# Patient Record
Sex: Male | Born: 1980 | Race: White | Marital: Single | Smoking: Never smoker
Health system: Southern US, Community
[De-identification: ages and names within clinical notes are randomized; demographics above are authoritative.]

## PROBLEM LIST (undated history)

## (undated) DIAGNOSIS — I10 Essential (primary) hypertension: Secondary | ICD-10-CM

## (undated) DIAGNOSIS — G834 Cauda equina syndrome: Secondary | ICD-10-CM

---

## 2011-11-20 ENCOUNTER — Other Ambulatory Visit: Payer: Self-pay | Admitting: Family Medicine

## 2011-11-25 ENCOUNTER — Ambulatory Visit
Admission: RE | Admit: 2011-11-25 | Discharge: 2011-11-25 | Disposition: A | Discharge status: Home / Self Care | Payer: 59 | Source: Ambulatory Visit | Attending: Family Medicine | Admitting: Family Medicine

## 2013-07-08 ENCOUNTER — Emergency Department (HOSPITAL_COMMUNITY): Payer: 59

## 2013-07-08 ENCOUNTER — Emergency Department (HOSPITAL_COMMUNITY)
Admission: EM | Admit: 2013-07-08 | Discharge: 2013-07-08 | Disposition: A | Discharge status: Home / Self Care | Payer: 59 | Attending: Emergency Medicine | Admitting: Emergency Medicine

## 2013-07-08 ENCOUNTER — Encounter (HOSPITAL_COMMUNITY): Payer: Self-pay | Admitting: Emergency Medicine

## 2013-07-08 DIAGNOSIS — N50811 Right testicular pain: Secondary | ICD-10-CM

## 2013-07-08 DIAGNOSIS — Z79899 Other long term (current) drug therapy: Secondary | ICD-10-CM | POA: Insufficient documentation

## 2013-07-08 DIAGNOSIS — R319 Hematuria, unspecified: Secondary | ICD-10-CM | POA: Insufficient documentation

## 2013-07-08 DIAGNOSIS — N509 Disorder of male genital organs, unspecified: Secondary | ICD-10-CM | POA: Insufficient documentation

## 2013-07-08 DIAGNOSIS — I10 Essential (primary) hypertension: Secondary | ICD-10-CM | POA: Insufficient documentation

## 2013-07-08 HISTORY — DX: Essential (primary) hypertension: I10

## 2013-07-08 LAB — COMPREHENSIVE METABOLIC PANEL
ALT: 46 U/L (ref 0–53)
AST: 44 U/L — ABNORMAL HIGH (ref 0–37)
Albumin: 4.6 g/dL (ref 3.5–5.2)
Alkaline Phosphatase: 77 U/L (ref 39–117)
BUN: 14 mg/dL (ref 6–23)
CALCIUM: 10.1 mg/dL (ref 8.4–10.5)
CO2: 23 meq/L (ref 19–32)
CREATININE: 1.21 mg/dL (ref 0.50–1.35)
Chloride: 99 mEq/L (ref 96–112)
GFR, EST AFRICAN AMERICAN: 89 mL/min — AB (ref >90)
GFR, EST NON AFRICAN AMERICAN: 77 mL/min — AB (ref >90)
GLUCOSE: 88 mg/dL (ref 70–99)
Potassium: 4.5 mEq/L (ref 3.7–5.3)
Sodium: 137 mEq/L (ref 137–147)
Total Bilirubin: 0.5 mg/dL (ref 0.3–1.2)
Total Protein: 8.3 g/dL (ref 6.0–8.3)

## 2013-07-08 LAB — URINALYSIS, ROUTINE W REFLEX MICROSCOPIC
Bilirubin Urine: NEGATIVE
Glucose, UA: NEGATIVE mg/dL
Ketones, ur: NEGATIVE mg/dL
NITRITE: NEGATIVE
Protein, ur: 30 mg/dL — AB
SPECIFIC GRAVITY, URINE: 1.028 (ref 1.005–1.030)
UROBILINOGEN UA: 0.2 mg/dL (ref 0.0–1.0)
pH: 5.5 (ref 5.0–8.0)

## 2013-07-08 LAB — CBC WITH DIFFERENTIAL/PLATELET
BASOS ABS: 0 10*3/uL (ref 0.0–0.1)
Basophils Relative: 0 % (ref 0–1)
EOS PCT: 1 % (ref 0–5)
Eosinophils Absolute: 0.1 10*3/uL (ref 0.0–0.7)
HEMATOCRIT: 41.8 % (ref 39.0–52.0)
Hemoglobin: 14.1 g/dL (ref 13.0–17.0)
LYMPHS ABS: 3.5 10*3/uL (ref 0.7–4.0)
LYMPHS PCT: 39 % (ref 12–46)
MCH: 28.5 pg (ref 26.0–34.0)
MCHC: 33.7 g/dL (ref 30.0–36.0)
MCV: 84.4 fL (ref 78.0–100.0)
MONO ABS: 0.7 10*3/uL (ref 0.1–1.0)
Monocytes Relative: 8 % (ref 3–12)
Neutro Abs: 4.6 10*3/uL (ref 1.7–7.7)
Neutrophils Relative %: 52 % (ref 43–77)
Platelets: 288 10*3/uL (ref 150–400)
RBC: 4.95 MIL/uL (ref 4.22–5.81)
RDW: 13.3 % (ref 11.5–15.5)
WBC: 8.9 10*3/uL (ref 4.0–10.5)

## 2013-07-08 LAB — URINE MICROSCOPIC-ADD ON

## 2013-07-08 MED ORDER — OXYCODONE-ACETAMINOPHEN 5-325 MG PO TABS: 1.0000 | ORAL_TABLET | Freq: Once | ORAL | Status: DC

## 2013-07-08 MED ORDER — ONDANSETRON 8 MG PO TBDP
8.0000 mg | ORAL_TABLET | Freq: Once | ORAL | Status: AC
  Administered 2013-07-08: 8 mg via ORAL
  Filled 2013-07-08: qty 1

## 2013-07-08 NOTE — ED Provider Notes (Signed)
CSN: [id number]     Arrival date & time 07/08/13  1244 History   First MD Initiated Contact with Patient 07/08/13 1333     Chief Complaint  Patient presents with  . Flank Pain     (Consider location/radiation/quality/duration/timing/severity/associated sxs/prior Treatment) HPI Comments: 33 year old male presents with acute right groin pain. He states started several hours prior to arrival and then acutely got worse. He was in on bearable 10 out of 10 prior to arrival and improved to the point where he only has a 2/10 mild discomfort at this time. He states for the past several months he's been having intermittent right groin/testicular pain. He feels like his testicle might be a little bit swollen. Denies any dysuria or hematuria. He's never had this evaluated by a doctor. He feels like the pain radiated up from his groin to his right back.   Past Medical History  Diagnosis Date  . Hypertension    History reviewed. No pertinent past surgical history. No family history on file. History  Substance Use Topics  . Smoking status: Never Smoker   . Smokeless tobacco: Never Used  . Alcohol Use: Yes     Comment: Socail     Review of Systems  Constitutional: Negative for fever.  Gastrointestinal: Negative for vomiting and abdominal pain.  Genitourinary: Positive for flank pain and testicular pain. Negative for hematuria and discharge.       Had trouble urinating  All other systems reviewed and are negative.      Allergies  Review of patient's allergies indicates no known allergies.  Home Medications   Current Outpatient Rx  Name  Route  Sig  Dispense  Refill  . aspirin 325 MG tablet   Oral   Take 325 mg by mouth every 6 (six) hours as needed (pain).         [name]. buPROPion (WELLBUTRIN) 75 MG tablet   Oral   Take 75 mg by mouth 2 (two) times daily.         . clonazePAM (KLONOPIN) 2 MG tablet   Oral   Take 2 mg by mouth at bedtime.         [name]. escitalopram (LEXAPRO) 20 MG  tablet   Oral   Take 20 mg by mouth at bedtime.         [name]. lisdexamfetamine (VYVANSE) 60 MG capsule   Oral   Take 60 mg by mouth every morning.         . nebivolol (BYSTOLIC) 5 MG tablet   Oral   Take 5 mg by mouth at bedtime.          BP 150/90  Pulse 81  Temp(Src) 97.5 F (36.4 C) (Oral)  Resp 22  Ht 5\' 11"  (1.803 m)  Wt 285 lb (129.275 kg)  BMI 39.77 kg/m2  SpO2 94% Physical Exam  Nursing note and vitals reviewed. Constitutional: He is oriented to person, place, and time. He appears well-developed and well-nourished.  HENT:  Head: Normocephalic and atraumatic.  Right Ear: External ear normal.  Left Ear: External ear normal.  Nose: Nose normal.  Eyes: Right eye exhibits no discharge. Left eye exhibits no discharge.  Neck: Neck supple.  Cardiovascular: Normal rate, regular rhythm, normal heart sounds and intact distal pulses.   Pulmonary/Chest: Effort normal.  Abdominal: Soft. He exhibits no distension. There is no tenderness.  Genitourinary: Penis normal. Right testis shows tenderness (mild). Right testis shows no swelling.  Right testicle mildly elevated compared to left  Musculoskeletal: He exhibits no edema.  Neurological: He is alert and oriented to person, place, and time.  Skin: Skin is warm and dry.    ED Course  Procedures (including critical care time) Labs Review Labs Reviewed  COMPREHENSIVE METABOLIC PANEL - Abnormal; Notable for the following:    AST 44 (*)    GFR calc non Af Amer 77 (*)    GFR calc Af Amer 89 (*)    All other components within normal limits  URINALYSIS, ROUTINE W REFLEX MICROSCOPIC - Abnormal; Notable for the following:    Color, Urine AMBER (*)    APPearance CLOUDY (*)    Hgb urine dipstick LARGE (*)    Protein, ur 30 (*)    Leukocytes, UA TRACE (*)    All other components within normal limits  URINE MICROSCOPIC-ADD ON - Abnormal; Notable for the following:    Bacteria, UA MANY (*)    Casts HYALINE CASTS (*)    All  other components within normal limits  CBC WITH DIFFERENTIAL   Imaging Review US Scrotum  07/08/2013   CLINICAL DATA:  Right testicular pain.  EXAM: SCROTAL ULTRASOUND  DOPPLER ULTRASOUND OF THE TESTICLES  TECHNIQUE: Complete ultrasound examination of the testicles, epididymis, and other scrotal structures was performed. Color and spectral Doppler ultrasound were also utilized to evaluate blood flow to the testicles.  COMPARISON:  None.  FINDINGS: Right testicle  Measurements: 3.9 x 1.9 x 2.7 cm. No mass or microlithiasis visualized.  Left testicle  Measurements: 3.7 x 1.7 x 3.1 cm. There are multiple (at least 10), predominantly peripheral echogenic foci within the parenchyma, some solitary others clustered. There is no shadowing. There is no evidence of solid mass. The largest conglomerate measures 3 mm.  Right epididymis:  Normal in size and appearance.  Left epididymis:  Normal in size and appearance.  Hydrocele:  None visualized.  Varicocele: There are enlarged venous structures in the bilateral scrotum which increase with Valsalva, measuring up to 3.5 cm on the left.  Pulsed Doppler interrogation of both testes demonstrates low resistance arterial and venous waveforms bilaterally.  IMPRESSION: 1. No acute scrotal findings. 2. Left testicular microlithiasis. 3. Bilateral varicocele.   Electronically Signed   By: Tiburcio Pea M.D.   On: 07/08/2013 15:53   US Renal  07/08/2013   CLINICAL DATA:  Right flank pain  EXAM: RENAL/URINARY TRACT ULTRASOUND COMPLETE  COMPARISON:  Abdominal ultrasound dated November 25, 2011.  FINDINGS: Right Kidney:  Length: 12.7 cm. Persistent fetal lobation is present. The echotexture of the right kidney is lower than that of the adjacent liver. There is no hydronephrosis or cystic or solid mass.  Left Kidney:  Length: 12.5 cm. Echogenicity within normal limits. No mass or hydronephrosis visualized.  Bladder:  Appears normal for degree of bladder distention.  IMPRESSION: Normal  renal ultrasound examination.   Electronically Signed   By: David  [place]   On: 07/08/2013 15:47   [place] Art/ven Flow Abd Pelv Doppler  07/08/2013   CLINICAL DATA:  Right testicular pain.  EXAM: SCROTAL ULTRASOUND  DOPPLER ULTRASOUND OF THE TESTICLES  TECHNIQUE: Complete ultrasound examination of the testicles, epididymis, and other scrotal structures was performed. Color and spectral Doppler ultrasound were also utilized to evaluate blood flow to the testicles.  COMPARISON:  None.  FINDINGS: Right testicle  Measurements: 3.9 x 1.9 x 2.7 cm. No mass or microlithiasis visualized.  Left testicle  Measurements: 3.7 x 1.7 x 3.1 cm. There are multiple (at least 10), predominantly  peripheral echogenic foci within the parenchyma, some solitary others clustered. There is no shadowing. There is no evidence of solid mass. The largest conglomerate measures 3 mm.  Right epididymis:  Normal in size and appearance.  Left epididymis:  Normal in size and appearance.  Hydrocele:  None visualized.  Varicocele: There are enlarged venous structures in the bilateral scrotum which increase with Valsalva, measuring up to 3.5 cm on the left.  Pulsed Doppler interrogation of both testes demonstrates low resistance arterial and venous waveforms bilaterally.  IMPRESSION: 1. No acute scrotal findings. 2. Left testicular microlithiasis. 3. Bilateral varicocele.   Electronically Signed   By: Tiburcio Pea M.D.   On: 07/08/2013 15:53     EKG Interpretation None      MDM   Final diagnoses:  Right testicular pain  Hematuria    Currently not feeling pain. He states on exam his right testicle has been elevated above his left testicle for several weeks to months. His ultrasound is negative for torsion and his renal ultrasound is negative for hydronephrosis. No stones seen. Given that his pain was so severe it is testicle and intermittent there is concern for possible intermittent torsion. I discussed the case with urologist on call  Dr. Vernie Ammons, who at this time recommend CT to rule out stone as this can be missed on ultrasound. He states he would not treat his urine even though there is bacteria this as there is no inflammation. We'll culture the urine. Plan per Dr. Vernie Ammons is to get a CT stone study, treat with Flomax if there is a stone and if not cannot chew with Flomax but either way have the patient called the urology office tomorrow first thing in the morning. Care transferred to Dr. Jodi Mourning with stone study pending.    Audree Camel, MD 07/08/13 1640

## 2013-07-08 NOTE — ED Notes (Signed)
Pt reports right flank pain, which radiates to his groin. Pt states the pain began a few weeks ago, which suddenly became worse today at 0830. Pt reports the pain now is worse in the groin. Pt states when he attempted to void, he only had a dribble and it was painful. Pt is A/Ox4 and vitals are WDL.

## 2013-07-08 NOTE — Discharge Instructions (Signed)
Follow up with urology If you were given medicines take as directed.  If you are on coumadin or contraceptives realize their levels and effectiveness is altered by many different medicines.  If you have any reaction (rash, tongues swelling, other) to the medicines stop taking and see a physician.   Please follow up as directed and return to the ER or see a physician for new or worsening symptoms.  Thank you. Filed Vitals:   07/08/13 1307  BP: 150/90  Pulse: 81  Temp: 97.5 F (36.4 C)  TempSrc: Oral  Resp: 22  Height: 5\' 11"  (1.803 m)  Weight: 285 lb (129.275 kg)  SpO2: 94%

## 2013-07-09 LAB — URINE CULTURE
COLONY COUNT: NO GROWTH
Culture: NO GROWTH

## 2013-12-01 ENCOUNTER — Other Ambulatory Visit: Payer: Self-pay | Admitting: Family Medicine

## 2013-12-01 DIAGNOSIS — R7989 Other specified abnormal findings of blood chemistry: Secondary | ICD-10-CM

## 2013-12-03 ENCOUNTER — Ambulatory Visit
Admission: RE | Admit: 2013-12-03 | Discharge: 2013-12-03 | Disposition: A | Discharge status: Home / Self Care | Payer: 59 | Source: Ambulatory Visit | Attending: Family Medicine | Admitting: Family Medicine

## 2013-12-03 DIAGNOSIS — R7989 Other specified abnormal findings of blood chemistry: Secondary | ICD-10-CM

## 2017-02-19 NOTE — Progress Notes (Signed)
Psychiatric Initial Adult Assessment   Patient Identification: Jon Beck MRN:  [id number] Date of Evaluation:  02/20/2017 Referral Source: Self Chief Complaint:   Chief Complaint    Depression; Anxiety; Psychiatric Evaluation     Visit Diagnosis:    ICD-10-CM   1. MDD (major depressive disorder), recurrent episode, moderate (HCC) F33.1   2. Attention deficit hyperactivity disorder (ADHD), unspecified ADHD type F90.9     History of Present Illness:   Jon Beck is a 36 year old male with depression, anxiety, ADD, hypertension, cholesterol, hypothyroidism, obesity, who is referred for depression.   Patient states that he is here for worsening depression and anxiety for the past 6 months.  He feels anxious all the time and has panic attacks.  He talks about an episode when he was driving a car; he heard squeezing sound in the car and he needed to pull over his car because of intense anxiety.  He also notices that he tends to be irritable and snapped at his fianc.  He snapped at her when he had panic attack when he could not return the boat back to its place. He tends to withdraw, although he used to enjoy talking with people at work.  He would like to go back to who he was, and he would like to be off his medication so that he will not be "dependent."  Although he could not think of any triggers,  he states that he is wondering about his career advancement in his job. He feels that he "should not" feel this way as he is having "good life." He reports good relationship with his fiance of ten years, and reports having good job he used to enjoy. He also feels tired of being asked by people that "something is wrong" with him.   He reports insomnia. He has appetite loss. He feels fatigue and depressed.  He has very low energy and has anhedonia.  He has passive SI at times.  He denies HI. He denies decreased need for sleep or euphoria.  He feels anxious and has panic attacks.  He rarely  drinks alcohol, stating that he tends to do binge drinking, although he never had any regular use before. He denies drug use. He had note taken Vyvanse as he did not like the way he felt. He feels Mydayis to be "milder." He reports significant inattention when he was off Vyvanse, although he denies withdrawal symptoms. He takes clonazepam 2 mg at night; he had withdrawal symptoms when he was off for a few days.   Per PMP, (most recent record) Mydayis ER 37.5 mg 30 tabs for 30 days, filled on 10/5 Vyvanse 70 mg daily, 90 tabs for 90 days, filled on 9/24 Clonazepam 2 mg 90 tabs for 90 days, filled on 9/14   Associated Signs/Symptoms: Depression Symptoms:  depressed mood, anhedonia, insomnia, fatigue, hopelessness, anxiety, panic attacks, (Hypo) Manic Symptoms:  denies Anxiety Symptoms:  Excessive Worry, Panic Symptoms, Psychotic Symptoms:  denies PTSD Symptoms: NA  Past Psychiatric History:  Outpatient: depression, anxiety, at age 21 after college, diagnosed with ADHD age 83 (reports he had a test),  Psychiatry admission:  Previous suicide attempt: denies  Past trials of medication: sertraline, Effexor (had "zaps"), Wellbutrin, Adderall, Adderall XR, Vyvanse, Strattera,  History of violence: denies  Previous Psychotropic Medications: Yes   Substance Abuse History in the last 12 months:  No.  Consequences of Substance Abuse: NA  Past Medical History:  Past Medical History:  Diagnosis Date  .  Hypertension    No past surgical history on file.  Family Psychiatric History:  Brother- opioid abuse (in remission)  Family History: No family history on file.  Social History:   Social History   Social History  . Marital status: Single    Spouse name: N/A  . Number of children: N/A  . Years of education: N/A   Social History Main Topics  . Smoking status: Never Smoker  . Smokeless tobacco: Never Used  . Alcohol use Yes     Comment: Socail   . Drug use: No  . Sexual  activity: Not Asked   Other Topics Concern  . None   Social History Narrative  . None    Additional Social History:  Lives with his fiance He was born in [place], [place] and grew up in [place]. He reports good relationship with his parents and his brother. He talks with his mother every day.  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 Amphet-Dextroamphet 3-Bead ER (MYDAYIS) 37.5 MG CP24 Take 37.5 mg by mouth every morning.    [name] buPROPion (WELLBUTRIN SR) 150 MG 12 hr tablet Take 150 mg by mouth 2 (two) times daily.    . clonazePAM (KLONOPIN) 2 MG tablet Take 2 mg by mouth at bedtime.    [name] ibuprofen (ADVIL,MOTRIN) 200 MG tablet Take 200 mg by mouth 3 (three) times daily as needed.    [name] levothyroxine (SYNTHROID, LEVOTHROID) 75 MCG tablet Take 75 mcg by mouth daily before breakfast.    . nebivolol (BYSTOLIC) 10 MG tablet Take 10 mg by mouth daily.    . sertraline (ZOLOFT) 100 MG tablet 50 mg daily for one week, then 100 mg daily 30 tablet 0   No current facility-administered medications for this visit.     Neurologic: Headache: No Seizure: No Paresthesias:No  Musculoskeletal: Strength & Muscle Tone: within normal limits Gait & Station: normal Patient leans: N/A  Psychiatric Specialty Exam: Review of Systems  Psychiatric/Behavioral: Positive for depression and suicidal ideas. Negative for hallucinations and substance abuse. The patient is nervous/anxious and has insomnia.   All other systems reviewed and are negative.   Blood pressure 137/87, pulse 88, height 5\' 11"  (1.803 m), weight 276 lb (125.2 kg).Body mass index is 38.49  kg/m.  General Appearance: Fairly Groomed  Slightly leaning forward, becoming a little tearful when talking about guilt   Eye Contact:  Good  Speech:  Clear and Coherent  Volume:  Normal  Mood:  Anxious and Depressed  Affect:  Appropriate, Congruent, Restricted, Tearful and tense  Thought Process:  Coherent and Goal Directed  Orientation:  Full (Time, Place, and Person)  Thought Content:  Logical Perceptions: denies AH/VH  Suicidal Thoughts:  Yes.  without intent/plan  Homicidal Thoughts:  No  Memory:  Immediate;   Good Recent;   Good Remote;   Good  Judgement:  Good  Insight:  Fair  Psychomotor Activity:  Normal  Concentration:  Concentration: Good and Attention Span: Good  Recall:  Good  Fund of Knowledge:Good  Language: Good  Akathisia:  No  Handed:  Right  AIMS (if indicated):  N/A  Assets:  Communication Skills Desire for Improvement  ADL's:  Intact  Cognition: WNL  Sleep:  poor   Assessment Melinda Crutchshley Solomon is a 36 year old male with depression, anxiety, ADD, hypertension, cholesterol, hypothyroidism, obesity, who is referred for depression.   # MDD, moderate, recurrent with anxious distress # r/o panic disorder Patient endorses neurovegetative symptoms which worsened over the past several months. Although he could not identify any triggers, he reports ambivalence in his career. Will cross taper from lexapro to sertraline. Will try from higher dose given his body habitus. Discussed side effect which includes nausea and vomiting. Also discussed serotonin syndrome. He is advised to contact the clinic if any concern about medication. Will continue Wellbutrin at the current dose as adjunctive treatment for depression. Will consider uptitration in the future to target residual symptoms. He will continue clonazepam, prescribed by his PCP. Discussed self compassion and behavioral activation. He will greatly benefit from CBT; referral is made.  # ADHD Patient reports history of  ADHD since age 36.  He received some psychological test according to his report.  Discussed with patient about potential side effect of worsening anxiety, which she experiences.  His medications will be continued by his PCP.    Plan 1. Continue Wellbutrin 150 mg twice a day 2. Decrease lexapro 10 mg daily for one week, then discontinue  3. Start sertraline 50 mg daily for one week, then 100 mg daily 4. Continue clonazepam 2 mg at night  5. Continue Mydayis ER 37.5 mg 30 tabs for 30 day (he will ask this medication to be prescribed by PCP) 6. Return to clinic in one month for 30 mins 7. Referral to therapy   The patient demonstrates the following risk factors for suicide: Chronic risk factors for suicide include: psychiatric disorder of depression. Acute risk factors for suicide include: N/A. Protective factors for this patient include: positive social support, coping skills and hope for the future. Considering these factors, the overall suicide risk at this point appears to be low. Patient is appropriate for outpatient follow up.   Treatment Plan Summary: Plan as above   Neysa Hottereina Moo Gravley, MD 11/1/20183:51 PM

## 2017-02-20 ENCOUNTER — Encounter (HOSPITAL_COMMUNITY): Payer: Self-pay | Admitting: Psychiatry

## 2017-02-20 ENCOUNTER — Ambulatory Visit (INDEPENDENT_AMBULATORY_CARE_PROVIDER_SITE_OTHER): Payer: 59 | Admitting: Psychiatry

## 2017-02-20 VITALS — BP 137/87 | HR 88 | Ht 71.0 in | Wt 276.0 lb

## 2017-02-20 DIAGNOSIS — F331 Major depressive disorder, recurrent, moderate: Secondary | ICD-10-CM | POA: Diagnosis not present

## 2017-02-20 DIAGNOSIS — F909 Attention-deficit hyperactivity disorder, unspecified type: Secondary | ICD-10-CM | POA: Diagnosis not present

## 2017-02-20 MED ORDER — SERTRALINE HCL 100 MG PO TABS: ORAL_TABLET | ORAL | 0 refills | Status: DC

## 2017-02-20 NOTE — Patient Instructions (Addendum)
1. Continue wellbutrin 150 mg twice a day 2. Decrease 10 mg daily for one week, then discontinue  3. Start sertraline 50 mg daily for one week, then 100 mg daily 4. Continue clonazepam 2 mg at night  5. Continue Mydayis ER 37.5 mg daily 6. Return to clinic in one month for 30 mins 7. Referral to therapy

## 2017-03-18 NOTE — Progress Notes (Deleted)
BH MD/PA/NP OP Progress Note  03/18/2017 2:40 PM Jon Beck  MRN:  [id number]  Chief Complaint:  HPI: *** Visit Diagnosis: No diagnosis found.  Past Psychiatric History:  I have reviewed the patient's psychiatry history in detail and updated the patient record. Outpatient: depression, anxiety, at age 36 after college, diagnosed with ADHD age 36 (reports he had a test),  Psychiatry admission:  Previous suicide attempt: denies  Past trials of medication: sertraline, Effexor (had "zaps"), Wellbutrin, Adderall, Adderall XR, Vyvanse, Strattera,  History of violence: denies    Past Medical History:  Past Medical History:  Diagnosis Date  . Hypertension    No past surgical history on file.  Family Psychiatric History: I have reviewed the patient's family history in detail and updated the patient record. Brother- opioid abuse (in remission)    Family History: No family history on file.  Social History:  Social History   Socioeconomic History  . Marital status: Single    Spouse name: Not on file  . Number of children: Not on file  . Years of education: Not on file  . Highest education level: Not on file  Social Needs  . Financial resource strain: Not on file  . Food insecurity - worry: Not on file  . Food insecurity - inability: Not on file  . Transportation needs - medical: Not on file  . Transportation needs - non-medical: Not on file  Occupational History  . Not on file  Tobacco Use  . Smoking status: Never Smoker  . Smokeless tobacco: Never Used  Substance and Sexual Activity  . Alcohol use: Yes    Comment: Socail   . Drug use: No  . Sexual activity: Not on file  Other Topics Concern  . Not on file  Social History Narrative  . Not on file    Allergies: No Known Allergies  Metabolic Disorder Labs: No results found for: HGBA1C, MPG No results found for: PROLACTIN No results found for: CHOL, TRIG, HDL, CHOLHDL, VLDL, LDLCALC No results found for:  TSH  Therapeutic Level Labs: No results found for: LITHIUM No results found for: VALPROATE No components found for:  CBMZ  Current Medications: Current Outpatient Medications  Medication Sig Dispense Refill  . Amphet-Dextroamphet 3-Bead ER (MYDAYIS) 37.5 MG CP24 Take 37.5 mg by mouth every morning.    [name]. buPROPion (WELLBUTRIN SR) 150 MG 12 hr tablet Take 150 mg by mouth 2 (two) times daily.    . clonazePAM (KLONOPIN) 2 MG tablet Take 2 mg by mouth at bedtime.    [name]. ibuprofen (ADVIL,MOTRIN) 200 MG tablet Take 200 mg by mouth 3 (three) times daily as needed.    [name]. levothyroxine (SYNTHROID, LEVOTHROID) 75 MCG tablet Take 75 mcg by mouth daily before breakfast.    . nebivolol (BYSTOLIC) 10 MG tablet Take 10 mg by mouth daily.    . sertraline (ZOLOFT) 100 MG tablet 50 mg daily for one week, then 100 mg daily 30 tablet 0   No current facility-administered medications for this visit.      Musculoskeletal: Strength & Muscle Tone: within normal limits Gait & Station: normal Patient leans: N/A  Psychiatric Specialty Exam: ROS  There were no vitals taken for this visit.There is no height or weight on file to calculate BMI.  General Appearance: Fairly Groomed  Eye Contact:  Good  Speech:  Clear and Coherent  Volume:  Normal  Mood:  {BHH MOOD:22306}  Affect:  {Affect (PAA):22687}  Thought Process:  Coherent and Goal Directed  Orientation:  Full (Time, Place, and Person)  Thought Content: Logical   Suicidal Thoughts:  {ST/HT (PAA):22692}  Homicidal Thoughts:  {ST/HT (PAA):22692}  Memory:  Immediate;   Good Recent;   Good Remote;   Good  Judgement:  {Judgement (PAA):22694}  Insight:  {Insight (PAA):22695}  Psychomotor Activity:  Normal  Concentration:  Concentration: Good and Attention Span: Good  Recall:  Good  Fund of Knowledge: Good  Language: Good  Akathisia:  No  Handed:  Right  AIMS (if indicated): not done  Assets:  Communication Skills Desire for Improvement  ADL's:   Intact  Cognition: WNL  Sleep:  {BHH GOOD/FAIR/POOR:22877}   Screenings:   Assessment and Plan:  Jon Beck is a 36 y.o. year old male with a history of depression, ADHD, hypertension,  cholesterol, hypothyroidism, obesity, , who presents for follow up appointment for No diagnosis found.  # MDD, moderate, recurrent with anxious distress # r/o panic disorder  Patient endorses neurovegetative symptoms which worsened over the past several months. Although he could not identify any triggers, he reports ambivalence in his career. Will cross taper from lexapro to sertraline. Will try from higher dose given his body habitus. Discussed side effect which includes nausea and vomiting. Also discussed serotonin syndrome. He is advised to contact the clinic if any concern about medication. Will continue Wellbutrin at the current dose as adjunctive treatment for depression. Will consider uptitration in the future to target residual symptoms. He will continue clonazepam, prescribed by his PCP. Discussed self compassion and behavioral activation. He will greatly benefit from CBT; referral is made.  # ADHD Patient reports history of ADHD since age 36.  He received some psychological test according to his report.  Discussed with patient about potential side effect of worsening anxiety, which she experiences.  His medications will be continued by his PCP.    Plan 1. Continue Wellbutrin 150 mg twice a day 2. Decrease lexapro 10 mg daily for one week, then discontinue  3. Start sertraline 50 mg daily for one week, then 100 mg daily 4. Continue clonazepam 2 mg at night  5. Continue Mydayis ER 37.5 mg 30 tabs for 30 day (he will ask this medication to be prescribed by PCP) 6. Return to clinic in one month for 30 mins 7. Referral to therapy   The patient demonstrates the following risk factors for suicide: Chronic risk factors for suicide include: psychiatric disorder of depression. Acute risk factors  for suicide include: N/A. Protective factors for this patient include: positive social support, coping skills and hope for the future. Considering these factors, the overall suicide risk at this point appears to be low. Patient is appropriate for outpatient follow up.     Jon Hottereina Taelyr Jantz, MD 03/18/2017, 2:40 PM

## 2017-03-19 ENCOUNTER — Other Ambulatory Visit (HOSPITAL_COMMUNITY): Payer: Self-pay | Admitting: Psychiatry

## 2017-03-19 MED ORDER — SERTRALINE HCL 100 MG PO TABS: 100.0000 mg | ORAL_TABLET | Freq: Every day | ORAL | 0 refills | Status: DC

## 2017-03-24 ENCOUNTER — Ambulatory Visit (HOSPITAL_COMMUNITY): Payer: 59 | Admitting: Psychiatry

## 2017-04-03 NOTE — Progress Notes (Signed)
BH MD/PA/NP OP Progress Note  04/08/2017 2:44 PM Jon Beck  MRN:  [id number]  Chief Complaint:  Chief Complaint    Depression; Follow-up     HPI:  Patient presents for follow up appointment for depression.  He states that he feels better since his last appointment.  He takes sertraline at night as it makes him drowsy.  He has been off work for holiday; he believes it also helps for his mood. He will return to work in January; he loves work and he hopes that things go well.  He has been enjoying buying things for his niece. He went to a lake and rode on a boat after a long time. He wakes up with good energy. There are few times he feels depressed. He feels anxious all day long, although he believes that he is able to cope with it better. He occasionally snaps at his fiance when he does not feel good, and immediately apologized to her afterwards. He has increased appetite. He has better concentration. He denies SI. He denies panic attacks. He takes clonazepam every night; he is concerned that he might have withdrawal symptoms as he had when he tries to be off venlafaxine. He signed up for Gym with his fiance and agrees to do regular exercise.   Wt Readings from Last 3 Encounters:  04/08/17 279 lb (126.6 kg)  02/20/17 276 lb (125.2 kg)  07/08/13 285 lb (129.3 kg)    Per PMP,  Clonazepam filled on 01/03/2017 for 90 days,  Mydayis filled on 03/05/2017 I have utilized the [place] Controlled Substances Reporting System (PMP AWARxE) to confirm adherence regarding the patient's medication. My review reveals appropriate prescription fills.   Visit Diagnosis:    ICD-10-CM   1. MDD (major depressive disorder), recurrent episode, moderate (HCC) F33.1     Past Psychiatric History:  I have reviewed the patient's psychiatry history in detail and updated the patient record. Outpatient: depression, anxiety, at age 36 after college, diagnosed with ADHD age 36 (reports he had a test),  Psychiatry  admission:  Previous suicide attempt: denies  Past trials of medication: sertraline, Effexor (had "zaps"), Wellbutrin, Adderall, Adderall XR, Vyvanse, Strattera,  History of violence: denies  Past Medical History:  Past Medical History:  Diagnosis Date  . Hypertension    No past surgical history on file.  Family Psychiatric History:  I have reviewed the patient's family history in detail and updated the patient record. Brother- opioid abuse (in remission)    Family History: No family history on file.  Social History:  Social History   Socioeconomic History  . Marital status: Single    Spouse name: None  . Number of children: None  . Years of education: None  . Highest education level: None  Social Needs  . Financial resource strain: None  . Food insecurity - worry: None  . Food insecurity - inability: None  . Transportation needs - medical: None  . Transportation needs - non-medical: None  Occupational History  . None  Tobacco Use  . Smoking status: Never Smoker  . Smokeless tobacco: Never Used  Substance and Sexual Activity  . Alcohol use: Yes    Comment: Socail   . Drug use: No  . Sexual activity: None  Other Topics Concern  . None  Social History Narrative  . None   Lives with his fiance He was born in [place], [place] and grew up in [place]. He reports good relationship with his parents and his brother. He  talks with his mother every day.  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 Amphet-Dextroamphet 3-Bead ER (MYDAYIS) 37.5 MG CP24 Take 37.5 mg by mouth every morning.    [name] buPROPion (WELLBUTRIN SR) 150 MG 12 hr tablet Take 150 mg by mouth 2 (two) times daily.    . clonazePAM (KLONOPIN) 2 MG tablet Take 2 mg by mouth at bedtime.    [name] ibuprofen (ADVIL,MOTRIN) 200 MG tablet Take 200 mg by mouth 3 (three) times daily as needed.    [name] levothyroxine (SYNTHROID, LEVOTHROID) 75 MCG tablet Take 75 mcg by mouth daily before breakfast.    . nebivolol (BYSTOLIC) 10 MG tablet Take 10 mg by mouth daily.    . sertraline (ZOLOFT) 100 MG tablet Take 1.5 tablets (150 mg total) by mouth at bedtime. 135 tablet 0   No current facility-administered medications for this visit.      Musculoskeletal: Strength & Muscle Tone: within normal limits Gait & Station: normal Patient leans: N/A  Psychiatric Specialty Exam: Review of Systems  Psychiatric/Behavioral: Positive for depression. Negative for hallucinations, memory loss, substance abuse and suicidal ideas. The patient is nervous/anxious. The patient does not have insomnia.   All other systems reviewed and are negative.   Blood pressure 124/83, pulse 68, height 5\' 11"  (1.803 m), weight 279 lb (126.6 kg), SpO2 98 %.Body mass index is 38.91 kg/m.  General Appearance: Fairly Groomed  Eye Contact:  Good  Speech:  Clear and Coherent  Volume:  Normal  Mood:  "better"  Affect:  Appropriate, Congruent and Restricted  Thought Process:  Coherent and Goal Directed  Orientation:  Full (Time, Place, and Person)  Thought Content: Logical Perceptions: denies AH/VH  Suicidal Thoughts:  No  Homicidal Thoughts:  No  Memory:  Immediate;   Good Recent;   Good Remote;   Good  Judgement:  Good  Insight:  Fair  Psychomotor Activity:  Normal  Concentration:  Concentration: Good and Attention Span: Good  Recall:  Good  Fund of Knowledge: Good  Language: Good  Akathisia:  No  Handed:  Right  AIMS (if indicated): not done   Assets:  Communication Skills Desire for Improvement  ADL's:  Intact  Cognition: WNL  Sleep:  Fair   Screenings:   Assessment and Plan:  Keaghan Staton is a 36 y.o. year old male with a history of depression, anxiety, ADD, hypertension,  cholesterol, hypothyroidism, obesity, who presents for follow up appointment for MDD (major depressive disorder), recurrent episode, moderate (HCC)  # MDD, moderate, recurrent with anxious distress Although patient continues to demonstrate restricted affect, he reports significant improvement in his neurovegetative symptoms.  Psychosocial stressors including work, although he has not been able to identify any triggers.  Will up titrate sertraline to target residual symptoms of depression while monitoring weight gain.  Will continue Wellbutrin as adjunctive treatment for depression.  Will continue clonazepam, prescribed by his PCP.  Discussed risk of dependence and oversedation.  He agrees to taper off in the future.  Discussed behavioral activation.  Discussed  self compassion.  He would like to hold the option of seeing the therapist at this time.   # ADHD Per patient Patient reports history of ADHD since age 36.  He had psychological test per his report.  He will continue to take Mydayis, prescribed by his PCP.    Plan I have reviewed and updated plans as below 1. Continue Wellbutrin 150 mg twice a day (prescribed by PCP) 2. Increase sertraline 150 mg daily  3. Return to clinic in one month for 30 mins  4. Continue clonazepam 2 mg at night (prescribed by PCP) 5. Continue Mydayis ER 37.5 mg daily (prescribed by PCP)  The patient demonstrates the following risk factors for suicide: Chronic risk factors for suicide include: psychiatric disorder of depression. Acute risk factors for suicide include: N/A. Protective factors for this patient include: positive social support, coping skills and hope for the future. Considering these factors, the overall  suicide risk at this point appears to be low. Patient is appropriate for outpatient follow up.  The duration of this appointment visit was 30 minutes of face-to-face time with the patient.  Greater than 50% of this time was spent in counseling, explanation of  diagnosis, planning of further management, and coordination of care.  Neysa Hottereina Cadon Raczka, MD 04/08/2017, 2:44 PM

## 2017-04-08 ENCOUNTER — Ambulatory Visit (HOSPITAL_COMMUNITY): Payer: 59 | Admitting: Psychiatry

## 2017-04-08 ENCOUNTER — Encounter (HOSPITAL_COMMUNITY): Payer: Self-pay | Admitting: Psychiatry

## 2017-04-08 VITALS — BP 124/83 | HR 68 | Ht 71.0 in | Wt 279.0 lb

## 2017-04-08 DIAGNOSIS — Z6838 Body mass index (BMI) 38.0-38.9, adult: Secondary | ICD-10-CM

## 2017-04-08 DIAGNOSIS — R450 Nervousness: Secondary | ICD-10-CM | POA: Diagnosis not present

## 2017-04-08 DIAGNOSIS — F331 Major depressive disorder, recurrent, moderate: Secondary | ICD-10-CM

## 2017-04-08 DIAGNOSIS — Z813 Family history of other psychoactive substance abuse and dependence: Secondary | ICD-10-CM

## 2017-04-08 DIAGNOSIS — E669 Obesity, unspecified: Secondary | ICD-10-CM | POA: Diagnosis not present

## 2017-04-08 DIAGNOSIS — F419 Anxiety disorder, unspecified: Secondary | ICD-10-CM

## 2017-04-08 DIAGNOSIS — I10 Essential (primary) hypertension: Secondary | ICD-10-CM | POA: Diagnosis not present

## 2017-04-08 MED ORDER — SERTRALINE HCL 100 MG PO TABS: 150.0000 mg | ORAL_TABLET | Freq: Every day | ORAL | 0 refills | Status: DC

## 2017-04-08 NOTE — Patient Instructions (Signed)
1. Continue Wellbutrin 150 mg twice a day 2. Increase sertraline 150 mg daily  3. Return to clinic in one month for 30 mins

## 2017-05-05 NOTE — Progress Notes (Deleted)
BH MD/PA/NP OP Progress Note  05/05/2017 3:04 PM Jon Beck  MRN:  [id number]  Chief Complaint:  HPI: *** Visit Diagnosis: No diagnosis found.  Past Psychiatric History:  I have reviewed the patient's psychiatry history in detail and updated the patient record. Outpatient:depression, anxiety, at age 37 after college, diagnosed with ADHD age 37 (reports he had a test), Psychiatry admission:  Previous suicide attempt:denies Past trials of medication:sertraline, Effexor (had "zaps"), Wellbutrin, Adderall, Adderall XR, Vyvanse, Strattera, History of violence:denies   Past Medical History:  Past Medical History:  Diagnosis Date  . Hypertension    No past surgical history on file.  Family Psychiatric History: I have reviewed the patient's family history in detail and updated the patient record. Brother- opioid abuse (in remission)   Family History: No family history on file.  Social History:  Social History   Socioeconomic History  . Marital status: Single    Spouse name: Not on file  . Number of children: Not on file  . Years of education: Not on file  . Highest education level: Not on file  Social Needs  . Financial resource strain: Not on file  . Food insecurity - worry: Not on file  . Food insecurity - inability: Not on file  . Transportation needs - medical: Not on file  . Transportation needs - non-medical: Not on file  Occupational History  . Not on file  Tobacco Use  . Smoking status: Never Smoker  . Smokeless tobacco: Never Used  Substance and Sexual Activity  . Alcohol use: Yes    Comment: Socail   . Drug use: No  . Sexual activity: Not on file  Other Topics Concern  . Not on file  Social History Narrative  . Not on file    Allergies: No Known Allergies  Metabolic Disorder Labs: No results found for: HGBA1C, MPG No results found for: PROLACTIN No results found for: CHOL, TRIG, HDL, CHOLHDL, VLDL, LDLCALC No results found for:  TSH  Therapeutic Level Labs: No results found for: LITHIUM No results found for: VALPROATE No components found for:  CBMZ  Current Medications: Current Outpatient Medications  Medication Sig Dispense Refill  . Amphet-Dextroamphet 3-Bead ER (MYDAYIS) 37.5 MG CP24 Take 37.5 mg by mouth every morning.    [name]. buPROPion (WELLBUTRIN SR) 150 MG 12 hr tablet Take 150 mg by mouth 2 (two) times daily.    . clonazePAM (KLONOPIN) 2 MG tablet Take 2 mg by mouth at bedtime.    [name]. ibuprofen (ADVIL,MOTRIN) 200 MG tablet Take 200 mg by mouth 3 (three) times daily as needed.    [name]. levothyroxine (SYNTHROID, LEVOTHROID) 75 MCG tablet Take 75 mcg by mouth daily before breakfast.    . nebivolol (BYSTOLIC) 10 MG tablet Take 10 mg by mouth daily.    . sertraline (ZOLOFT) 100 MG tablet Take 1.5 tablets (150 mg total) by mouth at bedtime. 135 tablet 0   No current facility-administered medications for this visit.      Musculoskeletal: Strength & Muscle Tone: within normal limits Gait & Station: normal Patient leans: N/A  Psychiatric Specialty Exam: ROS  There were no vitals taken for this visit.There is no height or weight on file to calculate BMI.  General Appearance: Fairly Groomed  Eye Contact:  Good  Speech:  Clear and Coherent  Volume:  Normal  Mood:  {BHH MOOD:22306}  Affect:  {Affect (PAA):22687}  Thought Process:  Coherent and Goal Directed  Orientation:  Full (Time, Place, and Person)  Thought Content: Logical   Suicidal Thoughts:  {ST/HT (PAA):22692}  Homicidal Thoughts:  {ST/HT (PAA):22692}  Memory:  Immediate;   Good Recent;   Good Remote;   Good  Judgement:  {Judgement (PAA):22694}  Insight:  {Insight (PAA):22695}  Psychomotor Activity:  Normal  Concentration:  Concentration: Good and Attention Span: Good  Recall:  Good  Fund of Knowledge: Good  Language: Good  Akathisia:  No  Handed:  Right  AIMS (if indicated): not done  Assets:  Communication Skills Desire for Improvement   ADL's:  Intact  Cognition: WNL  Sleep:  {BHH GOOD/FAIR/POOR:22877}   Screenings:   Assessment and Plan:  Jon Beck is a 37 y.o. year old male with a history of depression, ADHD, hypertension, cholesterol, hypothyroidism, obesity , who presents for follow up appointment for No diagnosis found.  # MDD, moderate, recurrent with anxious distress  Although patient continues to demonstrate restricted affect, he reports significant improvement in his neurovegetative symptoms.  Psychosocial stressors including work, although he has not been able to identify any triggers.  Will up titrate sertraline to target residual symptoms of depression while monitoring weight gain.  Will continue Wellbutrin as adjunctive treatment for depression.  Will continue clonazepam, prescribed by his PCP.  Discussed risk of dependence and oversedation.  He agrees to taper off in the future.  Discussed behavioral activation.  Discussed self compassion.  He would like to hold the option of seeing the therapist at this time.   # ADHD Per patient Patient reports history of ADHD since age 37.  He had psychological test per his report.  He will continue to take Mydayis, prescribed by his PCP.    Plan  1. Continue Wellbutrin 150 mg twice a day (prescribed by PCP) 2. Increase sertraline 150 mg daily  3. Return to clinic in one month for 30 mins 4. Continue clonazepam 2 mg at night (prescribed by PCP) 5. Continue Mydayis ER 37.5 mg daily (prescribed by PCP)  The patient demonstrates the following risk factors for suicide: Chronic risk factors for suicide include:psychiatric disorder ofdepression. Acute risk factorsfor suicide include: N/A. Protective factorsfor this patient include: positive social support, coping skills and hope for the future. Considering these factors, the overall suicide risk at this point appears to below. Patientisappropriate for outpatient follow up.    [name], MD 05/05/2017,  3:04 PM

## 2017-05-08 ENCOUNTER — Ambulatory Visit (HOSPITAL_COMMUNITY): Payer: Self-pay | Admitting: Psychiatry

## 2017-05-12 NOTE — Progress Notes (Signed)
BH MD/PA/NP OP Progress Note  05/15/2017 1:21 PM Jon Beck  MRN:  [id number]  Chief Complaint:  Chief Complaint    Depression; Anxiety; Follow-up     HPI:  Patient presents for follow-up appointment for depression.  He states that he has been more depressed over a couple of weeks, although he used to be doing better for the past 3 months.  Although he used to go to gym regularly, he now tends to stay in the bed. He would bring his computer to do work rather than going to home office. He states that he is behind at work as he has not been doing things as he is supposed to. He has no idea why he feels this way. His girlfriend also notices that he tends to be isolated and more irritable.he reports hypersomnia. He feels fatigue and has anhedonia. He started to eat unhealthy food. Although he lost his weight, his ideal weigh it around 220 lbs (used to be 206 lbs in 2010). He has fair concentration.  He denies SI.  He denies feeling tense or anxious.  He denies panic attacks.  He takes clonazepam every night.   Wt Readings from Last 3 Encounters:  05/15/17 265 lb (120.2 kg)  04/08/17 279 lb (126.6 kg)  02/20/17 276 lb (125.2 kg)    Visit Diagnosis:    ICD-10-CM   1. MDD (major depressive disorder), recurrent episode, moderate (HCC) F33.1     Past Psychiatric History:  I have reviewed the patient's psychiatry history in detail and updated the patient record. Outpatient:depression, anxiety, at age 37 after college, diagnosed with ADHD age 37 (reports he had a test), Psychiatry admission:  Previous suicide attempt:denies Past trials of medication:sertraline, Effexor (had "zaps"), Wellbutrin, Adderall, Adderall XR, Vyvanse, Strattera, History of violence:denies  Past Medical History:  Past Medical History:  Diagnosis Date  . Hypertension    No past surgical history on file.  Family Psychiatric History: I have reviewed the patient's family history in detail and updated the  patient record. Brother- opioid abuse (in remission)   Family History: No family history on file.  Social History:  Social History   Socioeconomic History  . Marital status: Single    Spouse name: None  . Number of children: None  . Years of education: None  . Highest education level: None  Social Needs  . Financial resource strain: None  . Food insecurity - worry: None  . Food insecurity - inability: None  . Transportation needs - medical: None  . Transportation needs - non-medical: None  Occupational History  . None  Tobacco Use  . Smoking status: Never Smoker  . Smokeless tobacco: Never Used  Substance and Sexual Activity  . Alcohol use: Yes    Comment: Socail   . Drug use: No  . Sexual activity: None  Other Topics Concern  . None  Social History Narrative  . None    Allergies: No Known Allergies  Metabolic Disorder Labs: No results found for: HGBA1C, MPG No results found for: PROLACTIN No results found for: CHOL, TRIG, HDL, CHOLHDL, VLDL, LDLCALC No results found for: TSH  Therapeutic Level Labs: No results found for: LITHIUM No results found for: VALPROATE No components found for:  CBMZ  Current Medications: Current Outpatient Medications  Medication Sig Dispense Refill  . Amphet-Dextroamphet 3-Bead ER (MYDAYIS) 37.5 MG CP24 Take 37.5 mg by mouth every morning.    . clonazePAM (KLONOPIN) 2 MG tablet Take 2 mg by mouth at bedtime.    .[name]  ibuprofen (ADVIL,MOTRIN) 200 MG tablet Take 200 mg by mouth 3 (three) times daily as needed.    [name] levothyroxine (SYNTHROID, LEVOTHROID) 75 MCG tablet Take 75 mcg by mouth daily before breakfast.    . nebivolol (BYSTOLIC) 10 MG tablet Take 10 mg by mouth daily.    . sertraline (ZOLOFT) 100 MG tablet Take 1.5 tablets (150 mg total) by mouth at bedtime. 135 tablet 0  . buPROPion (WELLBUTRIN XL) 150 MG 24 hr tablet Take total of 450 mg daily (300 mg + 150 mg ) 90 tablet 0  . buPROPion (WELLBUTRIN XL) 300 MG 24 hr tablet Take  total of 450 mg daily (300 mg + 150 mg ) 90 tablet 0   No current facility-administered medications for this visit.      Musculoskeletal: Strength & Muscle Tone: within normal limits Gait & Station: normal Patient leans: N/A  Psychiatric Specialty Exam: Review of Systems  Psychiatric/Behavioral: Positive for depression. Negative for hallucinations, memory loss, substance abuse and suicidal ideas. The patient is nervous/anxious and has insomnia.   All other systems reviewed and are negative.   Blood pressure 105/71, pulse 77, height 5\' 11"  (1.803 m), weight 265 lb (120.2 kg), SpO2 97 %.Body mass index is 36.96 kg/m.  General Appearance: Fairly Groomed  Eye Contact:  Good  Speech:  Clear and Coherent  Volume:  Normal  Mood:  Depressed  Affect:  Appropriate, Congruent, Restricted and down  Thought Process:  Coherent and Goal Directed  Orientation:  Full (Time, Place, and Person)  Thought Content: Logical   Suicidal Thoughts:  No  Homicidal Thoughts:  No  Memory:  Immediate;   Good Recent;   Good Remote;   Good  Judgement:  Good  Insight:  Fair  Psychomotor Activity:  Normal  Concentration:  Concentration: Good and Attention Span: Good  Recall:  Good  Fund of Knowledge: Good  Language: Good  Akathisia:  No  Handed:  Right  AIMS (if indicated): not done  Assets:  Communication Skills Desire for Improvement  ADL's:  Intact  Cognition: WNL  Sleep:  Poor   Screenings:   Assessment and Plan:  Jon Beck is a 37 y.o. year old male with a history of depression, anxiety, ADD, hypertension, cholesterol, hypothyroidism, obesity , who presents for follow up appointment for MDD (major depressive disorder), recurrent episode, moderate (HCC)  # MDD, moderate, recurrent with anxious distress Patient reports worsening neurovegetative symptoms without significant triggers.  Will continue sertraline to target depression.  Will uptitrate Wellbutrin as adjunctive treatment for  depression. Will switch to extended formula. Patient has no known history of seizure or headache. Provided psycho education; he agrees to stay on medication at least for several months to avoid any relapse.  He is on clonazepam, prescribed by PCP.  Discussed risk of dependence and oversedation.  He hopes to taper it off in the future.  Discussed behavioral activation.   # ADHD Patient reports history of ADHD since age 27. He had psychological test, although it is not available. He will continue Mydayis, prescribed by PCP.  Plan I have reviewed and updated plans as below 1. Continue sertraline 150 mg daily  2. Increase Wellbutrin 450 mg daily (150 mg + 300 mg) 3. Return to clinic in one month for 15 mins 4. Continue clonazepam 2 mg at night (prescribed by PCP) 5. Continue Mydayis ER 37.5 mg daily (prescribed by PCP)  The patient demonstrates the following risk factors for suicide: Chronic risk factors for suicide include:psychiatric  disorder ofdepression. Acute risk factorsfor suicide include: N/A. Protective factorsfor this patient include: positive social support, coping skills and hope for the future. Considering these factors, the overall suicide risk at this point appears to below. Patientisappropriate for outpatient follow up.  The duration of this appointment visit was 30 minutes of face-to-face time with the patient.  Greater than 50% of this time was spent in counseling, explanation of  diagnosis, planning of further management, and coordination of care.  [name], MD 05/15/2017, 1:21 PM

## 2017-05-15 ENCOUNTER — Ambulatory Visit (HOSPITAL_COMMUNITY): Payer: 59 | Admitting: Psychiatry

## 2017-05-15 ENCOUNTER — Encounter (HOSPITAL_COMMUNITY): Payer: Self-pay | Admitting: Psychiatry

## 2017-05-15 VITALS — BP 105/71 | HR 77 | Ht 71.0 in | Wt 265.0 lb

## 2017-05-15 DIAGNOSIS — F909 Attention-deficit hyperactivity disorder, unspecified type: Secondary | ICD-10-CM

## 2017-05-15 DIAGNOSIS — F331 Major depressive disorder, recurrent, moderate: Secondary | ICD-10-CM | POA: Diagnosis not present

## 2017-05-15 DIAGNOSIS — G4700 Insomnia, unspecified: Secondary | ICD-10-CM | POA: Diagnosis not present

## 2017-05-15 DIAGNOSIS — Z813 Family history of other psychoactive substance abuse and dependence: Secondary | ICD-10-CM

## 2017-05-15 DIAGNOSIS — R450 Nervousness: Secondary | ICD-10-CM

## 2017-05-15 DIAGNOSIS — F419 Anxiety disorder, unspecified: Secondary | ICD-10-CM

## 2017-05-15 MED ORDER — BUPROPION HCL ER (XL) 300 MG PO TB24: ORAL_TABLET | ORAL | 0 refills | Status: DC

## 2017-05-15 MED ORDER — BUPROPION HCL ER (XL) 150 MG PO TB24: ORAL_TABLET | ORAL | 0 refills | Status: DC

## 2017-05-15 NOTE — Patient Instructions (Signed)
1. Continue sertraline 150 mg daily  2. Increase Wellbutrin 450 mg daily (150 mg + 300 mg) 3. Return to clinic in one month for 15 mins

## 2017-06-15 NOTE — Progress Notes (Deleted)
BH MD/PA/NP OP Progress Note  06/15/2017 10:10 AM Jon Beck  MRN:  [id number]  Chief Complaint:  HPI: *** Visit Diagnosis: No diagnosis found.  Past Psychiatric History:  I have reviewed the patient's psychiatry history in detail and updated the patient record. Outpatient:depression, anxiety, at age 37 after college, diagnosed with ADHD age 37 (reports he had a test), Psychiatry admission:  Previous suicide attempt:denies Past trials of medication:sertraline, Effexor (had "zaps"), Wellbutrin, Adderall, Adderall XR, Vyvanse, Strattera, History of violence:denies    Past Medical History:  Past Medical History:  Diagnosis Date  . Hypertension    No past surgical history on file.  Family Psychiatric History: I have reviewed the patient's family history in detail and updated the patient record. Brother- opioid abuse (in remission)   Family History: No family history on file.  Social History:  Social History   Socioeconomic History  . Marital status: Single    Spouse name: Not on file  . Number of children: Not on file  . Years of education: Not on file  . Highest education level: Not on file  Social Needs  . Financial resource strain: Not on file  . Food insecurity - worry: Not on file  . Food insecurity - inability: Not on file  . Transportation needs - medical: Not on file  . Transportation needs - non-medical: Not on file  Occupational History  . Not on file  Tobacco Use  . Smoking status: Never Smoker  . Smokeless tobacco: Never Used  Substance and Sexual Activity  . Alcohol use: Yes    Comment: Socail   . Drug use: No  . Sexual activity: Not on file  Other Topics Concern  . Not on file  Social History Narrative  . Not on file    Allergies: No Known Allergies  Metabolic Disorder Labs: No results found for: HGBA1C, MPG No results found for: PROLACTIN No results found for: CHOL, TRIG, HDL, CHOLHDL, VLDL, LDLCALC No results found for:  TSH  Therapeutic Level Labs: No results found for: LITHIUM No results found for: VALPROATE No components found for:  CBMZ  Current Medications: Current Outpatient Medications  Medication Sig Dispense Refill  . Amphet-Dextroamphet 3-Bead ER (MYDAYIS) 37.5 MG CP24 Take 37.5 mg by mouth every morning.    [name] buPROPion (WELLBUTRIN XL) 150 MG 24 hr tablet Take total of 450 mg daily (300 mg + 150 mg ) 90 tablet 0  . buPROPion (WELLBUTRIN XL) 300 MG 24 hr tablet Take total of 450 mg daily (300 mg + 150 mg ) 90 tablet 0  . clonazePAM (KLONOPIN) 2 MG tablet Take 2 mg by mouth at bedtime.    [name] ibuprofen (ADVIL,MOTRIN) 200 MG tablet Take 200 mg by mouth 3 (three) times daily as needed.    [name] levothyroxine (SYNTHROID, LEVOTHROID) 75 MCG tablet Take 75 mcg by mouth daily before breakfast.    . nebivolol (BYSTOLIC) 10 MG tablet Take 10 mg by mouth daily.    . sertraline (ZOLOFT) 100 MG tablet Take 1.5 tablets (150 mg total) by mouth at bedtime. 135 tablet 0   No current facility-administered medications for this visit.      Musculoskeletal: Strength & Muscle Tone: within normal limits Gait & Station: normal Patient leans: N/A  Psychiatric Specialty Exam: ROS  There were no vitals taken for this visit.There is no height or weight on file to calculate BMI.  General Appearance: Fairly Groomed  Eye Contact:  Good  Speech:  Clear and Coherent  Volume:  Normal  Mood:  {BHH MOOD:22306}  Affect:  {Affect (PAA):22687}  Thought Process:  Coherent and Goal Directed  Orientation:  Full (Time, Place, and Person)  Thought Content: Logical   Suicidal Thoughts:  {ST/HT (PAA):22692}  Homicidal Thoughts:  {ST/HT (PAA):22692}  Memory:  Immediate;   Good Recent;   Good Remote;   Good  Judgement:  {Judgement (PAA):22694}  Insight:  {Insight (PAA):22695}  Psychomotor Activity:  Normal  Concentration:  Concentration: Good and Attention Span: Good  Recall:  Good  Fund of Knowledge: Good  Language: Fair   Akathisia:  No  Handed:  Right  AIMS (if indicated): not done  Assets:  Communication Skills Desire for Improvement  ADL's:  Intact  Cognition: WNL  Sleep:  {BHH GOOD/FAIR/POOR:22877}   Screenings:   Assessment and Plan:  Jon Beck is a 37 y.o. year old male with a history of depression, ADHD,   hypertension,cholesterol, hypothyroidism, obesity , who presents for follow up appointment for No diagnosis found.  # MDD, moderate, recurrent with anxious distress  Patient reports worsening neurovegetative symptoms without significant triggers.  Will continue sertraline to target depression.  Will uptitrate Wellbutrin as adjunctive treatment for depression. Will switch to extended formula. Patient has no known history of seizure or headache. Provided psycho education; he agrees to stay on medication at least for several months to avoid any relapse.  He is on clonazepam, prescribed by PCP.  Discussed risk of dependence and oversedation.  He hopes to taper it off in the future.  Discussed behavioral activation.   # ADHD Patient reports history of ADHD since age 37. He had psychological test, although it is not available. He will continue Mydayis, prescribed by PCP.  Plan I have reviewed and updated plans as below 1. Continue sertraline 150 mg daily  2. Increase Wellbutrin 450 mg daily (150 mg + 300 mg) 3.Return to clinic in one month for 15 mins 4. Continue clonazepam 2 mg at night(prescribed by PCP) 5. Continue Mydayis ER 37.5 mgdaily (prescribed by PCP)  The patient demonstrates the following risk factors for suicide: Chronic risk factors for suicide include:psychiatric disorder ofdepression. Acute risk factorsfor suicide include: N/A. Protective factorsfor this patient include: positive social support, coping skills and hope for the future. Considering these factors, the overall suicide risk at this point appears to below. Patientisappropriate for outpatient follow  up.     Jon Hottereina Caleyah Jr, MD 06/15/2017, 10:10 AM

## 2017-06-17 ENCOUNTER — Ambulatory Visit (HOSPITAL_COMMUNITY): Payer: 59 | Admitting: Psychiatry

## 2017-07-14 ENCOUNTER — Other Ambulatory Visit (HOSPITAL_COMMUNITY): Payer: Self-pay | Admitting: Psychiatry

## 2017-07-14 MED ORDER — SERTRALINE HCL 100 MG PO TABS: 150.0000 mg | ORAL_TABLET | Freq: Every day | ORAL | 0 refills | Status: DC

## 2017-08-12 NOTE — Progress Notes (Signed)
BH MD/PA/NP OP Progress Note  08/14/2017 10:05 AM Jon Beck  MRN:  [id number]  Chief Complaint:  Chief Complaint    Depression; Follow-up     HPI:  Patient presents for follow-up appointment for depression.  He states that he has been feeling great since up titration of Wellbutrin.  He has more energy and has been doing exercise and eats healthier diet.  He reports good relationship with his fiance and works on exercise and diet together.  He visited his family in [place].  He bought a truck for his father.  He also talks about his brother, who raised 3 girls by himself. Although he used to feel down that he stays depressed while others are doing more than him, he now thinks that he is doing what he can. He asks about ADHD treatment. He feels that medication is not working longer as it used to (less attention). He takes Mydasis later in the morning so that he can function well. He denies insomnia. He feels less anxious and tense. He has fair concentration. He has good appetite. He denies SI. He denies panic attacks. He has good concentration.   Per PMP,  On clonazepam, Mydasis, last filled on 07/05/2017    Wt Readings from Last 3 Encounters:  08/14/17 250 lb (113.4 kg)  05/15/17 265 lb (120.2 kg)  04/08/17 279 lb (126.6 kg)    Visit Diagnosis:    ICD-10-CM   1. MDD (major depressive disorder), recurrent episode, moderate (HCC) F33.1   2. Attention deficit hyperactivity disorder (ADHD), unspecified ADHD type F90.9     Past Psychiatric History:  I have reviewed the patient's psychiatry history in detail and updated the patient record. Outpatient:depression, anxiety, at age 15 after college, diagnosed with ADHD age 68 (reports he had a test three times, last in his 20's), Psychiatry admission:  Previous suicide attempt:denies Past trials of medication:sertraline, Effexor (had "zaps"), Wellbutrin, Adderall, Adderall XR, Vyvanse, Strattera, History of  violence:denies   Past Medical History:  Past Medical History:  Diagnosis Date  . Hypertension    No past surgical history on file.  Family Psychiatric History: I have reviewed the patient's family history in detail and updated the patient record.  Family History: No family history on file.  Social History:  Social History   Socioeconomic History  . Marital status: Single    Spouse name: Not on file  . Number of children: Not on file  . Years of education: Not on file  . Highest education level: Not on file  Occupational History  . Not on file  Social Needs  . Financial resource strain: Not on file  . Food insecurity:    Worry: Not on file    Inability: Not on file  . Transportation needs:    Medical: Not on file    Non-medical: Not on file  Tobacco Use  . Smoking status: Never Smoker  . Smokeless tobacco: Never Used  Substance and Sexual Activity  . Alcohol use: Yes    Comment: Socail   . Drug use: No  . Sexual activity: Not on file  Lifestyle  . Physical activity:    Days per week: Not on file    Minutes per session: Not on file  . Stress: Not on file  Relationships  . Social connections:    Talks on phone: Not on file    Gets together: Not on file    Attends religious service: Not on file    Active member  of club or organization: Not on file    Attends meetings of clubs or organizations: Not on file    Relationship status: Not on file  Other Topics Concern  . Not on file  Social History Narrative  . Not on file    Allergies: No Known Allergies  Metabolic Disorder Labs: No results found for: HGBA1C, MPG No results found for: PROLACTIN No results found for: CHOL, TRIG, HDL, CHOLHDL, VLDL, LDLCALC No results found for: TSH  Therapeutic Level Labs: No results found for: LITHIUM No results found for: VALPROATE No components found for:  CBMZ  Current Medications: Current Outpatient Medications  Medication Sig Dispense Refill  .  Amphet-Dextroamphet 3-Bead ER (MYDAYIS) 37.5 MG CP24 Take 37.5 mg by mouth every morning.    [name] buPROPion (WELLBUTRIN XL) 150 MG 24 hr tablet Take total of 450 mg daily (300 mg + 150 mg ) 90 tablet 0  . buPROPion (WELLBUTRIN XL) 300 MG 24 hr tablet Take total of 450 mg daily (300 mg + 150 mg ) 90 tablet 0  . clonazePAM (KLONOPIN) 2 MG tablet Take 2 mg by mouth at bedtime.    [name] ibuprofen (ADVIL,MOTRIN) 200 MG tablet Take 200 mg by mouth 3 (three) times daily as needed.    [name] levothyroxine (SYNTHROID, LEVOTHROID) 75 MCG tablet Take 75 mcg by mouth daily before breakfast.    . nebivolol (BYSTOLIC) 10 MG tablet Take 10 mg by mouth daily.    . sertraline (ZOLOFT) 100 MG tablet Take 1.5 tablets (150 mg total) by mouth at bedtime. 135 tablet 0   No current facility-administered medications for this visit.      Musculoskeletal: Strength & Muscle Tone: within normal limits Gait & Station: normal Patient leans: N/A  Psychiatric Specialty Exam: Review of Systems  Psychiatric/Behavioral: Negative for depression, hallucinations, memory loss, substance abuse and suicidal ideas. The patient is nervous/anxious. The patient does not have insomnia.   All other systems reviewed and are negative.   Blood pressure 126/74, pulse 86, height 5\' 11"  (1.803 m), weight 250 lb (113.4 kg), SpO2 94 %.Body mass index is 34.87 kg/m.  General Appearance: Fairly Groomed  Eye Contact:  Good  Speech:  Clear and Coherent  Volume:  Normal  Mood:  "better"  Affect:  Appropriate, Congruent and slightly restricted, but improving  Thought Process:  Coherent and Goal Directed  Orientation:  Full (Time, Place, and Person)  Thought Content: Logical   Suicidal Thoughts:  No  Homicidal Thoughts:  No  Memory:  Immediate;   Good  Judgement:  Good  Insight:  Fair  Psychomotor Activity:  Normal  Concentration:  Concentration: Good and Attention Span: Good  Recall:  Good  Fund of Knowledge: Good  Language: Good  Akathisia:   No  Handed:  Right  AIMS (if indicated): not done  Assets:  Communication Skills Desire for Improvement  ADL's:  Intact  Cognition: WNL  Sleep:  Good   Screenings:   Assessment and Plan:  Jon Beck is a 37 y.o. year old male with a history of depression, ADHD, hypertension, hyperlipidemia,  hypothyroidism, obesity  , who presents for follow up appointment for MDD (major depressive disorder), recurrent episode, moderate (HCC)  Attention deficit hyperactivity disorder (ADHD), unspecified ADHD type  # MDD, moderate, recurrent with anxious distress There has been significant improvement in neurovegetative symptoms after up titration of Wellbutrin.  Will continue Wellbutrin and sertraline to target depression.  Patient has no known history of seizure or headache.  He is on clonazepam, prescribed by PCP.  Discussed risk of dependence and oversedation. May consider tapering it off at the next encounter as we discussed in the past.   # ADHD Patient has had three psychological testing per patient, first at age 37. On Mydayis, prescribed by PCP. He reports some wean off effect at the end of the day; discussed a few options of adding short acting Adderall,  Uptitrate Mydaysis while monitoring side effect, switching to concerta. However, it is mostly recommended to stay on the same regimen at this time given patient has been managing well by taking medication later in the day. He agrees with plans.   Plan I have reviewed and updated plans as below 1. Continue sertraline 150 mg daily  2. Continue Wellbutrin 450 mg daily (150 mg + 300 mg ) 3. Return to clinic in three months for 30 mins - on  clonazepam 2 mg at night(prescribed by PCP) - on Mydayis ER 37.5 mgdaily (prescribed by PCP)  The patient demonstrates the following risk factors for suicide: Chronic risk factors for suicide include:psychiatric disorder ofdepression. Acute risk factorsfor suicide include: N/A. Protective  factorsfor this patient include: positive social support, coping skills and hope for the future. Considering these factors, the overall suicide risk at this point appears to below. Patientisappropriate for outpatient follow up.  The duration of this appointment visit was 30 minutes of face-to-face time with the patient.  Greater than 50% of this time was spent in counseling, explanation of  diagnosis, planning of further management, and coordination of care.  Neysa Hottereina Ashante Yellin, MD 08/14/2017, 10:05 AM

## 2017-08-14 ENCOUNTER — Ambulatory Visit (HOSPITAL_COMMUNITY): Payer: 59 | Admitting: Psychiatry

## 2017-08-14 ENCOUNTER — Encounter (HOSPITAL_COMMUNITY): Payer: Self-pay | Admitting: Psychiatry

## 2017-08-14 VITALS — BP 126/74 | HR 86 | Ht 71.0 in | Wt 250.0 lb

## 2017-08-14 DIAGNOSIS — F419 Anxiety disorder, unspecified: Secondary | ICD-10-CM | POA: Diagnosis not present

## 2017-08-14 DIAGNOSIS — F331 Major depressive disorder, recurrent, moderate: Secondary | ICD-10-CM

## 2017-08-14 DIAGNOSIS — F909 Attention-deficit hyperactivity disorder, unspecified type: Secondary | ICD-10-CM | POA: Diagnosis not present

## 2017-08-14 DIAGNOSIS — R450 Nervousness: Secondary | ICD-10-CM | POA: Diagnosis not present

## 2017-08-14 MED ORDER — SERTRALINE HCL 100 MG PO TABS: 150.0000 mg | ORAL_TABLET | Freq: Every day | ORAL | 0 refills | Status: DC

## 2017-08-14 MED ORDER — BUPROPION HCL ER (XL) 300 MG PO TB24: ORAL_TABLET | ORAL | 0 refills | Status: DC

## 2017-08-14 MED ORDER — BUPROPION HCL ER (XL) 150 MG PO TB24: ORAL_TABLET | ORAL | 0 refills | Status: DC

## 2017-08-14 NOTE — Patient Instructions (Addendum)
1. Continue sertraline 150 mg daily  2. Continue Wellbutrin 450 mg daily (150 mg + 300 mg ) 3. Return to clinic in three months for 30 mins

## 2017-08-27 ENCOUNTER — Telehealth (HOSPITAL_COMMUNITY): Payer: Self-pay | Admitting: *Deleted

## 2017-08-27 ENCOUNTER — Other Ambulatory Visit (HOSPITAL_COMMUNITY): Payer: Self-pay | Admitting: Psychiatry

## 2017-08-27 MED ORDER — AMPHET-DEXTROAMPHET 3-BEAD ER 50 MG PO CP24: 50.0000 mg | ORAL_CAPSULE | Freq: Every day | ORAL | 0 refills | Status: DC

## 2017-08-27 NOTE — Telephone Encounter (Signed)
Discussed with the patient. He has been struggling with worsening concentration for the past few weeks. 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 He has not taken any OTC med other than fish oil. He believes his depression, anxiety is in good control. Will try uptitration of Mydayis. Discussed risk of hypertension, chest pain, palpitation given he is also on wellbutrin. He is advised to contact the office if experiences any side effect.  - increase Mydayis 50 mg daily

## 2017-08-27 NOTE — Telephone Encounter (Signed)
Dr Vanetta Shawl Patient called LVM. I returned call & he would like to speak with you about the medication ? Mydias? He said you & he spoke about @ last visit. Requesting call back from the doctor # [phone]

## 2017-09-03 ENCOUNTER — Other Ambulatory Visit (HOSPITAL_COMMUNITY): Payer: Self-pay | Admitting: Psychiatry

## 2017-09-03 MED ORDER — CLONAZEPAM 2 MG PO TABS: 2.0000 mg | ORAL_TABLET | Freq: Every day | ORAL | 1 refills | Status: DC

## 2017-10-03 ENCOUNTER — Other Ambulatory Visit (HOSPITAL_COMMUNITY): Payer: Self-pay | Admitting: Psychiatry

## 2017-10-03 ENCOUNTER — Telehealth (HOSPITAL_COMMUNITY): Payer: Self-pay | Admitting: *Deleted

## 2017-10-03 MED ORDER — AMPHET-DEXTROAMPHET 3-BEAD ER 50 MG PO CP24: 50.0000 mg | ORAL_CAPSULE | Freq: Every day | ORAL | 0 refills | Status: DC

## 2017-10-03 NOTE — Telephone Encounter (Signed)
Mydiaze 50 mg ER & Clonazepam. RX is correct in sysytem

## 2017-10-03 NOTE — Telephone Encounter (Signed)
Contacted the pharmacy to fill medication today.

## 2017-10-03 NOTE — Telephone Encounter (Signed)
Ask which medication he needs

## 2017-10-03 NOTE — Telephone Encounter (Signed)
Dr Vanetta ShawlHIsada Patient called requesting refills on med's. Last office visit was 08-14-17 & Next office visit is 11/13/17

## 2017-11-07 ENCOUNTER — Other Ambulatory Visit (HOSPITAL_COMMUNITY): Payer: Self-pay | Admitting: Psychiatry

## 2017-11-07 ENCOUNTER — Telehealth (HOSPITAL_COMMUNITY): Payer: Self-pay

## 2017-11-07 MED ORDER — CLONAZEPAM 2 MG PO TABS: 2.0000 mg | ORAL_TABLET | Freq: Every day | ORAL | 0 refills | Status: DC

## 2017-11-07 NOTE — Telephone Encounter (Signed)
Patient called requesting a refill on Klonopin 2mg . Next appointment is scheduled for 11-13-17. Please advise

## 2017-11-07 NOTE — Telephone Encounter (Signed)
Called patient and informed him that prescription has been sent to the pharmacy

## 2017-11-07 NOTE — Telephone Encounter (Signed)
ordered

## 2017-11-08 NOTE — Progress Notes (Addendum)
BH MD/PA/NP OP Progress Note  11/13/2017 10:01 AM Jon Beck  MRN:  [id number]  Chief Complaint:  Chief Complaint    Follow-up; Depression; ADHD     HPI:  Patient presents for follow up appointment for depression and ADHD. He states that he does not see any difference after taking Mydaysis. He states that he made a mistake at work, 2 days after increasing the dose. He made another mistake a few days after. He has never done this type of mistakes and he feels very concerned about it. He is unable to sustain attention. Although he does try to organize, he does not follow through or his mind will go anywhere else. He has been behind the work and is concerned that he may lost his job. He notices that only the thing worked for him is Energy drink. He limits it to once a day as he also notices palpitation. His work schedule varies day to day. He occasionally works at night. He states that he used to be doing better with Vyvanase, although it was switched as he had palpitation with 70 mg. He is willing to try it from lower dose. He denies insomnia. He denies feeling depressed. He denies SI. Although he did feel panic and had anxiety at times, he believes that it has been improved compared to the past. He takes clonazepam at night; he was "sick" when he tried to be off clonazepam, although he hopes to be off from medication in the future. He rarely drinks alcohol. He denies drug use.    Mydayis filled on 10/07/2017 Clonazepam filled on 11/07/2017   Visit Diagnosis:    ICD-10-CM   1. MDD (major depressive disorder), recurrent episode, moderate (HCC) F33.1 Ambulatory referral to Psychology  2. Attention deficit hyperactivity disorder (ADHD), unspecified ADHD type F90.9 Ambulatory referral to Psychology    Past Psychiatric History: Please see initial evaluation for full details. I have reviewed the history. No updates at this time.     Past Medical History:  Past Medical History:  Diagnosis  Date  . Hypertension    No past surgical history on file.  Family Psychiatric History: Please see initial evaluation for full details. I have reviewed the history. No updates at this time.     Family History: No family history on file.  Social History:  Social History   Socioeconomic History  . Marital status: Single    Spouse name: Not on file  . Number of children: Not on file  . Years of education: Not on file  . Highest education level: Not on file  Occupational History  . Not on file  Social Needs  . Financial resource strain: Not on file  . Food insecurity:    Worry: Not on file    Inability: Not on file  . Transportation needs:    Medical: Not on file    Non-medical: Not on file  Tobacco Use  . Smoking status: Never Smoker  . Smokeless tobacco: Never Used  Substance and Sexual Activity  . Alcohol use: Yes    Comment: Socail   . Drug use: No  . Sexual activity: Not on file  Lifestyle  . Physical activity:    Days per week: Not on file    Minutes per session: Not on file  . Stress: Not on file  Relationships  . Social connections:    Talks on phone: Not on file    Gets together: Not on file    Attends religious service:  Not on file    Active member of club or organization: Not on file    Attends meetings of clubs or organizations: Not on file    Relationship status: Not on file  Other Topics Concern  . Not on file  Social History Narrative  . Not on file    Allergies: No Known Allergies  Metabolic Disorder Labs: No results found for: HGBA1C, MPG No results found for: PROLACTIN No results found for: CHOL, TRIG, HDL, CHOLHDL, VLDL, LDLCALC No results found for: TSH  Therapeutic Level Labs: No results found for: LITHIUM No results found for: VALPROATE No components found for:  CBMZ  Current Medications: Current Outpatient Medications  Medication Sig Dispense Refill  . buPROPion (WELLBUTRIN XL) 150 MG 24 hr tablet Take total of 450 mg daily  (300 mg + 150 mg ) 90 tablet 0  . buPROPion (WELLBUTRIN XL) 300 MG 24 hr tablet Take total of 450 mg daily (300 mg + 150 mg ) 90 tablet 0  . [START ON 12/08/2017] clonazePAM (KLONOPIN) 2 MG tablet Take 1 tablet (2 mg total) by mouth at bedtime. 30 tablet 0  . ibuprofen (ADVIL,MOTRIN) 200 MG tablet Take 200 mg by mouth 3 (three) times daily as needed.    [name] levothyroxine (SYNTHROID, LEVOTHROID) 75 MCG tablet Take 75 mcg by mouth daily before breakfast.    . sertraline (ZOLOFT) 100 MG tablet Take 1.5 tablets (150 mg total) by mouth at bedtime. 135 tablet 0  . lisdexamfetamine (VYVANSE) 30 MG capsule Take 1 capsule (30 mg total) by mouth daily. 30 capsule 0  . nebivolol (BYSTOLIC) 10 MG tablet Take 10 mg by mouth daily.     No current facility-administered medications for this visit.      Musculoskeletal: Strength & Muscle Tone: within normal limits Gait & Station: normal Patient leans: N/A  Psychiatric Specialty Exam: Review of Systems  Psychiatric/Behavioral: Negative for depression, hallucinations, memory loss, substance abuse and suicidal ideas. The patient is nervous/anxious. The patient does not have insomnia.   All other systems reviewed and are negative.   Blood pressure 121/73, pulse 80, height 5\' 11"  (1.803 m), weight 242 lb (109.8 kg), SpO2 96 %.Body mass index is 33.75 kg/m.  General Appearance: Fairly Groomed  Eye Contact:  Good  Speech:  Clear and Coherent  Volume:  Normal  Mood:  "good"  Affect:  Appropriate, Congruent and sligtly restricted, down  Thought Process:  Coherent  Orientation:  Full (Time, Place, and Person)  Thought Content: Logical   Suicidal Thoughts:  No  Homicidal Thoughts:  No  Memory:  Immediate;   Good  Judgement:  Good  Insight:  Good  Psychomotor Activity:  Normal  Concentration:  Concentration: Good and Attention Span: Good  Recall:  Good  Fund of Knowledge: Good  Language: Good  Akathisia:  No  Handed:  Right  AIMS (if indicated): not  done  Assets:  Communication Skills Desire for Improvement  ADL's:  Intact  Cognition: WNL  Sleep:  Good   Screenings:   Assessment and Plan:  Jon Beck is a 37 y.o. year old male with a history of depression, ADHD, hypertension, hyperlipidemia,  hypothyroidism, obesity, who presents for follow up appointment for MDD (major depressive disorder), recurrent episode, moderate (HCC) - Plan: Ambulatory referral to Psychology  Attention deficit hyperactivity disorder (ADHD), unspecified ADHD type - Plan: Ambulatory referral to Psychology  # MDD, moderate, recurrent with anxious distress Patient denies significant mood symptoms since up titration of Wellbutrin. Will  continue sertraline and Wellbutrin to target depression. patient has no known history of seizures or headache. Will continue clonazepam when necessary for anxiety. Discussed risk of dependence and oversedation. He is wanting to taper off in the future.   # ADHD Patient had 3 psychological testing by report, first at age 89. He has had no difference in inattention despite uptitration of Mydaysis. Will switch to Vyvanse as it worked well in the past per patient. Discussed risks including but not limited to worsening anxiety, appetite loss, palpitation. Will make referral to CBT to target ADHD.  Plan 1. Discontinue Mydayis 2. Start Vyvanse 30 mg daily  3. Continue sertraline 150 mg daily  4. Continue Wellbutrin 450 mg daily (150 mg + 300 mg ) 5. Return to clinic in one month for 15 mins - on  clonazepam 2 mg at night(prescribed by PCP) - on Mydayis ER 37.5 mgdaily (prescribed by PCP)  Past trials of medication:sertraline, Effexor (had "zaps"), Wellbutrin, Adderall, Adderall XR, Vyvanse, Strattera,  The patient demonstrates the following risk factors for suicide: Chronic risk factors for suicide include:psychiatric disorder ofdepression. Acute risk factorsfor suicide include: N/A. Protective factorsfor this patient  include: positive social support, coping skills and hope for the future. Considering these factors, the overall suicide risk at this point appears to below. Patientisappropriate for outpatient follow up.  The duration of this appointment visit was 30 minutes of face-to-face time with the patient.  Greater than 50% of this time was spent in counseling, explanation of  diagnosis, planning of further management, and coordination of care.  [name], MD 11/13/2017, 10:01 AM

## 2017-11-10 ENCOUNTER — Other Ambulatory Visit (HOSPITAL_COMMUNITY): Payer: Self-pay | Admitting: Psychiatry

## 2017-11-10 MED ORDER — BUPROPION HCL ER (XL) 150 MG PO TB24: ORAL_TABLET | ORAL | 0 refills | Status: DC

## 2017-11-10 MED ORDER — BUPROPION HCL ER (XL) 300 MG PO TB24: ORAL_TABLET | ORAL | 0 refills | Status: DC

## 2017-11-13 ENCOUNTER — Encounter (HOSPITAL_COMMUNITY): Payer: Self-pay | Admitting: Psychiatry

## 2017-11-13 ENCOUNTER — Ambulatory Visit (HOSPITAL_COMMUNITY): Payer: 59 | Admitting: Psychiatry

## 2017-11-13 VITALS — BP 121/73 | HR 80 | Ht 71.0 in | Wt 242.0 lb

## 2017-11-13 DIAGNOSIS — F909 Attention-deficit hyperactivity disorder, unspecified type: Secondary | ICD-10-CM

## 2017-11-13 DIAGNOSIS — F331 Major depressive disorder, recurrent, moderate: Secondary | ICD-10-CM

## 2017-11-13 MED ORDER — LISDEXAMFETAMINE DIMESYLATE 30 MG PO CAPS: 30.0000 mg | ORAL_CAPSULE | Freq: Every day | ORAL | 0 refills | Status: DC

## 2017-11-13 MED ORDER — CLONAZEPAM 2 MG PO TABS: 2.0000 mg | ORAL_TABLET | Freq: Every day | ORAL | 0 refills | Status: DC

## 2017-11-13 MED ORDER — SERTRALINE HCL 100 MG PO TABS: 150.0000 mg | ORAL_TABLET | Freq: Every day | ORAL | 0 refills | Status: DC

## 2017-11-13 NOTE — Patient Instructions (Signed)
1. Discontinue Mydayis 2. Start Vyvanse 30 mg daily  3. Continue sertraline 150 mg daily  4. Continue Wellbutrin 450 mg daily (150 mg + 300 mg ) 5. Return to clinic in one month for 15 mins

## 2017-11-25 ENCOUNTER — Telehealth (HOSPITAL_COMMUNITY): Payer: Self-pay | Admitting: Psychiatry

## 2017-11-25 NOTE — Telephone Encounter (Signed)
Received lab. TSH 5.16, fT4 0.85. Other results in the scan.

## 2017-12-12 ENCOUNTER — Telehealth (HOSPITAL_COMMUNITY): Payer: Self-pay | Admitting: *Deleted

## 2017-12-12 NOTE — Telephone Encounter (Signed)
It has been sent at the last visit. Please verify with pharmacy.

## 2017-12-12 NOTE — Telephone Encounter (Signed)
Dr Vanetta ShawlHisada Patient called requesting refill on Clonazepam. Next office visit 12/16/17

## 2017-12-12 NOTE — Progress Notes (Signed)
BH MD/PA/NP OP Progress Note  12/16/2017 3:38 PM Jon Beck  MRN:  [id number]  Chief Complaint:  Chief Complaint    Depression; ADHD; Follow-up     HPI:  Patient presents for follow-up appointment for depression and ADHD.  He states that he still struggles with concentration.  He cut down his work load to allow himself more time.  He has not noticed any difference after starting Vyvanse.  He took 60 mg on 2 separate days, and he finds himself very productive. He could do training course, which he had not been able to do for many years due to his distractibility.  He understands the risk of self adjusting medication and agreed to keep adherence.  He also is willing to try the least effective dose.  He reports he had a history of significant palpitation when he tried 70 mg in the past.  He denies insomnia.  He denies feeling depressed.  He has good relationship with his fiance.  He denies SI.  He denies anxiety.  He has good appetite and is pleased to lose his weight.   Wt Readings from Last 3 Encounters:  12/16/17 247 lb (112 kg)  11/13/17 242 lb (109.8 kg)  08/14/17 250 lb (113.4 kg)   Per PMP,  Clonazepam filled on 12/12/2017  vyvanse on 11/13/2017    Visit Diagnosis:    ICD-10-CM   1. MDD (major depressive disorder), recurrent episode, moderate (HCC) F33.1   2. Attention deficit hyperactivity disorder (ADHD), unspecified ADHD type F90.9     Past Psychiatric History: Please see initial evaluation for full details. I have reviewed the history. No updates at this time.     Past Medical History:  Past Medical History:  Diagnosis Date  . Hypertension    No past surgical history on file.  Family Psychiatric History: Please see initial evaluation for full details. I have reviewed the history. No updates at this time.     Family History: No family history on file.  Social History:  Social History   Socioeconomic History  . Marital status: Single    Spouse name: Not on  file  . Number of children: Not on file  . Years of education: Not on file  . Highest education level: Not on file  Occupational History  . Not on file  Social Needs  . Financial resource strain: Not on file  . Food insecurity:    Worry: Not on file    Inability: Not on file  . Transportation needs:    Medical: Not on file    Non-medical: Not on file  Tobacco Use  . Smoking status: Never Smoker  . Smokeless tobacco: Never Used  Substance and Sexual Activity  . Alcohol use: Yes    Comment: Socail   . Drug use: No  . Sexual activity: Not on file  Lifestyle  . Physical activity:    Days per week: Not on file    Minutes per session: Not on file  . Stress: Not on file  Relationships  . Social connections:    Talks on phone: Not on file    Gets together: Not on file    Attends religious service: Not on file    Active member of club or organization: Not on file    Attends meetings of clubs or organizations: Not on file    Relationship status: Not on file  Other Topics Concern  . Not on file  Social History Narrative  . Not on file  Allergies: No Known Allergies  Metabolic Disorder Labs: No results found for: HGBA1C, MPG No results found for: PROLACTIN No results found for: CHOL, TRIG, HDL, CHOLHDL, VLDL, LDLCALC No results found for: TSH  Therapeutic Level Labs: No results found for: LITHIUM No results found for: VALPROATE No components found for:  CBMZ  Current Medications: Current Outpatient Medications  Medication Sig Dispense Refill  . buPROPion (WELLBUTRIN XL) 150 MG 24 hr tablet Take total of 450 mg daily (300 mg + 150 mg ) 90 tablet 0  . buPROPion (WELLBUTRIN XL) 300 MG 24 hr tablet Take total of 450 mg daily (300 mg + 150 mg ) 90 tablet 0  . clonazePAM (KLONOPIN) 2 MG tablet Take 1 tablet (2 mg total) by mouth at bedtime. 30 tablet 0  . ibuprofen (ADVIL,MOTRIN) 200 MG tablet Take 200 mg by mouth 3 (three) times daily as needed.    [name]. levothyroxine  (SYNTHROID, LEVOTHROID) 75 MCG tablet Take 75 mcg by mouth daily before breakfast.    . nebivolol (BYSTOLIC) 10 MG tablet Take 10 mg by mouth daily.    . sertraline (ZOLOFT) 100 MG tablet Take 1.5 tablets (150 mg total) by mouth at bedtime. 135 tablet 0  . lisdexamfetamine (VYVANSE) 50 MG capsule Take 1 capsule (50 mg total) by mouth daily before breakfast. 30 capsule 0  . [START ON 01/16/2018] lisdexamfetamine (VYVANSE) 50 MG capsule Take 1 capsule (50 mg total) by mouth daily before breakfast. 30 capsule 0  . [START ON 02/16/2018] lisdexamfetamine (VYVANSE) 50 MG capsule Take 1 capsule (50 mg total) by mouth daily before breakfast. 30 capsule 0   No current facility-administered medications for this visit.      Musculoskeletal: Strength & Muscle Tone: within normal limits Gait & Station: normal Patient leans: N/A  Psychiatric Specialty Exam: Review of Systems  Psychiatric/Behavioral: Negative for depression, hallucinations, memory loss, substance abuse and suicidal ideas. The patient is not nervous/anxious and does not have insomnia.   All other systems reviewed and are negative.   Blood pressure (!) 144/77, pulse 84, height 5\' 11"  (1.803 m), weight 247 lb (112 kg), SpO2 97 %.Body mass index is 34.45 kg/m.  General Appearance: Fairly Groomed  Eye Contact:  Good  Speech:  Clear and Coherent  Volume:  Normal  Mood:  "good"  Affect:  Appropriate, Congruent and slightly tense  Thought Process:  Coherent  Orientation:  Full (Time, Place, and Person)  Thought Content: Logical   Suicidal Thoughts:  No  Homicidal Thoughts:  No  Memory:  Immediate;   Good  Judgement:  Good  Insight:  Fair  Psychomotor Activity:  Normal  Concentration:  Concentration: Good and Attention Span: Good  Recall:  Good  Fund of Knowledge: Good  Language: Good  Akathisia:  No  Handed:  Right  AIMS (if indicated): not done  Assets:  Communication Skills Desire for Improvement  ADL's:  Intact  Cognition:  WNL  Sleep:  Good   Screenings:   Assessment and Plan:  Jon Beck is a 37 y.o. year old male with a history of depression, ADHD, hypertension, hyperlipidemia,hypothyroidism, obesity, who presents for follow up appointment for MDD (major depressive disorder), recurrent episode, moderate (HCC)  Attention deficit hyperactivity disorder (ADHD), unspecified ADHD type  # MDD, moderate, recurrent with anxious distress Patient denies significant mood symptoms since up titration of Wellbutrin.  Will continue sertraline and Wellbutrin to target depression.  Patient has no known history of seizure or headache.  We will continue to  taper off clonazepam; he is advised to contact the office if he needs any refill.  Discussed risk of dependence and oversedation.   # ADHD Patient had 3 psychological testing by report, first at age 1.  He has not seen significant benefit from current dose of Vyvanse (30mg ) and had some benefit from 60 mg. Will uptitrate the dose to target ADHD.  Discussed importance of adherence.  Discussed risk of anxiety, appetite loss, palpitation, and worsening headache. He was referred to CBT for ADHD; [phone]  Plan 1. Increase Vyvanse 50 mg daily  2. Continue sertraline 150 mg daily 3. Continue Wellbutrin 450 mg daily (150 mg + 300 mg ) 4. Decrease clonazepam 1 mg at night for two weeks, then 0.5 mg at night for two weeks, then discontinue 5. Return to clinic in three months for 15 mins - decrease clonazepam 1 mg at night(prescribed by PCP)  Past trials of medication:sertraline, Effexor (had "zaps"), Wellbutrin, Adderall, Adderall XR, Vyvanse, Strattera,  The patient demonstrates the following risk factors for suicide: Chronic risk factors for suicide include:psychiatric disorder ofdepression. Acute risk factorsfor suicide include: N/A. Protective factorsfor this patient include: positive social support, coping skills and hope for the future. Considering these  factors, the overall suicide risk at this point appears to below. Patientisappropriate for outpatient follow up.  [name], MD 12/16/2017, 3:38 PM

## 2017-12-16 ENCOUNTER — Ambulatory Visit (HOSPITAL_COMMUNITY): Payer: 59 | Admitting: Psychiatry

## 2017-12-16 ENCOUNTER — Encounter (HOSPITAL_COMMUNITY): Payer: Self-pay | Admitting: Psychiatry

## 2017-12-16 VITALS — BP 144/77 | HR 84 | Ht 71.0 in | Wt 247.0 lb

## 2017-12-16 DIAGNOSIS — F331 Major depressive disorder, recurrent, moderate: Secondary | ICD-10-CM

## 2017-12-16 DIAGNOSIS — F909 Attention-deficit hyperactivity disorder, unspecified type: Secondary | ICD-10-CM

## 2017-12-16 MED ORDER — BUPROPION HCL ER (XL) 150 MG PO TB24: ORAL_TABLET | ORAL | 0 refills | Status: DC

## 2017-12-16 MED ORDER — SERTRALINE HCL 100 MG PO TABS: 150.0000 mg | ORAL_TABLET | Freq: Every day | ORAL | 0 refills | Status: DC

## 2017-12-16 MED ORDER — LISDEXAMFETAMINE DIMESYLATE 50 MG PO CAPS: 50.0000 mg | ORAL_CAPSULE | Freq: Every day | ORAL | 0 refills | Status: DC

## 2017-12-16 MED ORDER — BUPROPION HCL ER (XL) 300 MG PO TB24: ORAL_TABLET | ORAL | 0 refills | Status: DC

## 2017-12-16 NOTE — Patient Instructions (Signed)
1. Increase Vyvanse 50 mg daily  2. Continue sertraline 150 mg daily 3. Continue Wellbutrin 450 mg daily (150 mg + 300 mg ) 4. Decrease clonazepam 1 mg at night for two weeks, then 0.5 mg at night for two weeks, then discontinue 5. Return to clinic in three months for 15 mins

## 2018-01-15 ENCOUNTER — Telehealth (HOSPITAL_COMMUNITY): Payer: Self-pay | Admitting: *Deleted

## 2018-01-15 NOTE — Telephone Encounter (Signed)
Could you contact the pharmacy- there should be a order from 8/19, which he has not received. Also ask the patient how much clonazepam he is actually taking now; we discussed to taper it off at the last visit.

## 2018-01-15 NOTE — Telephone Encounter (Signed)
Dr Vanetta Shawl Patient called in requesting refill on Clonazepam 2 mg

## 2018-01-15 NOTE — Telephone Encounter (Signed)
Dr Richardo Priest with Pharmacist & the Clonazepam 12/08/17 script was filled on 8/23 #30. Nothing was hold so I gave the Verbal Order  # 30 0-refills

## 2018-01-16 ENCOUNTER — Other Ambulatory Visit (HOSPITAL_COMMUNITY): Payer: Self-pay | Admitting: Psychiatry

## 2018-01-16 MED ORDER — CLONAZEPAM 2 MG PO TABS: 2.0000 mg | ORAL_TABLET | Freq: Every day | ORAL | 0 refills | Status: DC

## 2018-01-16 MED ORDER — CLONAZEPAM 2 MG PO TABS: 2.0000 mg | ORAL_TABLET | Freq: Every day | ORAL | 1 refills | Status: DC

## 2018-02-16 ENCOUNTER — Telehealth (HOSPITAL_COMMUNITY): Payer: Self-pay | Admitting: *Deleted

## 2018-02-16 NOTE — Telephone Encounter (Signed)
Advise him to make sooner appointment for further evaluation.

## 2018-02-16 NOTE — Telephone Encounter (Signed)
Dr Vanetta Shawl Patient called & stated that he is having concerns about his ADHD medication. States that he feels no effect from the Vyvanse. 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 And  ??'s if he can try something else

## 2018-02-16 NOTE — Telephone Encounter (Signed)
Appointment tomorrow : 02/17/18

## 2018-02-17 ENCOUNTER — Ambulatory Visit (HOSPITAL_COMMUNITY): Payer: Self-pay | Admitting: Psychiatry

## 2018-02-17 ENCOUNTER — Encounter (HOSPITAL_COMMUNITY): Payer: Self-pay | Admitting: Psychiatry

## 2018-02-17 VITALS — BP 144/90 | HR 90 | Ht 71.0 in | Wt 260.0 lb

## 2018-02-17 DIAGNOSIS — F909 Attention-deficit hyperactivity disorder, unspecified type: Secondary | ICD-10-CM

## 2018-02-17 DIAGNOSIS — I10 Essential (primary) hypertension: Secondary | ICD-10-CM

## 2018-02-17 DIAGNOSIS — F331 Major depressive disorder, recurrent, moderate: Secondary | ICD-10-CM

## 2018-02-17 MED ORDER — LISDEXAMFETAMINE DIMESYLATE 60 MG PO CAPS: 60.0000 mg | ORAL_CAPSULE | ORAL | 0 refills | Status: DC

## 2018-02-17 MED ORDER — CLONAZEPAM 2 MG PO TABS: 2.0000 mg | ORAL_TABLET | Freq: Every day | ORAL | 0 refills | Status: DC

## 2018-02-17 NOTE — Patient Instructions (Addendum)
1. Increase Vyvanse 60 mg daily  2. Continue sertraline 150 mg daily 3. Continue Wellbutrin 450 mg daily (150 mg + 300 mg ) 4. Continue clonazepam 2 mg at night as needed for anxiety  5. Return to clinic in one month for 30 mins

## 2018-02-17 NOTE — Progress Notes (Signed)
BH MD/PA/NP OP Progress Note  02/17/2018 4:21 PM Jon Beck  MRN:  [id number]  Chief Complaint:  Chief Complaint    Depression; Follow-up     HPI:  Patient presents for follow-up appointment for depression and ADHD.  This appointment was made urgently due to difficulty in concentration.  He states that he has been behind on everything.  Although people celebrated him for 10-year anniversary at work, he has been very stressed as he is unable to concentrate well. 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 He would wake up and watch movies without working on his tasks. He is not paying attention to his diet (he used to be on keto diet) anymore. He is not doing cardio anymore, stating that he is unable to make priority. He believes that it has gradually worsened over two months, although he initially notices good effect after uptitration of vyvanse for once a week or less. He may have worsening in attention in early morning and late in the afternoon. He does not think he is depressed, stating that he has good relationship with his fiance. He is unsure why he is feeling this way. He denies insomnia. He has increased appetite. He denies SI. He denies anxiety. He had significant anxiety when he tried to taper off clonazepam; it was reinitiated. He takes clonazepam 2 mg daily.   Wt Readings from Last 3 Encounters:  02/17/18 260 lb (117.9 kg)  12/16/17 247 lb (112 kg)  11/13/17 242 lb (109.8 kg)    Per PMP,  vyvanse filled on 01/17/2018  Clonazepam filled on 01/15/2018    Visit Diagnosis:    ICD-10-CM   1. Attention deficit hyperactivity disorder (ADHD), unspecified ADHD type F90.9   2. MDD (major depressive disorder), recurrent episode, moderate (HCC) F33.1     Past Psychiatric History: Please see initial evaluation for full details. I have reviewed the history. No updates at this time.     Past Medical History:  Past Medical History:  Diagnosis Date  . Hypertension    No past surgical  history on file.  Family Psychiatric History: Please see initial evaluation for full details. I have reviewed the history. No updates at this time.     Family History: No family history on file.  Social History:  Social History   Socioeconomic History  . Marital status: Single    Spouse name: Not on file  . Number of children: Not on file  . Years of education: Not on file  . Highest education level: Not on file  Occupational History  . Not on file  Social Needs  . Financial resource strain: Not on file  . Food insecurity:    Worry: Not on file    Inability: Not on file  . Transportation needs:    Medical: Not on file    Non-medical: Not on file  Tobacco Use  . Smoking status: Never Smoker  . Smokeless tobacco: Never Used  Substance and Sexual Activity  . Alcohol use: Yes    Comment: Socail   . Drug use: No  . Sexual activity: Not on file  Lifestyle  . Physical activity:    Days per week: Not on file    Minutes per session: Not on file  . Stress: Not on file  Relationships  . Social connections:    Talks on phone: Not on file    Gets together: Not on file    Attends religious service: Not on file    Active member  of club or organization: Not on file    Attends meetings of clubs or organizations: Not on file    Relationship status: Not on file  Other Topics Concern  . Not on file  Social History Narrative  . Not on file    Allergies: No Known Allergies  Metabolic Disorder Labs: No results found for: HGBA1C, MPG No results found for: PROLACTIN No results found for: CHOL, TRIG, HDL, CHOLHDL, VLDL, LDLCALC No results found for: TSH  Therapeutic Level Labs: No results found for: LITHIUM No results found for: VALPROATE No components found for:  CBMZ  Current Medications: Current Outpatient Medications  Medication Sig Dispense Refill  . buPROPion (WELLBUTRIN XL) 150 MG 24 hr tablet Take total of 450 mg daily (300 mg + 150 mg ) 90 tablet 0  . buPROPion  (WELLBUTRIN XL) 300 MG 24 hr tablet Take total of 450 mg daily (300 mg + 150 mg ) 90 tablet 0  . clonazePAM (KLONOPIN) 2 MG tablet Take 1 tablet (2 mg total) by mouth at bedtime. 30 tablet 0  . ibuprofen (ADVIL,MOTRIN) 200 MG tablet Take 200 mg by mouth 3 (three) times daily as needed.    [name] levothyroxine (SYNTHROID, LEVOTHROID) 75 MCG tablet Take 75 mcg by mouth daily before breakfast.    . nebivolol (BYSTOLIC) 10 MG tablet Take 10 mg by mouth daily.    . sertraline (ZOLOFT) 100 MG tablet Take 1.5 tablets (150 mg total) by mouth at bedtime. 135 tablet 0  . lisdexamfetamine (VYVANSE) 50 MG capsule Take 1 capsule (50 mg total) by mouth daily before breakfast. 30 capsule 0  . lisdexamfetamine (VYVANSE) 60 MG capsule Take 1 capsule (60 mg total) by mouth every morning. 30 capsule 0   No current facility-administered medications for this visit.      Musculoskeletal: Strength & Muscle Tone: within normal limits Gait & Station: normal Patient leans: N/A  Psychiatric Specialty Exam: Review of Systems  Psychiatric/Behavioral: Negative for depression, hallucinations, memory loss, substance abuse and suicidal ideas. The patient is not nervous/anxious and does not have insomnia.   All other systems reviewed and are negative.   Blood pressure (!) 144/90, pulse 90, height 5\' 11"  (1.803 m), weight 260 lb (117.9 kg), SpO2 97 %.Body mass index is 36.26 kg/m.  General Appearance: Fairly Groomed  Eye Contact:  Good  Speech:  Clear and Coherent  Volume:  Normal  Mood:  "fine"  Affect:  Appropriate, Congruent, Restricted and down  Thought Process:  Coherent  Orientation:  Full (Time, Place, and Person)  Thought Content: Logical   Suicidal Thoughts:  No  Homicidal Thoughts:  No  Memory:  Immediate;   Good  Judgement:  Good  Insight:  Fair  Psychomotor Activity:  Normal  Concentration:  Concentration: Good and Attention Span: Good  Recall:  Good  Fund of Knowledge: Good  Language: Good   Akathisia:  No  Handed:  Right  AIMS (if indicated): not done  Assets:  Communication Skills Desire for Improvement  ADL's:  Intact  Cognition: WNL  Sleep:  Fair   Screenings:   Assessment and Plan:  Jon Beck is a 37 y.o. year old male with a history of depression, ADHD, hypertension, hyperlipidemia,hypothyroidism, obesity, , who presents for follow up appointment for Attention deficit hyperactivity disorder (ADHD), unspecified ADHD type  MDD (major depressive disorder), recurrent episode, moderate (HCC)  # ADHD Patient had three psychological testing in the past by report, first at age 40.  He reports worsening  inattention over the past few months. Discussed in length regarding the rationale of the treatment (although he reports preference to switch to other medication, it is reasonable to first try effective dose with the same medication given vyvanse has been effective until recently). Will up titrate Vyvanse to target ADHD.  Discussed potential side effect of dependence, tachycardia, hypertension and worsening anxiety.   # MDD, moderate, recurrent with anxious distress Exam is notable for his tense affect, although he denies feeling depressed. Will continue sertraline and Wellbutrin to target depression.  Patient has no known history of seizure or headache.  Will continue clonazepam as needed for anxiety (he is unable to taper off this medication).  Discussed risk of dependence and oversedation.   # Hypertension Patient reports lower blood pressure at home. Will continue to monitor.  Plan I have reviewed and updated plans as below 1. Increase Vyvanse 60 mg daily  2. Continue sertraline 150 mg daily 3. Continue Wellbutrin 450 mg daily (150 mg + 300 mg ) 4  Continue clonazepam 2 mg at night as needed for anxiety  5. Return to clinic in three months for 30 mins - decrease clonazepam 1 mg at night(prescribed by PCP) (He was referred to CBT for ADHD; [phone])- will  ask him if he followed this up  Past trials of medication:sertraline, Effexor (had "zaps"), Wellbutrin, Adderall, Adderall XR, Vyvanse, Concerta, Strattera,  The patient demonstrates the following risk factors for suicide: Chronic risk factors for suicide include:psychiatric disorder ofdepression. Acute risk factorsfor suicide include: N/A. Protective factorsfor this patient include: positive social support, coping skills and hope for the future. Considering these factors, the overall suicide risk at this point appears to below. Patientisappropriate for outpatient follow up.  The duration of this appointment visit was 30 minutes of face-to-face time with the patient.  Greater than 50% of this time was spent in counseling, explanation of  diagnosis, planning of further management, and coordination of care.  [name], MD 02/17/2018, 4:21 PM

## 2018-03-17 NOTE — Progress Notes (Deleted)
BH MD/PA/NP OP Progress Note  03/17/2018 12:24 PM Jon Beck  MRN:  [id number]  Chief Complaint:  HPI:   Psychology referral  adzenys xr 12.5 or 15.7  strattera 40 mg per day Jon Beck Jon Beck PM: Initial: 20 mg once daily in the evening between 6:30 PM and 9:30 PM (eg, 8:00 PM); may increase dose in increments of 20 mg/day at weekly intervals; maximum daily dose: 100 mg/day.   Visit Diagnosis: No diagnosis found.  Past Psychiatric History: Please see initial evaluation for full details. I have reviewed the history. No updates at this time.     Past Medical History:  Past Medical History:  Diagnosis Date  . Hypertension    No past surgical history on file.  Family Psychiatric History: Please see initial evaluation for full details. I have reviewed the history. No updates at this time.     Family History: No family history on file.  Social History:  Social History   Socioeconomic History  . Marital status: Single    Spouse name: Not on file  . Number of children: Not on file  . Years of education: Not on file  . Highest education level: Not on file  Occupational History  . Not on file  Social Needs  . Financial resource strain: Not on file  . Food insecurity:    Worry: Not on file    Inability: Not on file  . Transportation needs:    Medical: Not on file    Non-medical: Not on file  Tobacco Use  . Smoking status: Never Smoker  . Smokeless tobacco: Never Used  Substance and Sexual Activity  . Alcohol use: Yes    Comment: Socail   . Drug use: No  . Sexual activity: Not on file  Lifestyle  . Physical activity:    Days per week: Not on file    Minutes per session: Not on file  . Stress: Not on file  Relationships  . Social connections:    Talks on phone: Not on file    Gets together: Not on file    Attends religious service: Not on file    Active member of club or organization: Not on file    Attends meetings of clubs or organizations: Not on file     Relationship status: Not on file  Other Topics Concern  . Not on file  Social History Narrative  . Not on file    Allergies: No Known Allergies  Metabolic Disorder Labs: No results found for: HGBA1C, MPG No results found for: PROLACTIN No results found for: CHOL, TRIG, HDL, CHOLHDL, VLDL, LDLCALC No results found for: TSH  Therapeutic Level Labs: No results found for: LITHIUM No results found for: VALPROATE No components found for:  CBMZ  Current Medications: Current Outpatient Medications  Medication Sig Dispense Refill  . buPROPion (WELLBUTRIN XL) 150 MG 24 hr tablet Take total of 450 mg daily (300 mg + 150 mg ) 90 tablet 0  . buPROPion (WELLBUTRIN XL) 300 MG 24 hr tablet Take total of 450 mg daily (300 mg + 150 mg ) 90 tablet 0  . clonazePAM (KLONOPIN) 2 MG tablet Take 1 tablet (2 mg total) by mouth at bedtime. 30 tablet 0  . ibuprofen (ADVIL,MOTRIN) 200 MG tablet Take 200 mg by mouth 3 (three) times daily as needed.    [name]. levothyroxine (SYNTHROID, LEVOTHROID) 75 MCG tablet Take 75 mcg by mouth daily before breakfast.    . lisdexamfetamine (VYVANSE) 50 MG capsule Take  1 capsule (50 mg total) by mouth daily before breakfast. 30 capsule 0  . lisdexamfetamine (VYVANSE) 60 MG capsule Take 1 capsule (60 mg total) by mouth every morning. 30 capsule 0  . nebivolol (BYSTOLIC) 10 MG tablet Take 10 mg by mouth daily.    . sertraline (ZOLOFT) 100 MG tablet Take 1.5 tablets (150 mg total) by mouth at bedtime. 135 tablet 0   No current facility-administered medications for this visit.      Musculoskeletal: Strength & Muscle Tone: within normal limits Gait & Station: normal Patient leans: N/A  Psychiatric Specialty Exam: ROS  There were no vitals taken for this visit.There is no height or weight on file to calculate BMI.  General Appearance: Fairly Groomed  Eye Contact:  Good  Speech:  Clear and Coherent  Volume:  Normal  Mood:  {BHH MOOD:22306}  Affect:  {Affect  (PAA):22687}  Thought Process:  Coherent  Orientation:  Full (Time, Place, and Person)  Thought Content: Logical   Suicidal Thoughts:  {ST/HT (PAA):22692}  Homicidal Thoughts:  {ST/HT (PAA):22692}  Memory:  Immediate;   Good  Judgement:  {Judgement (PAA):22694}  Insight:  {Insight (PAA):22695}  Psychomotor Activity:  Normal  Concentration:  Concentration: Good and Attention Span: Good  Recall:  Good  Fund of Knowledge: Good  Language: Good  Akathisia:  No  Handed:  Right  AIMS (if indicated): not done  Assets:  Communication Skills Desire for Improvement  ADL's:  Intact  Cognition: WNL  Sleep:  {BHH GOOD/FAIR/POOR:22877}   Screenings:   Assessment and Plan:  Jon Beck is a 37 y.o. year old male with a history of depression, ADHD, hypertension,  hyperlipidemia,hypothyroidism, obesity, who presents for follow up appointment for No diagnosis found.  # ADHD  Patient had three psychological testing in the past by report, first at age 33.  He reports worsening inattention over the past few months. Discussed in length regarding the rationale of the treatment (although he reports preference to switch to other medication, it is reasonable to first try effective dose with the same medication given vyvanse has been effective until recently). Will up titrate Vyvanse to target ADHD.  Discussed potential side effect of dependence, tachycardia, hypertension and worsening anxiety.   # MDD, moderate, recurrent with anxious distress Exam is notable for his tense affect, although he denies feeling depressed. Will continue sertraline and Wellbutrin to target depression.  Patient has no known history of seizure or headache.  Will continue clonazepam as needed for anxiety (he is unable to taper off this medication).  Discussed risk of dependence and oversedation.   # Hypertension Patient reports lower blood pressure at home. Will continue to monitor.  Plan I have reviewed and updated plans  as below 1. Increase Vyvanse 60 mg daily  2.Continue sertraline 150 mg daily 3. Continue Wellbutrin 450 mg daily (150 mg + 300 mg ) 4  Continue clonazepam 2 mg at night as needed for anxiety  5. Return to clinic inthree months for 30 mins -decreaseclonazepam1mg  at night(prescribed by PCP) (He was referred to CBT for ADHD; [phone])- will ask him if he followed this up  Past trials of medication:sertraline, Effexor (had "zaps"), Wellbutrin, Adderall, Adderall XR, Vyvanse, Concerta, Strattera,  The patient demonstrates the following risk factors for suicide: Chronic risk factors for suicide include:psychiatric disorder ofdepression. Acute risk factorsfor suicide include: N/A. Protective factorsfor this patient include: positive social support, coping skills and hope for the future. Considering these factors, the overall suicide risk at this point  appears to below. Patientisappropriate for outpatient follow up.  [name], MD 03/17/2018, 12:24 PM

## 2018-03-18 ENCOUNTER — Other Ambulatory Visit (HOSPITAL_COMMUNITY): Payer: Self-pay | Admitting: Psychiatry

## 2018-03-18 MED ORDER — SERTRALINE HCL 100 MG PO TABS: 150.0000 mg | ORAL_TABLET | Freq: Every day | ORAL | 0 refills | Status: DC

## 2018-03-23 ENCOUNTER — Ambulatory Visit (HOSPITAL_COMMUNITY): Payer: Self-pay | Admitting: Psychiatry

## 2018-04-23 ENCOUNTER — Other Ambulatory Visit (HOSPITAL_COMMUNITY): Payer: Self-pay | Admitting: Psychiatry

## 2018-04-23 ENCOUNTER — Telehealth (HOSPITAL_COMMUNITY): Payer: Self-pay | Admitting: *Deleted

## 2018-04-23 MED ORDER — CLONAZEPAM 2 MG PO TABS: 2.0000 mg | ORAL_TABLET | Freq: Every day | ORAL | 0 refills | Status: DC

## 2018-04-23 MED ORDER — LISDEXAMFETAMINE DIMESYLATE 60 MG PO CAPS: 60.0000 mg | ORAL_CAPSULE | ORAL | 0 refills | Status: DC

## 2018-04-23 NOTE — Telephone Encounter (Signed)
Dr Tenny Craw Dr Vanetta Shawl patient called requesting refills on Clonazepam & Vyvanse  60 mg. LVM to inform patient that he missed appointment on 03/23/18  & that he will need to reschedule appointment.

## 2018-04-23 NOTE — Telephone Encounter (Signed)
Sent in 30 days supply  

## 2018-05-01 ENCOUNTER — Telehealth (HOSPITAL_COMMUNITY): Payer: Self-pay

## 2018-05-01 NOTE — Telephone Encounter (Signed)
Prior Authorization complete for Vyvanse 60 mg. Approved

## 2018-05-19 ENCOUNTER — Telehealth (HOSPITAL_COMMUNITY): Payer: Self-pay | Admitting: Psychiatry

## 2018-05-19 MED ORDER — BUPROPION HCL ER (XL) 300 MG PO TB24: ORAL_TABLET | ORAL | 0 refills | Status: DC

## 2018-05-19 MED ORDER — BUPROPION HCL ER (XL) 150 MG PO TB24: ORAL_TABLET | ORAL | 0 refills | Status: DC

## 2018-05-19 NOTE — Telephone Encounter (Signed)
LVM

## 2018-05-19 NOTE — Telephone Encounter (Signed)
Ordered refill for bupropion per request. Please contact the patient to make follow up appointment, and notify of no show policy. Will not plan to prescribe any refill without evaluation. 

## 2018-05-22 ENCOUNTER — Telehealth (HOSPITAL_COMMUNITY): Payer: Self-pay | Admitting: *Deleted

## 2018-05-22 NOTE — Telephone Encounter (Signed)
Dr Vanetta Shawl  LVM per provider: Ordered refill for bupropion per request. Please contact the patient to make follow up appointment, and notify of no show policy. Will not plan to prescribe any refill without evaluation.  **Patient has scheduled appointment for 06/04/2018

## 2018-05-25 ENCOUNTER — Other Ambulatory Visit (HOSPITAL_COMMUNITY): Payer: Self-pay | Admitting: Psychiatry

## 2018-05-25 MED ORDER — CLONAZEPAM 2 MG PO TABS: 2.0000 mg | ORAL_TABLET | Freq: Every day | ORAL | 0 refills | Status: DC

## 2018-06-01 NOTE — Progress Notes (Signed)
BH MD/PA/NP OP Progress Note  06/04/2018 11:22 AM Jon Beck  MRN:  [id number]  Chief Complaint:  Chief Complaint    Follow-up; ADHD     HPI:  Patient presents for follow-up appointment for ADHD and depression.  He states that he was unsure if he were to come back today.  He felt that he could not express well about his difficulty at the last visit.  He has had worsening in attention, organization.  He has more difficulty at work lately.  He had negative feedback at work, which occurred for the first time while he has been working for more than 10 years.  He has been procrastinating anything.  Although he wakes up around 5 AM, he watches YouTube instead of doing work.  He is unable to write mind map for problem solving anymore, although he used to doing very well.  He occasionally skips taking Vyvanse as he has not noticed any difference even at the higher dose.  He has been feeling more anxious and picking his head.  He sleeps well.  He denies feeling depressed.  He has difficulty in concentration.  He has increased appetite.  He denies SI.  He feels anxious and tense.  He denies panic attacks.  He takes clonazepam for anxiety.  He states that he used to be on Adderall XR up to around 50 mg, which caused tachycardia.  It used to work very well for the patient, and he has been on that for many years.   Vyvanse filled on 04/27/2018 Clonazepam on 2/3  Wt Readings from Last 3 Encounters:  06/04/18 272 lb (123.4 kg)  02/17/18 260 lb (117.9 kg)  12/16/17 247 lb (112 kg)    Visit Diagnosis:    ICD-10-CM   1. Attention deficit hyperactivity disorder (ADHD), unspecified ADHD type F90.9     Past Psychiatric History: Please see initial evaluation for full details. I have reviewed the history. No updates at this time.     Past Medical History:  Past Medical History:  Diagnosis Date  . Hypertension    No past surgical history on file.  Family Psychiatric History: Please see initial  evaluation for full details. I have reviewed the history. No updates at this time.     Family History: No family history on file.  Social History:  Social History   Socioeconomic History  . Marital status: Single    Spouse name: Not on file  . Number of children: Not on file  . Years of education: Not on file  . Highest education level: Not on file  Occupational History  . Not on file  Social Needs  . Financial resource strain: Not on file  . Food insecurity:    Worry: Not on file    Inability: Not on file  . Transportation needs:    Medical: Not on file    Non-medical: Not on file  Tobacco Use  . Smoking status: Never Smoker  . Smokeless tobacco: Never Used  Substance and Sexual Activity  . Alcohol use: Yes    Comment: Socail   . Drug use: No  . Sexual activity: Not on file  Lifestyle  . Physical activity:    Days per week: Not on file    Minutes per session: Not on file  . Stress: Not on file  Relationships  . Social connections:    Talks on phone: Not on file    Gets together: Not on file    Attends religious service:  Not on file    Active member of club or organization: Not on file    Attends meetings of clubs or organizations: Not on file    Relationship status: Not on file  Other Topics Concern  . Not on file  Social History Narrative  . Not on file    Allergies: No Known Allergies  Metabolic Disorder Labs: No results found for: HGBA1C, MPG No results found for: PROLACTIN No results found for: CHOL, TRIG, HDL, CHOLHDL, VLDL, LDLCALC No results found for: TSH  Therapeutic Level Labs: No results found for: LITHIUM No results found for: VALPROATE No components found for:  CBMZ  Current Medications: Current Outpatient Medications  Medication Sig Dispense Refill  . buPROPion (WELLBUTRIN XL) 150 MG 24 hr tablet Take total of 450 mg daily (300 mg + 150 mg ) 90 tablet 0  . buPROPion (WELLBUTRIN XL) 300 MG 24 hr tablet Take total of 450 mg daily (300  mg + 150 mg ) 90 tablet 0  . [START ON 06/22/2018] clonazePAM (KLONOPIN) 2 MG tablet Take 1 tablet (2 mg total) by mouth at bedtime. 30 tablet 0  . ibuprofen (ADVIL,MOTRIN) 200 MG tablet Take 200 mg by mouth 3 (three) times daily as needed.    [name]. levothyroxine (SYNTHROID, LEVOTHROID) 75 MCG tablet Take 75 mcg by mouth daily before breakfast.    . lisdexamfetamine (VYVANSE) 60 MG capsule Take 1 capsule (60 mg total) by mouth every morning. 30 capsule 0  . nebivolol (BYSTOLIC) 10 MG tablet Take 10 mg by mouth daily.    . sertraline (ZOLOFT) 100 MG tablet Take 1.5 tablets (150 mg total) by mouth at bedtime. 135 tablet 0  . amphetamine-dextroamphetamine (ADDERALL XR) 10 MG 24 hr capsule 30 mg daily for one week then 40 mg daily (30 mg + 10 mg) if 30 mg is not effective 30 capsule 0  . amphetamine-dextroamphetamine (ADDERALL XR) 30 MG 24 hr capsule 30 mg daily for one week then 40 mg daily (30 mg + 10 mg) if 30 mg is not effective 30 capsule 0  . lisdexamfetamine (VYVANSE) 50 MG capsule Take 1 capsule (50 mg total) by mouth daily before breakfast. 30 capsule 0   No current facility-administered medications for this visit.      Musculoskeletal: Strength & Muscle Tone: within normal limits Gait & Station: normal Patient leans: N/A  Psychiatric Specialty Exam: Review of Systems  Psychiatric/Behavioral: Negative for depression, hallucinations, memory loss, substance abuse and suicidal ideas. The patient is nervous/anxious. The patient does not have insomnia.   All other systems reviewed and are negative.   Blood pressure (!) 149/100, pulse 100, height 5\' 11"  (1.803 m), weight 272 lb (123.4 kg), SpO2 96 %.Body mass index is 37.94 kg/m.  General Appearance: Fairly Groomed  Eye Contact:  Good  Speech:  Clear and Coherent  Volume:  Normal  Mood:  Anxious  Affect:  Appropriate, Congruent, Restricted and tense  Thought Process:  Coherent  Orientation:  Full (Time, Place, and Person)  Thought  Content: Logical   Suicidal Thoughts:  No  Homicidal Thoughts:  No  Memory:  Immediate;   Good  Judgement:  Good  Insight:  Fair  Psychomotor Activity:  Normal  Concentration:  Concentration: Good and Attention Span: Good  Recall:  Good  Fund of Knowledge: Good  Language: Good  Akathisia:  No  Handed:  Right  AIMS (if indicated): not done  Assets:  Communication Skills Desire for Improvement  ADL's:  Intact  Cognition: WNL  Sleep:  Good   Screenings:   Assessment and Plan:  Melinda Crutchshley Reine is a 38 y.o. year old male with a history of depression, ADHD, hypertension, hyperlipidemia,hypothyroidism, obesity , who presents for follow up appointment for Attention deficit hyperactivity disorder (ADHD), unspecified ADHD type  # ADHD Patient has 3 psychological testing in the past by report, first diagnosed at age 38.  There has been worsening in inattention and disorganization.  Will switch from Vyvanse to Adderall XR given Vyvanse is not effective for the patient anymore.  Discussed potential side effects of tachycardia, hypertension and worsening anxiety especially with concomitant use of bupropion.  He is advised to contact the office if any side effect.   # MDD, moderate, recurrent with anxious distress Patient reports worsening in anxiety in the context of worsening in inattention.  Will continue sertraline and bupropion to target depression.  He has no known history of seizure.  Will continue clonazepam as needed for anxiety.  Discussed risk of dependence and oversedation. He will greatly benefit from CBT; he is planning to seek on his own. He is advised to contact the office if he wants referral.  Plan 1. Discontinue Vyvanse 2. Start Adderall XR 30 mg daily for one week, then 40 mg daily  2.Continue sertraline 150 mg daily 3. Continue Wellbutrin 450 mg daily (150 mg + 300 mg ) 4.  Continue clonazepam 2 mg at night as needed for anxiety  5. Return to clinic inone month for 30  mins  (He was referred to CBT for ADHD; [phone])- will ask him if he followed this up  Past trials of medication:sertraline, Effexor (had "zaps"), Wellbutrin, Adderall, Adderall XR, Vyvanse, Concerta, Strattera,  The patient demonstrates the following risk factors for suicide: Chronic risk factors for suicide include:psychiatric disorder ofdepression. Acute risk factorsfor suicide include: N/A. Protective factorsfor this patient include: positive social support, coping skills and hope for the future. Considering these factors, the overall suicide risk at this point appears to below. Patientisappropriate for outpatient follow up.  The duration of this appointment visit was 25 minutes of face-to-face time with the patient.  Greater than 50% of this time was spent in counseling, explanation of  diagnosis, planning of further management, and coordination of care.  Neysa Hottereina Ellora Varnum, MD 06/04/2018, 11:22 AM

## 2018-06-04 ENCOUNTER — Ambulatory Visit (HOSPITAL_COMMUNITY): Payer: 59 | Admitting: Psychiatry

## 2018-06-04 ENCOUNTER — Encounter (HOSPITAL_COMMUNITY): Payer: Self-pay | Admitting: Psychiatry

## 2018-06-04 VITALS — BP 149/100 | HR 100 | Ht 71.0 in | Wt 272.0 lb

## 2018-06-04 DIAGNOSIS — F909 Attention-deficit hyperactivity disorder, unspecified type: Secondary | ICD-10-CM

## 2018-06-04 MED ORDER — AMPHETAMINE-DEXTROAMPHET ER 30 MG PO CP24: ORAL_CAPSULE | ORAL | 0 refills | Status: DC

## 2018-06-04 MED ORDER — CLONAZEPAM 2 MG PO TABS: 2.0000 mg | ORAL_TABLET | Freq: Every day | ORAL | 0 refills | Status: DC

## 2018-06-04 MED ORDER — AMPHETAMINE-DEXTROAMPHET ER 10 MG PO CP24: ORAL_CAPSULE | ORAL | 0 refills | Status: DC

## 2018-06-04 NOTE — Patient Instructions (Signed)
1. Discontinue Vyvanse 2. Start Adderall XR 30 mg daily for one week, then 40 mg daily  2.Continue sertraline 150 mg daily 3. Continue Wellbutrin 450 mg daily (150 mg + 300 mg ) 4.  Continue clonazepam 2 mg at night as needed for anxiety  5. Return to clinic inone month for 30 mins

## 2018-06-29 NOTE — Progress Notes (Deleted)
BH MD/PA/NP OP Progress Note  06/29/2018 1:00 PM Jon Beck  MRN:  [id number]  Chief Complaint:  HPI: *** hypertension Visit Diagnosis: No diagnosis found.  Past Psychiatric History: Please see initial evaluation for full details. I have reviewed the history. No updates at this time.     Past Medical History:  Past Medical History:  Diagnosis Date  . Hypertension    No past surgical history on file.  Family Psychiatric History: Please see initial evaluation for full details. I have reviewed the history. No updates at this time.     Family History: No family history on file.  Social History:  Social History   Socioeconomic History  . Marital status: Single    Spouse name: Not on file  . Number of children: Not on file  . Years of education: Not on file  . Highest education level: Not on file  Occupational History  . Not on file  Social Needs  . Financial resource strain: Not on file  . Food insecurity:    Worry: Not on file    Inability: Not on file  . Transportation needs:    Medical: Not on file    Non-medical: Not on file  Tobacco Use  . Smoking status: Never Smoker  . Smokeless tobacco: Never Used  Substance and Sexual Activity  . Alcohol use: Yes    Comment: Socail   . Drug use: No  . Sexual activity: Not on file  Lifestyle  . Physical activity:    Days per week: Not on file    Minutes per session: Not on file  . Stress: Not on file  Relationships  . Social connections:    Talks on phone: Not on file    Gets together: Not on file    Attends religious service: Not on file    Active member of club or organization: Not on file    Attends meetings of clubs or organizations: Not on file    Relationship status: Not on file  Other Topics Concern  . Not on file  Social History Narrative  . Not on file    Allergies: No Known Allergies  Metabolic Disorder Labs: No results found for: HGBA1C, MPG No results found for: PROLACTIN No results found  for: CHOL, TRIG, HDL, CHOLHDL, VLDL, LDLCALC No results found for: TSH  Therapeutic Level Labs: No results found for: LITHIUM No results found for: VALPROATE No components found for:  CBMZ  Current Medications: Current Outpatient Medications  Medication Sig Dispense Refill  . amphetamine-dextroamphetamine (ADDERALL XR) 10 MG 24 hr capsule 30 mg daily for one week then 40 mg daily (30 mg + 10 mg) if 30 mg is not effective 30 capsule 0  . amphetamine-dextroamphetamine (ADDERALL XR) 30 MG 24 hr capsule 30 mg daily for one week then 40 mg daily (30 mg + 10 mg) if 30 mg is not effective 30 capsule 0  . buPROPion (WELLBUTRIN XL) 150 MG 24 hr tablet Take total of 450 mg daily (300 mg + 150 mg ) 90 tablet 0  . buPROPion (WELLBUTRIN XL) 300 MG 24 hr tablet Take total of 450 mg daily (300 mg + 150 mg ) 90 tablet 0  . clonazePAM (KLONOPIN) 2 MG tablet Take 1 tablet (2 mg total) by mouth at bedtime. 30 tablet 0  . ibuprofen (ADVIL,MOTRIN) 200 MG tablet Take 200 mg by mouth 3 (three) times daily as needed.    [name] levothyroxine (SYNTHROID, LEVOTHROID) 75 MCG tablet Take 75  mcg by mouth daily before breakfast.    . lisdexamfetamine (VYVANSE) 50 MG capsule Take 1 capsule (50 mg total) by mouth daily before breakfast. 30 capsule 0  . lisdexamfetamine (VYVANSE) 60 MG capsule Take 1 capsule (60 mg total) by mouth every morning. 30 capsule 0  . nebivolol (BYSTOLIC) 10 MG tablet Take 10 mg by mouth daily.    . sertraline (ZOLOFT) 100 MG tablet Take 1.5 tablets (150 mg total) by mouth at bedtime. 135 tablet 0   No current facility-administered medications for this visit.      Musculoskeletal: Strength & Muscle Tone: within normal limits Gait & Station: normal Patient leans: N/A  Psychiatric Specialty Exam: ROS  There were no vitals taken for this visit.There is no height or weight on file to calculate BMI.  General Appearance: Fairly Groomed  Eye Contact:  Good  Speech:  Clear and Coherent  Volume:   Normal  Mood:  {BHH MOOD:22306}  Affect:  {Affect (PAA):22687}  Thought Process:  Coherent  Orientation:  Full (Time, Place, and Person)  Thought Content: Logical   Suicidal Thoughts:  {ST/HT (PAA):22692}  Homicidal Thoughts:  {ST/HT (PAA):22692}  Memory:  Immediate;   Good  Judgement:  {Judgement (PAA):22694}  Insight:  {Insight (PAA):22695}  Psychomotor Activity:  Normal  Concentration:  Concentration: Good and Attention Span: Good  Recall:  Good  Fund of Knowledge: Good  Language: Good  Akathisia:  No  Handed:  Right  AIMS (if indicated): not done  Assets:  Communication Skills Desire for Improvement  ADL's:  Intact  Cognition: WNL  Sleep:  {BHH GOOD/FAIR/POOR:22877}   Screenings:   Assessment and Plan:  Jon Beck is a 38 y.o. year old male with a history of depression, ADHD, hypertension, hyperlipidemia,hypothyroidism, obesity , who presents for follow up appointment for No diagnosis found.  # ADHD  Patient has 3 psychological testing in the past by report, first diagnosed at age 16.  There has been worsening in inattention and disorganization.  Will switch from Vyvanse to Adderall XR given Vyvanse is not effective for the patient anymore.  Discussed potential side effects of tachycardia, hypertension and worsening anxiety especially with concomitant use of bupropion.  He is advised to contact the office if any side effect.   # MDD, moderate, recurrent with anxious distress Patient reports worsening in anxiety in the context of worsening in inattention.  Will continue sertraline and bupropion to target depression.  He has no known history of seizure.  Will continue clonazepam as needed for anxiety.  Discussed risk of dependence and oversedation. He will greatly benefit from CBT; he is planning to seek on his own. He is advised to contact the office if he wants referral.  Plan 1. Discontinue Vyvanse 2. Start Adderall XR 30 mg daily for one week, then 40 mg daily   2.Continue sertraline 150 mg daily 3. Continue Wellbutrin 450 mg daily (150 mg + 300 mg ) 4. Continue clonazepam 2 mg at night as needed for anxiety 5. Return to clinic inone month for 30 mins  (He was referred to CBT for ADHD; [phone])- will ask him if he followed this up  Past trials of medication:sertraline, Effexor (had "zaps"), Wellbutrin, Adderall, Adderall XR, Vyvanse,Concerta,Strattera,  The patient demonstrates the following risk factors for suicide: Chronic risk factors for suicide include:psychiatric disorder ofdepression. Acute risk factorsfor suicide include: N/A. Protective factorsfor this patient include: positive social support, coping skills and hope for the future. Considering these factors, the overall suicide risk at  this point appears to below. Patientisappropriate for outpatient follow up.  [name], MD 06/29/2018, 1:00 PM

## 2018-07-02 ENCOUNTER — Telehealth (HOSPITAL_COMMUNITY): Payer: Self-pay

## 2018-07-02 ENCOUNTER — Other Ambulatory Visit (HOSPITAL_COMMUNITY): Payer: Self-pay | Admitting: Psychiatry

## 2018-07-02 MED ORDER — AMPHETAMINE-DEXTROAMPHET ER 30 MG PO CP24: ORAL_CAPSULE | ORAL | 0 refills | Status: DC

## 2018-07-02 MED ORDER — AMPHETAMINE-DEXTROAMPHET ER 10 MG PO CP24: ORAL_CAPSULE | ORAL | 0 refills | Status: DC

## 2018-07-02 NOTE — Telephone Encounter (Signed)
Medication management - Telephone message left for pt that Dr. Vanetta Shawl had sent in requested Adderall XR orders to his [facility] on [address] and rescheduled him for Monday April 13th, 2020 at 9:00am. Informed patient of need to call back if this time would not work for him but that she did approve moving him for 1 month as he requested and this was his new appointment time.

## 2018-07-02 NOTE — Telephone Encounter (Signed)
Medication management - Pt called stating he had noticed a great improvement taking Adderall XR 30 mg and XR 10 mg.  Wanted to know if he could wait another month to be seen and could just get a refill of each sent in as was seen on 06/04/18 and reports doing much better.  Patient is scheduled for 07/03/18 but would like to reschedule for one month if you agree with this and with sending in a one time refill?  Agreed to call patient back once provider reviewed message.

## 2018-07-02 NOTE — Telephone Encounter (Signed)
Glad to hear the he is doing well. Ordered for another month. Please make sure that he comes back in a month.

## 2018-07-03 ENCOUNTER — Ambulatory Visit (HOSPITAL_COMMUNITY): Payer: 59 | Admitting: Psychiatry

## 2018-07-27 NOTE — Progress Notes (Signed)
Virtual Visit via Video Note  I connected with Jon Beck on 08/03/18 at  9:00 AM EDT by a video enabled telemedicine application and verified that I am speaking with the correct person using two identifiers.   I discussed the limitations of evaluation and management by telemedicine and the availability of in person appointments. The patient expressed understanding and agreed to proceed.    I discussed the assessment and treatment plan with the patient. The patient was provided an opportunity to ask questions and all were answered. The patient agreed with the plan and demonstrated an understanding of the instructions.   The patient was advised to call back or seek an in-person evaluation if the symptoms worsen or if the condition fails to improve as anticipated.  I provided 25 minutes of non-face-to-face time during this encounter.   Neysa Hottereina Jamison Yuhasz, MD    [facility] MD/PA/NP OP Progress Note  08/03/2018 9:34 AM Jon Beck  MRN:  [id number]  Chief Complaint:  Chief Complaint    Follow-up; Depression; Anxiety; ADHD     HPI:  This is a follow-up visit for ADHD and depression.  He states that he has been doing much better after starting Adderall XR.  He passed certification exam few weeks ago; this is the exam he could not do last year.  He has better concentration and motivation.  He denies any concern at work anymore.  He has gotten back on his routine of Keto diet. He has health coach and does regular exercise with his fiance. He lost 11 pounds this month. [place] comorbidity.  He talks with them every day.  He denies feeling depressed.  He denies anxiety; he believes it has been better as he is not behind work anymore.  He has fair motivation and energy.  He denies SI.  He denies panic attacks.  He takes clonazepam before going to bed; he is willing to taper it down in the future.  He denies palpitation, headache or insomnia  after starting Adderall.   Clonazepam filled on 4/10 Adderall XR on 3/12  Visit Diagnosis:    ICD-10-CM   1. Attention deficit hyperactivity disorder (ADHD), unspecified ADHD type F90.9   2. MDD (major depressive disorder), recurrent episode, mild (HCC) F33.0     Past Psychiatric History: Please see initial evaluation for full details. I have reviewed the history. No updates at this time.     Past Medical History:  Past Medical History:  Diagnosis Date  . Hypertension    History reviewed. No pertinent surgical history.  Family Psychiatric History: Please see initial evaluation for full details. I have reviewed the history. No updates at this time.     Family History: History reviewed. No pertinent family history.  Social History:  Social History   Socioeconomic History  . Marital status: Single    Spouse name: Not on file  . Number of children: Not on file  . Years of education: Not on file  . Highest education level: Not on file  Occupational History  . Not on file  Social Needs  . Financial resource strain: Not on file  . Food insecurity:    Worry: Not on file    Inability: Not on file  . Transportation needs:    Medical: Not on file    Non-medical: Not on file  Tobacco Use  . Smoking status: Never Smoker  . Smokeless tobacco: Never Used  Substance and Sexual  Activity  . Alcohol use: Yes    Comment: Socail   . Drug use: No  . Sexual activity: Not on file  Lifestyle  . Physical activity:    Days per week: Not on file    Minutes per session: Not on file  . Stress: Not on file  Relationships  . Social connections:    Talks on phone: Not on file    Gets together: Not on file    Attends religious service: Not on file    Active member of club or organization: Not on file    Attends meetings of clubs or organizations: Not on file    Relationship status: Not on file  Other Topics Concern  . Not on file  Social History Narrative  . Not on file     Allergies: No Known Allergies  Metabolic Disorder Labs: No results found for: HGBA1C, MPG No results found for: PROLACTIN No results found for: CHOL, TRIG, HDL, CHOLHDL, VLDL, LDLCALC No results found for: TSH  Therapeutic Level Labs: No results found for: LITHIUM No results found for: VALPROATE No components found for:  CBMZ  Current Medications: Current Outpatient Medications  Medication Sig Dispense Refill  . amphetamine-dextroamphetamine (ADDERALL XR) 10 MG 24 hr capsule Total of 40 mg (30 mg+10 mg) daily 30 capsule 0  . [START ON 09/02/2018] amphetamine-dextroamphetamine (ADDERALL XR) 10 MG 24 hr capsule Total of 40 mg daily (30 mg + 10 mg) 30 capsule 0  . amphetamine-dextroamphetamine (ADDERALL XR) 30 MG 24 hr capsule Total of 40 mg (30 mg+10 mg) daily 30 capsule 0  . [START ON 09/02/2018] amphetamine-dextroamphetamine (ADDERALL XR) 30 MG 24 hr capsule Total of 40 mg daily (30 mg + 10 mg) 30 capsule 0  . [START ON 08/18/2018] buPROPion (WELLBUTRIN XL) 150 MG 24 hr tablet Take total of 450 mg daily (300 mg + 150 mg ) 90 tablet 0  . [START ON 08/18/2018] buPROPion (WELLBUTRIN XL) 300 MG 24 hr tablet Take total of 450 mg daily (300 mg + 150 mg ) 90 tablet 0  . [START ON 08/30/2018] clonazePAM (KLONOPIN) 2 MG tablet Take 1 tablet (2 mg total) by mouth at bedtime. 30 tablet 0  . ibuprofen (ADVIL,MOTRIN) 200 MG tablet Take 200 mg by mouth 3 (three) times daily as needed.    [name] levothyroxine (SYNTHROID, LEVOTHROID) 75 MCG tablet Take 75 mcg by mouth daily before breakfast.    . nebivolol (BYSTOLIC) 10 MG tablet Take 10 mg by mouth daily.    . sertraline (ZOLOFT) 100 MG tablet Take 1.5 tablets (150 mg total) by mouth at bedtime. 135 tablet 0   No current facility-administered medications for this visit.      Musculoskeletal: Strength & Muscle Tone: N/A Gait & Station: N/A Patient leans: N/A  Psychiatric Specialty Exam: Review of Systems  Psychiatric/Behavioral: Negative for  depression, hallucinations, memory loss, substance abuse and suicidal ideas. The patient is not nervous/anxious and does not have insomnia.   All other systems reviewed and are negative.   There were no vitals taken for this visit.There is no height or weight on file to calculate BMI.  General Appearance: Fairly Groomed  Eye Contact:  Good  Speech:  Clear and Coherent  Volume:  Normal  Mood:  "better"  Affect:  Appropriate, Congruent and calmer  Thought Process:  Coherent  Orientation:  Full (Time, Place, and Person)  Thought Content: Logical   Suicidal Thoughts:  No  Homicidal Thoughts:  No  Memory:  Immediate;   Good  Judgement:  Good  Insight:  Good  Psychomotor Activity:  Normal  Concentration:  Concentration: Good and Attention Span: Good  Recall:  Good  Fund of Knowledge: Good  Language: Good  Akathisia:  No  Handed:  Right  AIMS (if indicated): not done  Assets:  Communication Skills Desire for Improvement  ADL's:  Intact  Cognition: WNL  Sleep:  Good   Screenings:   Assessment and Plan:  Jon Beck is a 38 y.o. year old male with a history of depression, ADHD,  hypertension, hyperlipidemia,hypothyroidism, obesity  , who presents for follow up appointment for Attention deficit hyperactivity disorder (ADHD), unspecified ADHD type  MDD (major depressive disorder), recurrent episode, mild (HCC)  # ADHD Patient reports significant improvement in ADHD symptoms after switching from Vyvanse to Adderall XR. He denies any side effect. Noted that patient had 3 psychological testing in the past by self-report, first diagnosed at age 25.  Will continue current dose of Adderall XR to target ADHD.  Discussed potential side effects, which includes tachycardia, hypertension and worsening anxiety especially with concomitant use of bupropion.   # MDD, mild, recurrent with anxious distress Patient reports significant improvement in depressive symptoms as his attention improves.   Will continue sertraline and bupropion to target depression.  He has no known history of seizure.  Will continue clonazepam as needed for anxiety.  Discussed risk of dependence and oversedation.  He is willing to taper down this medication in the future.   Plan I have reviewed and updated plans as below 2. Continue Adderall XR 40 mg daily  2.Continue sertraline 150 mg daily 3. Continue Bupropion 450 mg daily (150 mg + 300 mg ) 4. Continue clonazepam 2 mg at night as needed for anxiety 5. Next appointment: 6/8 at 9 AM for 30 mins, video visit  Past trials of medication:sertraline, Effexor (had "zaps"), Wellbutrin, Adderall, Adderall XR, Vyvanse,Concerta,Strattera,  The patient demonstrates the following risk factors for suicide: Chronic risk factors for suicide include:psychiatric disorder ofdepression. Acute risk factorsfor suicide include: N/A. Protective factorsfor this patient include: positive social support, coping skills and hope for the future. Considering these factors, the overall suicide risk at this point appears to below. Patientisappropriate for outpatient follow up.  [name], MD 08/03/2018, 9:34 AM

## 2018-07-31 ENCOUNTER — Other Ambulatory Visit (HOSPITAL_COMMUNITY): Payer: Self-pay | Admitting: Psychiatry

## 2018-07-31 MED ORDER — CLONAZEPAM 2 MG PO TABS: 2.0000 mg | ORAL_TABLET | Freq: Every day | ORAL | 0 refills | Status: DC

## 2018-08-03 ENCOUNTER — Encounter (HOSPITAL_COMMUNITY): Payer: Self-pay | Admitting: Psychiatry

## 2018-08-03 ENCOUNTER — Ambulatory Visit (INDEPENDENT_AMBULATORY_CARE_PROVIDER_SITE_OTHER): Payer: 59 | Admitting: Psychiatry

## 2018-08-03 ENCOUNTER — Other Ambulatory Visit: Payer: Self-pay

## 2018-08-03 DIAGNOSIS — F909 Attention-deficit hyperactivity disorder, unspecified type: Secondary | ICD-10-CM

## 2018-08-03 DIAGNOSIS — F330 Major depressive disorder, recurrent, mild: Secondary | ICD-10-CM | POA: Diagnosis not present

## 2018-08-03 DIAGNOSIS — Z79899 Other long term (current) drug therapy: Secondary | ICD-10-CM | POA: Diagnosis not present

## 2018-08-03 MED ORDER — AMPHETAMINE-DEXTROAMPHET ER 10 MG PO CP24: ORAL_CAPSULE | ORAL | 0 refills | Status: DC

## 2018-08-03 MED ORDER — SERTRALINE HCL 100 MG PO TABS: 150.0000 mg | ORAL_TABLET | Freq: Every day | ORAL | 0 refills | Status: DC

## 2018-08-03 MED ORDER — AMPHETAMINE-DEXTROAMPHET ER 30 MG PO CP24: ORAL_CAPSULE | ORAL | 0 refills | Status: DC

## 2018-08-03 MED ORDER — BUPROPION HCL ER (XL) 300 MG PO TB24: ORAL_TABLET | ORAL | 0 refills | Status: DC

## 2018-08-03 MED ORDER — BUPROPION HCL ER (XL) 150 MG PO TB24: ORAL_TABLET | ORAL | 0 refills | Status: DC

## 2018-08-03 MED ORDER — CLONAZEPAM 2 MG PO TABS: 2.0000 mg | ORAL_TABLET | Freq: Every day | ORAL | 0 refills | Status: DC

## 2018-09-17 NOTE — Progress Notes (Signed)
Virtual Visit via Video Note  I connected with Jon Beck on 09/28/18 at  9:00 AM EDT by a video enabled telemedicine application and verified that I am speaking with the correct person using two identifiers.   I discussed the limitations of evaluation and management by telemedicine and the availability of in person appointments. The patient expressed understanding and agreed to proceed.   I discussed the assessment and treatment plan with the patient. The patient was provided an opportunity to ask questions and all were answered. The patient agreed with the plan and demonstrated an understanding of the instructions.   The patient was advised to call back or seek an in-person evaluation if the symptoms worsen or if the condition fails to improve as anticipated.  I provided 15 minutes of non-face-to-face time during this encounter.   [name], MD  [facility] MD/PA/NP OP Progress Note  09/28/2018 9:20 AM Jon Beck  MRN:  [id number]  Chief Complaint:  Chief Complaint    Follow-up; ADHD; Depression     HPI:  This is a follow-up visit for ADHD and depression.  He states that he is visiting his parents in [place] this weekend.  He is planning to go back to [place] today.  He has been doing very well; he believes that he has more attention compared to before.  He denies any difficulties at work.  Although he change of shift, which includes morning and night, he has been trying to wake up at regular time.  He reports good relationship with his fiance.  Although he feels slightly anxious about moving into the new house in 2 months, he is looking forward to it. He has been doing swimming, and is physically active. He continues to work on [employer]. He feels good to have weight loss.  He denies insomnia.  He denies feeling depressed.  He has good motivation and energy.  He has good concentration.  Denies SI.  He denies panic attacks.  He has been taking clonazepam 2 mg every night;  he is willing to try lower dose if able.    Weight 241 lbs, two days ago according to the patient Wt Readings from Last 3 Encounters:  06/04/18 272 lb (123.4 kg)  02/17/18 260 lb (117.9 kg)  12/16/17 247 lb (112 kg)    Visit Diagnosis:    ICD-10-CM   1. Attention deficit hyperactivity disorder (ADHD), unspecified ADHD type F90.9   2. MDD (major depressive disorder), recurrent, in partial remission (HCC) F33.41     Past Psychiatric History: Please see initial evaluation for full details. I have reviewed the history. No updates at this time.     Past Medical History:  Past Medical History:  Diagnosis Date  . Hypertension    No past surgical history on file.  Family Psychiatric History: Please see initial evaluation for full details. I have reviewed the history. No updates at this time.     Family History: No family history on file.  Social History:  Social History   Socioeconomic History  . Marital status: Single    Spouse name: Not on file  . Number of children: Not on file  . Years of education: Not on file  . Highest education level: Not on file  Occupational History  . Not on file  Social Needs  . Financial resource strain: Not on file  . Food insecurity:    Worry: Not on file    Inability: Not on file  . Transportation needs:  Medical: Not on file    Non-medical: Not on file  Tobacco Use  . Smoking status: Never Smoker  . Smokeless tobacco: Never Used  Substance and Sexual Activity  . Alcohol use: Yes    Comment: Socail   . Drug use: No  . Sexual activity: Not on file  Lifestyle  . Physical activity:    Days per week: Not on file    Minutes per session: Not on file  . Stress: Not on file  Relationships  . Social connections:    Talks on phone: Not on file    Gets together: Not on file    Attends religious service: Not on file    Active member of club or organization: Not on file    Attends meetings of clubs or organizations: Not on file     Relationship status: Not on file  Other Topics Concern  . Not on file  Social History Narrative  . Not on file    Allergies: No Known Allergies  Metabolic Disorder Labs: No results found for: HGBA1C, MPG No results found for: PROLACTIN No results found for: CHOL, TRIG, HDL, CHOLHDL, VLDL, LDLCALC No results found for: TSH  Therapeutic Level Labs: No results found for: LITHIUM No results found for: VALPROATE No components found for:  CBMZ  Current Medications: Current Outpatient Medications  Medication Sig Dispense Refill  . [START ON 10/31/2018] amphetamine-dextroamphetamine (ADDERALL XR) 10 MG 24 hr capsule Total of 40 mg (30 mg+10 mg) daily 30 capsule 0  . amphetamine-dextroamphetamine (ADDERALL XR) 10 MG 24 hr capsule Total of 40 mg daily (30 mg + 10 mg) 30 capsule 0  . [START ON 11/30/2018] amphetamine-dextroamphetamine (ADDERALL XR) 10 MG 24 hr capsule Take 1 capsule (10 mg total) by mouth daily. Total of 40 mg daily (30 mg + 10 mg) 30 capsule 0  . [START ON 10/02/2018] amphetamine-dextroamphetamine (ADDERALL XR) 30 MG 24 hr capsule Total of 40 mg (30 mg+10 mg) daily 30 capsule 0  . [START ON 10/31/2018] amphetamine-dextroamphetamine (ADDERALL XR) 30 MG 24 hr capsule Total of 40 mg daily (30 mg + 10 mg) 30 capsule 0  . [START ON 11/30/2018] amphetamine-dextroamphetamine (ADDERALL XR) 30 MG 24 hr capsule Take 1 capsule (30 mg total) by mouth daily. Total of 40 mg daily (30 mg + 10 mg) 30 capsule 0  . [START ON 10/30/2018] buPROPion (WELLBUTRIN XL) 150 MG 24 hr tablet Take total of 450 mg daily (300 mg + 150 mg ) 90 tablet 0  . [START ON 10/30/2018] buPROPion (WELLBUTRIN XL) 300 MG 24 hr tablet Take total of 450 mg daily (300 mg + 150 mg ) 90 tablet 0  . [START ON 09/30/2018] clonazePAM (KLONOPIN) 2 MG tablet Take 1 tablet (2 mg total) by mouth at bedtime. 30 tablet 2  . ibuprofen (ADVIL,MOTRIN) 200 MG tablet Take 200 mg by mouth 3 (three) times daily as needed.    [name] levothyroxine  (SYNTHROID, LEVOTHROID) 75 MCG tablet Take 75 mcg by mouth daily before breakfast.    . nebivolol (BYSTOLIC) 10 MG tablet Take 10 mg by mouth daily.    Jon Beck ON 10/30/2018] sertraline (ZOLOFT) 100 MG tablet Take 1.5 tablets (150 mg total) by mouth at bedtime. 135 tablet 0   No current facility-administered medications for this visit.      Musculoskeletal: Strength & Muscle Tone: N/A Gait & Station: N/A Patient leans: N/A  Psychiatric Specialty Exam: Review of Systems  Psychiatric/Behavioral: Negative for depression, hallucinations, memory loss,  substance abuse and suicidal ideas. The patient is nervous/anxious. The patient does not have insomnia.   All other systems reviewed and are negative.   There were no vitals taken for this visit.There is no height or weight on file to calculate BMI.  General Appearance: Fairly Groomed  Eye Contact:  Good  Speech:  Clear and Coherent  Volume:  Normal  Mood:  "good"  Affect:  Appropriate, Congruent and euthymic  Thought Process:  Coherent  Orientation:  Full (Time, Place, and Person)  Thought Content: Logical   Suicidal Thoughts:  No  Homicidal Thoughts:  No  Memory:  Immediate;   Good  Judgement:  Good  Insight:  Good  Psychomotor Activity:  Normal  Concentration:  Concentration: Good and Attention Span: Good  Recall:  Good  Fund of Knowledge: Good  Language: Good  Akathisia:  No  Handed:  Right  AIMS (if indicated): not done  Assets:  Communication Skills Desire for Improvement  ADL's:  Intact  Cognition: WNL  Sleep:  Good   Screenings:   Assessment and Plan:  Jon Crutchshley Sinagra is a 38 y.o. year old male with a history of depression, ADHD, hypertension, hyperlipidemia,hypothyroidism, obesity , who presents for follow up appointment for Attention deficit hyperactivity disorder (ADHD), unspecified ADHD type  MDD (major depressive disorder), recurrent, in partial remission (HCC)  # ADHD There has been steady improvement in  ADHD symptoms after switching from Vyvanse to Adderall XR. Noted that patient had 3 psychological testing in the past by self-report, first diagnosed at age 89.   Will continue current dose of Adderall XR to target ADHD.  Discussed potential side effects, which includes tachycardia, anxiety, and worsening in anxiety especially with concomitant use of bupropion.   # MDD, recurrent in partial remission Has been steady improvement in depressive symptoms as his attention improves.  Will continue sertraline bupropion to target depression.  He has no known history of seizure.  Will continue clonazepam as needed for anxiety.  Discussed risk of dependence and oversedation.  He is willing to try tapering off if able.    Plan I have reviewed and updated plans as below 1. Continue Adderall XR 40 mg daily   2.Continue sertraline 150 mg daily 3. Continue Bupropion 450 mg daily (150 mg + 300 mg ) 4.Continue clonazepam 2 mg at night as needed for anxiety  5. Next appointment: 8/24 at 10 AM for 20 mins, video  Past trials of medication:sertraline, Effexor (had "zaps"), Wellbutrin, Adderall, Adderall XR, Vyvanse,Concerta,Strattera,  The patient demonstrates the following risk factors for suicide: Chronic risk factors for suicide include:psychiatric disorder ofdepression. Acute risk factorsfor suicide include: N/A. Protective factorsfor this patient include: positive social support, coping skills and hope for the future. Considering these factors, the overall suicide risk at this point appears to below. Patientisappropriate for outpatient follow up.  Neysa Hottereina Kingstin Heims, MD 09/28/2018, 9:20 AM

## 2018-09-28 ENCOUNTER — Encounter (HOSPITAL_COMMUNITY): Payer: Self-pay | Admitting: Psychiatry

## 2018-09-28 ENCOUNTER — Other Ambulatory Visit: Payer: Self-pay

## 2018-09-28 ENCOUNTER — Ambulatory Visit (INDEPENDENT_AMBULATORY_CARE_PROVIDER_SITE_OTHER): Payer: 59 | Admitting: Psychiatry

## 2018-09-28 DIAGNOSIS — F909 Attention-deficit hyperactivity disorder, unspecified type: Secondary | ICD-10-CM

## 2018-09-28 DIAGNOSIS — F3341 Major depressive disorder, recurrent, in partial remission: Secondary | ICD-10-CM

## 2018-09-28 MED ORDER — BUPROPION HCL ER (XL) 150 MG PO TB24: ORAL_TABLET | ORAL | 0 refills | Status: DC

## 2018-09-28 MED ORDER — AMPHETAMINE-DEXTROAMPHET ER 30 MG PO CP24: ORAL_CAPSULE | ORAL | 0 refills | Status: DC

## 2018-09-28 MED ORDER — AMPHETAMINE-DEXTROAMPHET ER 10 MG PO CP24: 10.0000 mg | ORAL_CAPSULE | Freq: Every day | ORAL | 0 refills | Status: DC

## 2018-09-28 MED ORDER — AMPHETAMINE-DEXTROAMPHET ER 30 MG PO CP24: 30.0000 mg | ORAL_CAPSULE | Freq: Every day | ORAL | 0 refills | Status: DC

## 2018-09-28 MED ORDER — AMPHETAMINE-DEXTROAMPHET ER 10 MG PO CP24: ORAL_CAPSULE | ORAL | 0 refills | Status: DC

## 2018-09-28 MED ORDER — CLONAZEPAM 2 MG PO TABS: 2.0000 mg | ORAL_TABLET | Freq: Every day | ORAL | 2 refills | Status: DC

## 2018-09-28 MED ORDER — SERTRALINE HCL 100 MG PO TABS: 150.0000 mg | ORAL_TABLET | Freq: Every day | ORAL | 0 refills | Status: DC

## 2018-09-28 MED ORDER — BUPROPION HCL ER (XL) 300 MG PO TB24: ORAL_TABLET | ORAL | 0 refills | Status: DC

## 2018-09-28 NOTE — Patient Instructions (Signed)
1. Continue Adderall XR 40 mg daily   2.Continue sertraline 150 mg daily 3. Continue Bupropion 450 mg daily (150 mg + 300 mg ) 4.Continue clonazepam 2 mg at night as needed for anxiety  5. Next appointment: 8/24 at 10 AM for 20 mins, video

## 2018-12-07 NOTE — Progress Notes (Signed)
Virtual Visit via Video Note  I connected with Jon Beck on 12/14/18 at 10:00 AM EDT by a video enabled telemedicine application and verified that I am speaking with the correct person using two identifiers.   I discussed the limitations of evaluation and management by telemedicine and the availability of in person appointments. The patient expressed understanding and agreed to proceed.     I discussed the assessment and treatment plan with the patient. The patient was provided an opportunity to ask questions and all were answered. The patient agreed with the plan and demonstrated an understanding of the instructions.   The patient was advised to call back or seek an in-person evaluation if the symptoms worsen or if the condition fails to improve as anticipated.  I provided 15 minutes of non-face-to-face time during this encounter.   Norman Clay, MD    [facility] MD/PA/NP OP Progress Note  12/14/2018 10:15 AM Jon Beck  MRN:  [id number]  Chief Complaint:  Chief Complaint    ADHD; Anxiety; Follow-up     HPI:  This is a follow-up appointment for ADHD and depression.  He states that he has been doing well.  He has better concentration; he could pass certification exam. He denies any issues at work. He bought a house. It will be a larger house and his parents in [place] will move in after remodeling. He states that his mother has early stage of dementia.  He feels fortunate to find the house, which is also closer to his brothers.  He states that he has been preparing it for a while, although he did not expect this to be happen soon.  He states that [name], his fiance is also agree with this plan.  Although [name] may be upset at times due to recent change in her work secondary to pandemic, he is calmer and has more patience with her. He feels that things are in good control.  He sleeps well.  He denies feeling depressed.  He has good appetite.  Although he gained 12 pounds, he has been  working on diet and exercise. He denies SI. He denies anxiety or panic attacks. He feels "ill" if he does not take clonazepam for a few days. However, he is willing to taper it off after he moves into a new house/situated.   246 lbs,   Visit Diagnosis:    ICD-10-CM   1. Attention deficit hyperactivity disorder (ADHD), unspecified ADHD type  F90.9   2. MDD (major depressive disorder), recurrent, in full remission ([place])  F33.42     Past Psychiatric History: Please see initial evaluation for full details. I have reviewed the history. No updates at this time.     Past Medical History:  Past Medical History:  Diagnosis Date  . Hypertension    History reviewed. No pertinent surgical history.  Family Psychiatric History: Please see initial evaluation for full details. I have reviewed the history. No updates at this time.     Family History: History reviewed. No pertinent family history.  Social History:  Social History   Socioeconomic History  . Marital status: Single    Spouse name: Not on file  . Number of children: Not on file  . Years of education: Not on file  . Highest education level: Not on file  Occupational History  . Not on file  Social Needs  . Financial resource strain: Not on file  . Food insecurity    Worry: Not on file    Inability:  Not on file  . Transportation needs    Medical: Not on file    Non-medical: Not on file  Tobacco Use  . Smoking status: Never Smoker  . Smokeless tobacco: Never Used  Substance and Sexual Activity  . Alcohol use: Yes    Comment: Socail   . Drug use: No  . Sexual activity: Not on file  Lifestyle  . Physical activity    Days per week: Not on file    Minutes per session: Not on file  . Stress: Not on file  Relationships  . Social Musicianconnections    Talks on phone: Not on file    Gets together: Not on file    Attends religious service: Not on file    Active member of club or organization: Not on file    Attends meetings of  clubs or organizations: Not on file    Relationship status: Not on file  Other Topics Concern  . Not on file  Social History Narrative  . Not on file    Allergies: No Known Allergies  Metabolic Disorder Labs: No results found for: HGBA1C, MPG No results found for: PROLACTIN No results found for: CHOL, TRIG, HDL, CHOLHDL, VLDL, LDLCALC No results found for: TSH  Therapeutic Level Labs: No results found for: LITHIUM No results found for: VALPROATE No components found for:  CBMZ  Current Medications: Current Outpatient Medications  Medication Sig Dispense Refill  . amphetamine-dextroamphetamine (ADDERALL XR) 10 MG 24 hr capsule Take 1 capsule (10 mg total) by mouth daily. Total of 40 mg daily (30 mg + 10 mg) 30 capsule 0  . [START ON 01/08/2019] amphetamine-dextroamphetamine (ADDERALL XR) 10 MG 24 hr capsule Total of 40 mg (30 mg+10 mg) daily 30 capsule 0  . [START ON 02/06/2019] amphetamine-dextroamphetamine (ADDERALL XR) 10 MG 24 hr capsule Total of 40 mg daily (30 mg + 10 mg) 30 capsule 0  . amphetamine-dextroamphetamine (ADDERALL XR) 30 MG 24 hr capsule Take 1 capsule (30 mg total) by mouth daily. Total of 40 mg daily (30 mg + 10 mg) 30 capsule 0  . [START ON 01/09/2019] amphetamine-dextroamphetamine (ADDERALL XR) 30 MG 24 hr capsule Total of 40 mg (30 mg+10 mg) daily 30 capsule 0  . [START ON 02/08/2019] amphetamine-dextroamphetamine (ADDERALL XR) 30 MG 24 hr capsule Total of 40 mg daily (30 mg + 10 mg) 30 capsule 0  . [START ON 01/30/2019] buPROPion (WELLBUTRIN XL) 150 MG 24 hr tablet Take total of 450 mg daily (300 mg + 150 mg ) 90 tablet 0  . [START ON 01/30/2019] buPROPion (WELLBUTRIN XL) 300 MG 24 hr tablet Take total of 450 mg daily (300 mg + 150 mg ) 90 tablet 0  . clonazePAM (KLONOPIN) 2 MG tablet Take 1 tablet (2 mg total) by mouth at bedtime. 30 tablet 2  . ibuprofen (ADVIL,MOTRIN) 200 MG tablet Take 200 mg by mouth 3 (three) times daily as needed.    [name]. levothyroxine  (SYNTHROID, LEVOTHROID) 75 MCG tablet Take 75 mcg by mouth daily before breakfast. 1.5 tabs on weekends    . nebivolol (BYSTOLIC) 10 MG tablet Take 10 mg by mouth daily.    [name]. [START ON 01/29/2019] sertraline (ZOLOFT) 100 MG tablet Take 1.5 tablets (150 mg total) by mouth at bedtime. 135 tablet 0   No current facility-administered medications for this visit.      Musculoskeletal: Strength & Muscle Tone: N/A Gait & Station: N/A Patient leans: N/A  Psychiatric Specialty Exam: Review of Systems  Psychiatric/Behavioral: Negative for depression, hallucinations, memory loss, substance abuse and suicidal ideas. The patient is not nervous/anxious and does not have insomnia.   All other systems reviewed and are negative.   There were no vitals taken for this visit.There is no height or weight on file to calculate BMI.  General Appearance: Fairly Groomed  Eye Contact:  Good  Speech:  Clear and Coherent  Volume:  Normal  Mood:  "good"  Affect:  Appropriate, Congruent and euthymic, calmer  Thought Process:  Coherent  Orientation:  Full (Time, Place, and Person)  Thought Content: Logical   Suicidal Thoughts:  No  Homicidal Thoughts:  No  Memory:  Immediate;   Good  Judgement:  Good  Insight:  Good  Psychomotor Activity:  Normal  Concentration:  Concentration: Good and Attention Span: Good  Recall:  Good  Fund of Knowledge: Good  Language: Good  Akathisia:  No  Handed:  Right  AIMS (if indicated): not done  Assets:  Communication Skills Desire for Improvement  ADL's:  Intact  Cognition: WNL  Sleep:  Good   Screenings:   Assessment and Plan:  Melinda Crutchshley Seltzer is a 38 y.o. year old male with a history of depression, ADHD, , hypertension, hyperlipidemia,hypothyroidism, obesity , who presents for follow up appointment for ADHD, depression.   # ADHD There has been steady improvement in ADHD symptoms after switching from Vyvanse to Adderall XR.  He has been tolerating this medication  well without any side effect.  Noted that patient has 3 psychological testing in the past according to self-report, first diagnosed at age 38.  Will continue current dose of Adderall to target ADHD.  Discussed potential side effects, which includes tachycardia, anxiety especially with concomitant use of bupropion.   # MDD, recurrent in full remission He denies any significant mood symptoms since the last.  Will continue sertraline to target depression.  Will continue bupropion as adjunctive treatment for depression.  He has no known history of seizure.  Will continue clonazepam as needed for anxiety.  Discussed risk of dependence and oversedation.  He is willing to try tapering it off after he completed the relocation.   Plan I have reviewed and updated plans as below 1.ContinueAdderall XR 40 mg daily  12/09/2018  2.Continue sertraline 150 mg daily 3. ContinueBupropion450 mg daily (150 mg + 300 mg ) 4.Continue clonazepam 2 mg at night as needed for anxiety  5.Next appointment: 11/6 at 8:40 for 20 mins, video  Past trials of medication:sertraline, Effexor (had "zaps"), Wellbutrin, Adderall, Adderall XR, Vyvanse,Concerta,Strattera,  The patient demonstrates the following risk factors for suicide: Chronic risk factors for suicide include:psychiatric disorder ofdepression. Acute risk factorsfor suicide include: N/A. Protective factorsfor this patient include: positive social support, coping skills and hope for the future. Considering these factors, the overall suicide risk at this point appears to below. Patientisappropriate for outpatient follow up.  Neysa Hottereina Gail Vendetti, MD 12/14/2018, 10:15 AM

## 2018-12-14 ENCOUNTER — Ambulatory Visit (INDEPENDENT_AMBULATORY_CARE_PROVIDER_SITE_OTHER): Payer: 59 | Admitting: Psychiatry

## 2018-12-14 ENCOUNTER — Encounter (HOSPITAL_COMMUNITY): Payer: Self-pay | Admitting: Psychiatry

## 2018-12-14 ENCOUNTER — Other Ambulatory Visit: Payer: Self-pay

## 2018-12-14 DIAGNOSIS — F909 Attention-deficit hyperactivity disorder, unspecified type: Secondary | ICD-10-CM

## 2018-12-14 DIAGNOSIS — F3342 Major depressive disorder, recurrent, in full remission: Secondary | ICD-10-CM | POA: Diagnosis not present

## 2018-12-14 MED ORDER — AMPHETAMINE-DEXTROAMPHET ER 10 MG PO CP24: ORAL_CAPSULE | ORAL | 0 refills | Status: DC

## 2018-12-14 MED ORDER — BUPROPION HCL ER (XL) 150 MG PO TB24: ORAL_TABLET | ORAL | 0 refills | Status: DC

## 2018-12-14 MED ORDER — AMPHETAMINE-DEXTROAMPHET ER 30 MG PO CP24: ORAL_CAPSULE | ORAL | 0 refills | Status: DC

## 2018-12-14 MED ORDER — BUPROPION HCL ER (XL) 300 MG PO TB24: ORAL_TABLET | ORAL | 0 refills | Status: DC

## 2018-12-14 MED ORDER — SERTRALINE HCL 100 MG PO TABS: 150.0000 mg | ORAL_TABLET | Freq: Every day | ORAL | 0 refills | Status: DC

## 2018-12-14 NOTE — Patient Instructions (Signed)
1.ContinueAdderall XR 40 mg daily 2.Continue sertraline 150 mg daily 3. ContinueBupropion450 mg daily (150 mg + 300 mg ) 4.Continue clonazepam 2 mg at night as needed for anxiety  5.Next appointment: 11/6 at 8:4

## 2019-01-13 ENCOUNTER — Other Ambulatory Visit (HOSPITAL_COMMUNITY): Payer: Self-pay | Admitting: Psychiatry

## 2019-01-13 MED ORDER — CLONAZEPAM 2 MG PO TABS: 2.0000 mg | ORAL_TABLET | Freq: Every day | ORAL | 1 refills | Status: DC

## 2019-02-19 NOTE — Progress Notes (Deleted)
[facility] MD/PA/NP OP Progress Note  02/19/2019 10:06 AM Jon Beck  MRN:  [id number]  Chief Complaint:  HPI: *** Visit Diagnosis: No diagnosis found.  Past Psychiatric History: Please see initial evaluation for full details. I have reviewed the history. No updates at this time.     Past Medical History:  Past Medical History:  Diagnosis Date  . Hypertension    No past surgical history on file.  Family Psychiatric History: Please see initial evaluation for full details. I have reviewed the history. No updates at this time.     Family History: No family history on file.  Social History:  Social History   Socioeconomic History  . Marital status: Single    Spouse name: Not on file  . Number of children: Not on file  . Years of education: Not on file  . Highest education level: Not on file  Occupational History  . Not on file  Social Needs  . Financial resource strain: Not on file  . Food insecurity    Worry: Not on file    Inability: Not on file  . Transportation needs    Medical: Not on file    Non-medical: Not on file  Tobacco Use  . Smoking status: Never Smoker  . Smokeless tobacco: Never Used  Substance and Sexual Activity  . Alcohol use: Yes    Comment: Socail   . Drug use: No  . Sexual activity: Not on file  Lifestyle  . Physical activity    Days per week: Not on file    Minutes per session: Not on file  . Stress: Not on file  Relationships  . Social Herbalist on phone: Not on file    Gets together: Not on file    Attends religious service: Not on file    Active member of club or organization: Not on file    Attends meetings of clubs or organizations: Not on file    Relationship status: Not on file  Other Topics Concern  . Not on file  Social History Narrative  . Not on file    Allergies: No Known Allergies  Metabolic Disorder Labs: No results found for: HGBA1C, MPG No results found for: PROLACTIN No results found for: CHOL, TRIG,  HDL, CHOLHDL, VLDL, LDLCALC No results found for: TSH  Therapeutic Level Labs: No results found for: LITHIUM No results found for: VALPROATE No components found for:  CBMZ  Current Medications: Current Outpatient Medications  Medication Sig Dispense Refill  . amphetamine-dextroamphetamine (ADDERALL XR) 10 MG 24 hr capsule Take 1 capsule (10 mg total) by mouth daily. Total of 40 mg daily (30 mg + 10 mg) 30 capsule 0  . amphetamine-dextroamphetamine (ADDERALL XR) 10 MG 24 hr capsule Total of 40 mg (30 mg+10 mg) daily 30 capsule 0  . amphetamine-dextroamphetamine (ADDERALL XR) 10 MG 24 hr capsule Total of 40 mg daily (30 mg + 10 mg) 30 capsule 0  . amphetamine-dextroamphetamine (ADDERALL XR) 30 MG 24 hr capsule Take 1 capsule (30 mg total) by mouth daily. Total of 40 mg daily (30 mg + 10 mg) 30 capsule 0  . amphetamine-dextroamphetamine (ADDERALL XR) 30 MG 24 hr capsule Total of 40 mg (30 mg+10 mg) daily 30 capsule 0  . amphetamine-dextroamphetamine (ADDERALL XR) 30 MG 24 hr capsule Total of 40 mg daily (30 mg + 10 mg) 30 capsule 0  . buPROPion (WELLBUTRIN XL) 150 MG 24 hr tablet Take total of 450 mg daily (300  mg + 150 mg ) 90 tablet 0  . buPROPion (WELLBUTRIN XL) 300 MG 24 hr tablet Take total of 450 mg daily (300 mg + 150 mg ) 90 tablet 0  . clonazePAM (KLONOPIN) 2 MG tablet Take 1 tablet (2 mg total) by mouth at bedtime. 30 tablet 1  . ibuprofen (ADVIL,MOTRIN) 200 MG tablet Take 200 mg by mouth 3 (three) times daily as needed.    [name] levothyroxine (SYNTHROID, LEVOTHROID) 75 MCG tablet Take 75 mcg by mouth daily before breakfast. 1.5 tabs on weekends    . nebivolol (BYSTOLIC) 10 MG tablet Take 10 mg by mouth daily.    . sertraline (ZOLOFT) 100 MG tablet Take 1.5 tablets (150 mg total) by mouth at bedtime. 135 tablet 0   No current facility-administered medications for this visit.      Musculoskeletal: Strength & Muscle Tone: N/A Gait & Station: N/A Patient leans: N/A  Psychiatric  Specialty Exam: ROS  There were no vitals taken for this visit.There is no height or weight on file to calculate BMI.  General Appearance: Fairly Groomed  Eye Contact:  Good  Speech:  Clear and Coherent  Volume:  Normal  Mood:  {BHH MOOD:22306}  Affect:  {Affect (PAA):22687}  Thought Process:  Coherent  Orientation:  Full (Time, Place, and Person)  Thought Content: Logical   Suicidal Thoughts:  {ST/HT (PAA):22692}  Homicidal Thoughts:  {ST/HT (PAA):22692}  Memory:  Immediate;   Good  Judgement:  {Judgement (PAA):22694}  Insight:  {Insight (PAA):22695}  Psychomotor Activity:  Normal  Concentration:  Concentration: Good and Attention Span: Good  Recall:  Good  Fund of Knowledge: Good  Language: Good  Akathisia:  No  Handed:  Right  AIMS (if indicated): not done  Assets:  Communication Skills Desire for Improvement  ADL's:  Intact  Cognition: WNL  Sleep:  {BHH GOOD/FAIR/POOR:22877}   Screenings:   Assessment and Plan:  Jon Beck is a 38 y.o. year old male with a history of depression, ADHD, hypertension, hyperlipidemia,hypothyroidism, obesity , who presents for follow up appointment for No diagnosis found.  # ADHD  # ADHD There has been steady improvement in ADHD symptoms after switching from Vyvanse to Adderall XR.  He has been tolerating this medication well without any side effect.  Noted that patient has 3 psychological testing in the past according to self-report, first diagnosed at age 54.  Will continue current dose of Adderall to target ADHD.  Discussed potential side effects, which includes tachycardia, anxiety especially with concomitant use of bupropion.   #MDD, recurrent in remission He denies any significant mood symptoms since the last.  Will continue sertraline to target depression.  Will continue bupropion as adjunctive treatment for depression.  He has no known history of seizure.  Will continue clonazepam as needed for anxiety.  Discussed risk of  dependence and oversedation.  He is willing to try tapering it off after he completed the relocation.   Plan  1.ContinueAdderall XR 40 mg daily 12/09/2018  2.Continue sertraline 150 mg daily 3. ContinueBupropion450 mg daily (150 mg + 300 mg ) 4.Continue clonazepam 2 mg at night as needed for anxiety  5.Next appointment:11/6 at 8:40 for 20 mins, video  Past trials of medication:sertraline, Effexor (had "zaps"), Wellbutrin, Adderall, Adderall XR, Vyvanse,Concerta,Strattera,  The patient demonstrates the following risk factors for suicide: Chronic risk factors for suicide include:psychiatric disorder ofdepression. Acute risk factorsfor suicide include: N/A. Protective factorsfor this patient include: positive social support, coping skills and hope for the future.  Considering these factors, the overall suicide risk at this point appears to below. Patientisappropriate for outpatient follow up.  Neysa Hottereina Daevion Navarette, MD 02/19/2019, 10:06 AM

## 2019-02-26 ENCOUNTER — Other Ambulatory Visit: Payer: Self-pay

## 2019-02-26 ENCOUNTER — Ambulatory Visit (HOSPITAL_COMMUNITY): Payer: 59 | Admitting: Psychiatry

## 2019-03-16 ENCOUNTER — Other Ambulatory Visit (HOSPITAL_COMMUNITY): Payer: Self-pay | Admitting: Psychiatry

## 2019-03-16 ENCOUNTER — Telehealth (HOSPITAL_COMMUNITY): Payer: Self-pay

## 2019-03-16 MED ORDER — CLONAZEPAM 2 MG PO TABS: 2.0000 mg | ORAL_TABLET | Freq: Every day | ORAL | 1 refills | Status: DC

## 2019-03-16 NOTE — Telephone Encounter (Signed)
This is a patient of Dr. Modesta Messing. Patient called requesting a refill on his Adderall XR 10mg  and his Adderall XR 30mg  to be sent to [facility] on [address] in [place]. Thank you.

## 2019-03-17 ENCOUNTER — Other Ambulatory Visit (HOSPITAL_COMMUNITY): Payer: Self-pay | Admitting: Psychiatry

## 2019-03-17 MED ORDER — AMPHETAMINE-DEXTROAMPHET ER 10 MG PO CP24: ORAL_CAPSULE | ORAL | 0 refills | Status: DC

## 2019-03-17 MED ORDER — AMPHETAMINE-DEXTROAMPHET ER 30 MG PO CP24: 30.0000 mg | ORAL_CAPSULE | Freq: Every day | ORAL | 0 refills | Status: DC

## 2019-03-17 NOTE — Telephone Encounter (Signed)
sent 

## 2019-03-17 NOTE — Telephone Encounter (Signed)
Notified patient.

## 2019-03-29 ENCOUNTER — Ambulatory Visit (HOSPITAL_COMMUNITY): Payer: 59 | Admitting: Psychiatry

## 2019-04-02 ENCOUNTER — Encounter: Payer: Self-pay | Admitting: Psychiatry

## 2019-04-02 ENCOUNTER — Ambulatory Visit (INDEPENDENT_AMBULATORY_CARE_PROVIDER_SITE_OTHER): Payer: 59 | Admitting: Psychiatry

## 2019-04-02 ENCOUNTER — Other Ambulatory Visit: Payer: Self-pay

## 2019-04-02 DIAGNOSIS — F3342 Major depressive disorder, recurrent, in full remission: Secondary | ICD-10-CM

## 2019-04-02 DIAGNOSIS — F909 Attention-deficit hyperactivity disorder, unspecified type: Secondary | ICD-10-CM | POA: Diagnosis not present

## 2019-04-02 MED ORDER — BUPROPION HCL ER (XL) 300 MG PO TB24: ORAL_TABLET | ORAL | 0 refills | Status: DC

## 2019-04-02 MED ORDER — AMPHETAMINE-DEXTROAMPHET ER 20 MG PO CP24: ORAL_CAPSULE | ORAL | 0 refills | Status: DC

## 2019-04-02 MED ORDER — CLONAZEPAM 2 MG PO TABS: 2.0000 mg | ORAL_TABLET | Freq: Every day | ORAL | 2 refills | Status: DC

## 2019-04-02 MED ORDER — SERTRALINE HCL 100 MG PO TABS: 150.0000 mg | ORAL_TABLET | Freq: Every day | ORAL | 0 refills | Status: DC

## 2019-04-02 MED ORDER — BUPROPION HCL ER (XL) 150 MG PO TB24: ORAL_TABLET | ORAL | 0 refills | Status: DC

## 2019-04-02 NOTE — Progress Notes (Signed)
BH MD OP Progress Note  I connected with  Jon CrutchAshley Somers on 04/02/19 by a video enabled telemedicine application and verified that I am speaking with the correct person using two identifiers.   I discussed the limitations of evaluation and management by telemedicine. The patient expressed understanding and agreed to proceed.    04/02/2019 9:59 AM Jon CrutchAshley Beck  MRN:  [id number]  Chief Complaint:  " I am doing pretty good."  HPI: Patient reported doing well on the current regimen combination.  He stated that he has tried cutting down the dose of Klonopin or missing a dose within that results in withdrawals.  He has been using it every night for sleep consistently.  He reported he plans to taper down gradually in the future.  He would like to continue the regimen for now and then discuss the tapering process with Dr. Vanetta ShawlHisada in the future.  He denied any side effects to his regimen.  Visit Diagnosis:    ICD-10-CM   1. MDD (major depressive disorder), recurrent, in full remission (HCC)  F33.42 buPROPion (WELLBUTRIN XL) 150 MG 24 hr tablet    buPROPion (WELLBUTRIN XL) 300 MG 24 hr tablet    clonazePAM (KLONOPIN) 2 MG tablet    sertraline (ZOLOFT) 100 MG tablet  2. Attention deficit hyperactivity disorder (ADHD), unspecified ADHD type  F90.9 amphetamine-dextroamphetamine (ADDERALL XR) 20 MG 24 hr capsule    amphetamine-dextroamphetamine (ADDERALL XR) 20 MG 24 hr capsule    amphetamine-dextroamphetamine (ADDERALL XR) 20 MG 24 hr capsule    Past Psychiatric History: Depression, ADHD  Past Medical History:  Past Medical History:  Diagnosis Date  . Hypertension    No past surgical history on file.    Family History: No family history on file.  Social History:  Social History   Socioeconomic History  . Marital status: Single    Spouse name: Not on file  . Number of children: Not on file  . Years of education: Not on file  . Highest education level: Not on file  Occupational  History  . Not on file  Tobacco Use  . Smoking status: Never Smoker  . Smokeless tobacco: Never Used  Substance and Sexual Activity  . Alcohol use: Yes    Comment: Socail   . Drug use: No  . Sexual activity: Not on file  Other Topics Concern  . Not on file  Social History Narrative  . Not on file   Social Determinants of Health   Financial Resource Strain:   . Difficulty of Paying Living Expenses: Not on file  Food Insecurity:   . Worried About Programme researcher, broadcasting/film/videounning Out of Food in the Last Year: Not on file  . Ran Out of Food in the Last Year: Not on file  Transportation Needs:   . Lack of Transportation (Medical): Not on file  . Lack of Transportation (Non-Medical): Not on file  Physical Activity:   . Days of Exercise per Week: Not on file  . Minutes of Exercise per Session: Not on file  Stress:   . Feeling of Stress : Not on file  Social Connections:   . Frequency of Communication with Friends and Family: Not on file  . Frequency of Social Gatherings with Friends and Family: Not on file  . Attends Religious Services: Not on file  . Active Member of Clubs or Organizations: Not on file  . Attends BankerClub or Organization Meetings: Not on file  . Marital Status: Not on file    Allergies: No  Known Allergies  Metabolic Disorder Labs: No results found for: HGBA1C, MPG No results found for: PROLACTIN No results found for: CHOL, TRIG, HDL, CHOLHDL, VLDL, LDLCALC No results found for: TSH  Therapeutic Level Labs: No results found for: LITHIUM No results found for: VALPROATE No components found for:  CBMZ  Current Medications: Current Outpatient Medications  Medication Sig Dispense Refill  . amphetamine-dextroamphetamine (ADDERALL XR) 10 MG 24 hr capsule Take 1 capsule (10 mg total) by mouth daily. Total of 40 mg daily (30 mg + 10 mg) 30 capsule 0  . amphetamine-dextroamphetamine (ADDERALL XR) 10 MG 24 hr capsule Total of 40 mg daily (30 mg + 10 mg) 30 capsule 0  .  amphetamine-dextroamphetamine (ADDERALL XR) 10 MG 24 hr capsule Total of 40 mg (30 mg+10 mg) daily 30 capsule 0  . amphetamine-dextroamphetamine (ADDERALL XR) 20 MG 24 hr capsule Take 2 capsules in the morning 60 capsule 0  . [START ON 05/01/2019] amphetamine-dextroamphetamine (ADDERALL XR) 20 MG 24 hr capsule Take 2 capsules in the morning 60 capsule 0  . [START ON 06/01/2019] amphetamine-dextroamphetamine (ADDERALL XR) 20 MG 24 hr capsule Take 2 capsules in the morning 60 capsule 0  . amphetamine-dextroamphetamine (ADDERALL XR) 30 MG 24 hr capsule Total of 40 mg (30 mg+10 mg) daily 30 capsule 0  . amphetamine-dextroamphetamine (ADDERALL XR) 30 MG 24 hr capsule Total of 40 mg daily (30 mg + 10 mg) 30 capsule 0  . amphetamine-dextroamphetamine (ADDERALL XR) 30 MG 24 hr capsule Take 1 capsule (30 mg total) by mouth daily. Total of 40 mg daily (30 mg + 10 mg) 30 capsule 0  . buPROPion (WELLBUTRIN XL) 150 MG 24 hr tablet Take total of 450 mg daily (300 mg + 150 mg ) 90 tablet 0  . buPROPion (WELLBUTRIN XL) 300 MG 24 hr tablet Take total of 450 mg daily (300 mg + 150 mg ) 90 tablet 0  . clonazePAM (KLONOPIN) 2 MG tablet Take 1 tablet (2 mg total) by mouth at bedtime. 30 tablet 2  . ibuprofen (ADVIL,MOTRIN) 200 MG tablet Take 200 mg by mouth 3 (three) times daily as needed.    [name] levothyroxine (SYNTHROID, LEVOTHROID) 75 MCG tablet Take 75 mcg by mouth daily before breakfast. 1.5 tabs on weekends    . nebivolol (BYSTOLIC) 10 MG tablet Take 10 mg by mouth daily.    . sertraline (ZOLOFT) 100 MG tablet Take 1.5 tablets (150 mg total) by mouth at bedtime. 135 tablet 0   No current facility-administered medications for this visit.    Musculoskeletal: Strength & Muscle Tone: unable to assess due to telemed visit Gait & Station: unable to assess due to telemed visit Patient leans: unable to assess due to telemed visit   Psychiatric Specialty Exam: Review of Systems  There were no vitals taken for this  visit.There is no height or weight on file to calculate BMI.  General Appearance: Well Groomed  Eye Contact:  Good  Speech:  Clear and Coherent and Normal Rate  Volume:  Normal  Mood:  Euthymic  Affect:  Congruent  Thought Process:  Goal Directed, Linear and Descriptions of Associations: Intact  Orientation:  Full (Time, Place, and Person)  Thought Content: Logical   Suicidal Thoughts:  No  Homicidal Thoughts:  No  Memory:  Recent;   Good Remote;   Good  Judgement:  Fair  Insight:  Good  Psychomotor Activity:  Normal  Concentration:  Concentration: Good and Attention Span: Good  Recall:  Good  Fund of Knowledge: Good  Language: Good  Akathisia:  Negative  Handed:  Right  AIMS (if indicated): not done  Assets:  Communication Skills Desire for Improvement Financial Resources/Insurance Housing Social Support  ADL's:  Intact  Cognition: WNL  Sleep:  Good     Assessment and Plan: Patient reported doing well on the current regimen.  1. MDD (major depressive disorder), recurrent, in full remission (HCC)  - buPROPion (WELLBUTRIN XL) 150 MG 24 hr tablet; Take total of 450 mg daily (300 mg + 150 mg )  Dispense: 90 tablet; Refill: 0 - buPROPion (WELLBUTRIN XL) 300 MG 24 hr tablet; Take total of 450 mg daily (300 mg + 150 mg )  Dispense: 90 tablet; Refill: 0 - clonazePAM (KLONOPIN) 2 MG tablet; Take 1 tablet (2 mg total) by mouth at bedtime.  Dispense: 30 tablet; Refill: 2 - sertraline (ZOLOFT) 100 MG tablet; Take 1.5 tablets (150 mg total) by mouth at bedtime.  Dispense: 135 tablet; Refill: 0  2. Attention deficit hyperactivity disorder (ADHD), unspecified ADHD type  - amphetamine-dextroamphetamine (ADDERALL XR) 20 MG 24 hr capsule; Take 2 capsules in the morning  Dispense: 60 capsule; Refill: 0 - amphetamine-dextroamphetamine (ADDERALL XR) 20 MG 24 hr capsule; Take 2 capsules in the morning  Dispense: 60 capsule; Refill: 0 - amphetamine-dextroamphetamine (ADDERALL XR) 20 MG 24  hr capsule; Take 2 capsules in the morning  Dispense: 60 capsule; Refill: 0  Continue same medication regimen. Follow up in 3 months.   Zena Amos, MD 04/02/2019, 9:59 AM

## 2019-04-13 ENCOUNTER — Telehealth (HOSPITAL_COMMUNITY): Payer: Self-pay | Admitting: *Deleted

## 2019-04-13 ENCOUNTER — Other Ambulatory Visit (HOSPITAL_COMMUNITY): Payer: Self-pay | Admitting: Psychiatry

## 2019-04-13 MED ORDER — AMPHETAMINE-DEXTROAMPHET ER 30 MG PO CP24: ORAL_CAPSULE | ORAL | 0 refills | Status: DC

## 2019-04-13 MED ORDER — AMPHETAMINE-DEXTROAMPHET ER 10 MG PO CP24: ORAL_CAPSULE | ORAL | 0 refills | Status: DC

## 2019-04-13 NOTE — Telephone Encounter (Signed)
Patient called script sent in was incorrect insurance will cover amphetamine-dextroamphetamine (ADDERALL XR) 20 MG 24 hr capsule as Dr Toy Care prescribed w/out a P.A.  BUT they will cover as Dr Modesta Messing has been prescribed   ContinueAdderall XR 40 mg daily 12/09/2018  30 mg & 10 mg

## 2019-04-13 NOTE — Telephone Encounter (Signed)
sent 

## 2019-06-24 NOTE — Progress Notes (Deleted)
BH MD/PA/NP OP Progress Note  06/24/2019 12:39 PM Jon Beck  MRN:  [id number]  Chief Complaint:  HPI: *** Visit Diagnosis: No diagnosis found.  Past Psychiatric History: Please see initial evaluation for full details. I have reviewed the history. No updates at this time.     Past Medical History:  Past Medical History:  Diagnosis Date  . Hypertension    No past surgical history on file.  Family Psychiatric History: Please see initial evaluation for full details. I have reviewed the history. No updates at this time.     Family History: No family history on file.  Social History:  Social History   Socioeconomic History  . Marital status: Single    Spouse name: Not on file  . Number of children: Not on file  . Years of education: Not on file  . Highest education level: Not on file  Occupational History  . Not on file  Tobacco Use  . Smoking status: Never Smoker  . Smokeless tobacco: Never Used  Substance and Sexual Activity  . Alcohol use: Yes    Comment: Socail   . Drug use: No  . Sexual activity: Not on file  Other Topics Concern  . Not on file  Social History Narrative  . Not on file   Social Determinants of Health   Financial Resource Strain:   . Difficulty of Paying Living Expenses: Not on file  Food Insecurity:   . Worried About Charity fundraiser in the Last Year: Not on file  . Ran Out of Food in the Last Year: Not on file  Transportation Needs:   . Lack of Transportation (Medical): Not on file  . Lack of Transportation (Non-Medical): Not on file  Physical Activity:   . Days of Exercise per Week: Not on file  . Minutes of Exercise per Session: Not on file  Stress:   . Feeling of Stress : Not on file  Social Connections:   . Frequency of Communication with Friends and Family: Not on file  . Frequency of Social Gatherings with Friends and Family: Not on file  . Attends Religious Services: Not on file  . Active Member of Clubs or  Organizations: Not on file  . Attends Archivist Meetings: Not on file  . Marital Status: Not on file    Allergies: No Known Allergies  Metabolic Disorder Labs: No results found for: HGBA1C, MPG No results found for: PROLACTIN No results found for: CHOL, TRIG, HDL, CHOLHDL, VLDL, LDLCALC No results found for: TSH  Therapeutic Level Labs: No results found for: LITHIUM No results found for: VALPROATE No components found for:  CBMZ  Current Medications: Current Outpatient Medications  Medication Sig Dispense Refill  . amphetamine-dextroamphetamine (ADDERALL XR) 10 MG 24 hr capsule Take 1 capsule (10 mg total) by mouth daily. Total of 40 mg daily (30 mg + 10 mg) 30 capsule 0  . amphetamine-dextroamphetamine (ADDERALL XR) 10 MG 24 hr capsule Total of 40 mg (30 mg+10 mg) daily 30 capsule 0  . amphetamine-dextroamphetamine (ADDERALL XR) 10 MG 24 hr capsule Total of 40 mg daily (30 mg + 10 mg) 30 capsule 0  . amphetamine-dextroamphetamine (ADDERALL XR) 20 MG 24 hr capsule Take 2 capsules in the morning 60 capsule 0  . amphetamine-dextroamphetamine (ADDERALL XR) 20 MG 24 hr capsule Take 2 capsules in the morning 60 capsule 0  . amphetamine-dextroamphetamine (ADDERALL XR) 20 MG 24 hr capsule Take 2 capsules in the morning 60 capsule 0  .  amphetamine-dextroamphetamine (ADDERALL XR) 30 MG 24 hr capsule Total of 40 mg daily (30 mg + 10 mg) 30 capsule 0  . amphetamine-dextroamphetamine (ADDERALL XR) 30 MG 24 hr capsule Take 1 capsule (30 mg total) by mouth daily. Total of 40 mg daily (30 mg + 10 mg) 30 capsule 0  . amphetamine-dextroamphetamine (ADDERALL XR) 30 MG 24 hr capsule Total of 40 mg (30 mg+10 mg) daily 30 capsule 0  . buPROPion (WELLBUTRIN XL) 150 MG 24 hr tablet Take total of 450 mg daily (300 mg + 150 mg ) 90 tablet 0  . buPROPion (WELLBUTRIN XL) 300 MG 24 hr tablet Take total of 450 mg daily (300 mg + 150 mg ) 90 tablet 0  . clonazePAM (KLONOPIN) 2 MG tablet Take 1 tablet (2  mg total) by mouth at bedtime. 30 tablet 2  . ibuprofen (ADVIL,MOTRIN) 200 MG tablet Take 200 mg by mouth 3 (three) times daily as needed.    [name] levothyroxine (SYNTHROID, LEVOTHROID) 75 MCG tablet Take 75 mcg by mouth daily before breakfast. 1.5 tabs on weekends    . nebivolol (BYSTOLIC) 10 MG tablet Take 10 mg by mouth daily.    . sertraline (ZOLOFT) 100 MG tablet Take 1.5 tablets (150 mg total) by mouth at bedtime. 135 tablet 0   No current facility-administered medications for this visit.     Musculoskeletal: Strength & Muscle Tone: N/A Gait & Station: N/A Patient leans: N/A  Psychiatric Specialty Exam: Review of Systems  There were no vitals taken for this visit.There is no height or weight on file to calculate BMI.  General Appearance: {Appearance:22683}  Eye Contact:  {[facility] EYE CONTACT:22684}  Speech:  Clear and Coherent  Volume:  Normal  Mood:  {[facility] MOOD:22306}  Affect:  {Affect (PAA):22687}  Thought Process:  Coherent  Orientation:  Full (Time, Place, and Person)  Thought Content: Logical   Suicidal Thoughts:  {ST/HT (PAA):22692}  Homicidal Thoughts:  {ST/HT (PAA):22692}  Memory:  Immediate;   Good  Judgement:  {Judgement (PAA):22694}  Insight:  {Insight (PAA):22695}  Psychomotor Activity:  Normal  Concentration:  Concentration: Good and Attention Span: Good  Recall:  Good  Fund of Knowledge: Good  Language: Good  Akathisia:  No  Handed:  Right  AIMS (if indicated): not done  Assets:  Communication Skills Desire for Improvement  ADL's:  Intact  Cognition: WNL  Sleep:  {B[facility] GOOD/FAIR/POOR:22877}   Screenings:   Assessment and Plan:  Jon Beck is a 39 y.o. year old male with a history of depression, ADHD,hypertension, hyperlipidemia,hypothyroidism, obesity , who presents for follow up appointment for No diagnosis found.  # ADHD  There has been steady improvement in ADHD symptoms after switching from Vyvanse to Adderall XR.  He has been tolerating this  medication well without any side effect.  Noted that patient has 3 psychological testing in the past according to self-report, first diagnosed at age 87.  Will continue current dose of Adderall to target ADHD.  Discussed potential side effects, which includes tachycardia, anxiety especially with concomitant use of bupropion.   # MDD, recurrent in full remission  He denies any significant mood symptoms since the last.  Will continue sertraline to target depression.  Will continue bupropion as adjunctive treatment for depression.  He has no known history of seizure.  Will continue clonazepam as needed for anxiety.  Discussed risk of dependence and oversedation.  He is willing to try tapering it off after he completed the relocation.   Plan  1.ContinueAdderall XR 40 mg daily 12/09/2018  2.Continue sertraline 150 mg daily 3. ContinueBupropion450 mg daily (150 mg + 300 mg ) 4.Continue clonazepam 2 mg at night as needed for anxiety  5.Next appointment:11/6 at 8:40 for 20 mins, video  Past trials of medication:sertraline, Effexor (had "zaps"), Wellbutrin, Adderall, Adderall XR, Vyvanse,Concerta,Strattera,  The patient demonstrates the following risk factors for suicide: Chronic risk factors for suicide include:psychiatric disorder ofdepression. Acute risk factorsfor suicide include: N/A. Protective factorsfor this patient include: positive social support, coping skills and hope for the future. Considering these factors, the overall suicide risk at this point appears to below. Patientisappropriate for outpatient follow up.  [name], MD 06/24/2019, 12:39 PM

## 2019-06-30 NOTE — Progress Notes (Signed)
Virtual Visit via Video Note  I connected with Jon Beck on 07/01/19 at  4:00 PM EST by a video enabled telemedicine application and verified that I am speaking with the correct person using two identifiers.   I discussed the limitations of evaluation and management by telemedicine and the availability of in person appointments. The patient expressed understanding and agreed to proceed.     I discussed the assessment and treatment plan with the patient. The patient was provided an opportunity to ask questions and all were answered. The patient agreed with the plan and demonstrated an understanding of the instructions.   The patient was advised to call back or seek an in-person evaluation if the symptoms worsen or if the condition fails to improve as anticipated.  I provided 15 minutes of non-face-to-face time during this encounter.   [name], MD    [facility] MD/PA/NP OP Progress Note  07/01/2019 4:11 PM Jon Beck  MRN:  [id number]  Chief Complaint:  Chief Complaint    Follow-up; Depression; ADHD     HPI:  This is a follow-up appointment for depression and ADHD.  He states that he has been doing very well.  He bad a review at work yesterday. It was the best he received and there will be a raise. He has been working on yard after moving into the house. His parents visited him a few times, and will eventually move in. He denies feeling depressed.  He has good motivation and energy.  He has good appetite.  He is trying to get back on his routine of working out. He denies SI. He denies anxiety. He feels less irritable. He denies panic attacks. He denies any difficulty with concentration. He is able to complete tasks. He denies any concern/side effect from his medication.  Visit Diagnosis:    ICD-10-CM   1. Attention deficit hyperactivity disorder (ADHD), unspecified ADHD type  F90.9 amphetamine-dextroamphetamine (ADDERALL XR) 20 MG 24 hr capsule    amphetamine-dextroamphetamine  (ADDERALL XR) 20 MG 24 hr capsule    amphetamine-dextroamphetamine (ADDERALL XR) 20 MG 24 hr capsule  2. MDD (major depressive disorder), recurrent, in full remission (HCC)  F33.42 buPROPion (WELLBUTRIN XL) 150 MG 24 hr tablet    buPROPion (WELLBUTRIN XL) 300 MG 24 hr tablet    sertraline (ZOLOFT) 100 MG tablet    clonazePAM (KLONOPIN) 2 MG tablet    Past Psychiatric History: Please see initial evaluation for full details. I have reviewed the history. No updates at this time.     Past Medical History:  Past Medical History:  Diagnosis Date  . Hypertension    No past surgical history on file.  Family Psychiatric History: Please see initial evaluation for full details. I have reviewed the history. No updates at this time.     Family History: No family history on file.  Social History:  Social History   Socioeconomic History  . Marital status: Single    Spouse name: Not on file  . Number of children: Not on file  . Years of education: Not on file  . Highest education level: Not on file  Occupational History  . Not on file  Tobacco Use  . Smoking status: Never Smoker  . Smokeless tobacco: Never Used  Substance and Sexual Activity  . Alcohol use: Yes    Comment: Socail   . Drug use: No  . Sexual activity: Not on file  Other Topics Concern  . Not on file  Social History Narrative  .  Not on file   Social Determinants of Health   Financial Resource Strain:   . Difficulty of Paying Living Expenses:   Food Insecurity:   . Worried About Programme researcher, broadcasting/film/video in the Last Year:   . Barista in the Last Year:   Transportation Needs:   . Freight forwarder (Medical):   [name] Lack of Transportation (Non-Medical):   Physical Activity:   . Days of Exercise per Week:   . Minutes of Exercise per Session:   Stress:   . Feeling of Stress :   Social Connections:   . Frequency of Communication with Friends and Family:   . Frequency of Social Gatherings with Friends and  Family:   . Attends Religious Services:   . Active Member of Clubs or Organizations:   . Attends Banker Meetings:   [name] Marital Status:     Allergies: No Known Allergies  Metabolic Disorder Labs: No results found for: HGBA1C, MPG No results found for: PROLACTIN No results found for: CHOL, TRIG, HDL, CHOLHDL, VLDL, LDLCALC No results found for: TSH  Therapeutic Level Labs: No results found for: LITHIUM No results found for: VALPROATE No components found for:  CBMZ  Current Medications: Current Outpatient Medications  Medication Sig Dispense Refill  . [START ON 07/22/2019] amphetamine-dextroamphetamine (ADDERALL XR) 20 MG 24 hr capsule Take 2 capsules in the morning 60 capsule 0  . [START ON 08/21/2019] amphetamine-dextroamphetamine (ADDERALL XR) 20 MG 24 hr capsule Take 2 capsules in the morning 60 capsule 0  . [START ON 09/21/2019] amphetamine-dextroamphetamine (ADDERALL XR) 20 MG 24 hr capsule Take 2 capsules in the morning 60 capsule 0  . buPROPion (WELLBUTRIN XL) 150 MG 24 hr tablet Take total of 450 mg daily (300 mg + 150 mg ) 90 tablet 0  . buPROPion (WELLBUTRIN XL) 300 MG 24 hr tablet Take total of 450 mg daily (300 mg + 150 mg ) 90 tablet 0  . [START ON 08/11/2019] clonazePAM (KLONOPIN) 2 MG tablet Take 1 tablet (2 mg total) by mouth at bedtime. 30 tablet 2  . ibuprofen (ADVIL,MOTRIN) 200 MG tablet Take 200 mg by mouth 3 (three) times daily as needed.    [name] levothyroxine (SYNTHROID, LEVOTHROID) 75 MCG tablet Take 75 mcg by mouth daily before breakfast. 1.5 tabs on weekends    . nebivolol (BYSTOLIC) 10 MG tablet Take 10 mg by mouth daily.    . sertraline (ZOLOFT) 100 MG tablet Take 1.5 tablets (150 mg total) by mouth at bedtime. 135 tablet 0   No current facility-administered medications for this visit.     Musculoskeletal: Strength & Muscle Tone: N/A Gait & Station: N/A Patient leans: N/A  Psychiatric Specialty Exam: Review of Systems  Psychiatric/Behavioral:  Negative for agitation, behavioral problems, confusion, decreased concentration, dysphoric mood, hallucinations, self-injury, sleep disturbance and suicidal ideas. The patient is not nervous/anxious and is not hyperactive.   All other systems reviewed and are negative.   There were no vitals taken for this visit.There is no height or weight on file to calculate BMI.  General Appearance: Casual  Eye Contact:  Good  Speech:  Clear and Coherent  Volume:  Normal  Mood:  "good"  Affect:  Appropriate, Congruent and Full Range  Thought Process:  Coherent  Orientation:  Full (Time, Place, and Person)  Thought Content: Logical   Suicidal Thoughts:  No  Homicidal Thoughts:  No  Memory:  Immediate;   Good  Judgement:  Good  Insight:  Good  Psychomotor Activity:  Normal  Concentration:  Concentration: Good and Attention Span: Good  Recall:  Good  Fund of Knowledge: Good  Language: Good  Akathisia:  No  Handed:  Right  AIMS (if indicated): not done  Assets:  Communication Skills Desire for Improvement  ADL's:  Intact  Cognition: WNL  Sleep:  Good   Screenings:   Assessment and Plan:  Jon Beck is a 39 y.o. year old male with a history of depression, ADHD< hypertension,  hyperlipidemia,hypothyroidism, obesity, who presents for follow up appointment for Attention deficit hyperactivity disorder (ADHD), unspecified ADHD type - Plan: amphetamine-dextroamphetamine (ADDERALL XR) 20 MG 24 hr capsule, amphetamine-dextroamphetamine (ADDERALL XR) 20 MG 24 hr capsule, amphetamine-dextroamphetamine (ADDERALL XR) 20 MG 24 hr capsule  MDD (major depressive disorder), recurrent, in full remission ([place]) - Plan: buPROPion (WELLBUTRIN XL) 150 MG 24 hr tablet, buPROPion (WELLBUTRIN XL) 300 MG 24 hr tablet, sertraline (ZOLOFT) 100 MG tablet, clonazePAM (KLONOPIN) 2 MG tablet   # ADHD There has been steady improvement in ADHD symptoms on his current medication regimen.  Noted that he had 3  psychological testing in the past, first diagnosed at age 58 according to the patient.  Will continue current dose of Adderall to target ADHD.  Discussed potential side effects, which includes but not limited to tachycardia, anxiety especially with concomitant use of bupropion.   # MDD, recurrent in full remission He denies significant mood symptoms since the last visit.  We will continue sertraline to target depression.  We will continue bupropion as adjunctive treatment for depression.  We will continue clonazepam at this time for anxiety; he is willing to taper this medication slowly once his situation/relocation gets settled.  Discussed risk of dependence and oversedation.   Plan I have reviewed and updated plans as below 1.ContinueAdderall XR 40 mg daily  2.Continue sertraline 150 mg daily 3. ContinueBupropion450 mg daily (150 mg + 300 mg ) 4.Continue clonazepam 2 mg at night as needed for anxiety  5.Next appointment:6/8 at 4 PM for 20 mins, video  Past trials of medication:sertraline, Effexor (had "zaps"), Wellbutrin, Adderall, Adderall XR, Vyvanse,Concerta,Strattera,   The patient demonstrates the following risk factors for suicide: Chronic risk factors for suicide include:psychiatric disorder ofdepression. Acute risk factorsfor suicide include: N/A. Protective factorsfor this patient include: positive social support, coping skills and hope for the future. Considering these factors, the overall suicide risk at this point appears to below. Patientisappropriate for outpatient follow up.  Norman Clay, MD 07/01/2019, 4:11 PM

## 2019-07-01 ENCOUNTER — Ambulatory Visit (HOSPITAL_COMMUNITY): Payer: 59 | Admitting: Psychiatry

## 2019-07-01 ENCOUNTER — Ambulatory Visit (INDEPENDENT_AMBULATORY_CARE_PROVIDER_SITE_OTHER): Payer: 59 | Admitting: Psychiatry

## 2019-07-01 ENCOUNTER — Other Ambulatory Visit: Payer: Self-pay

## 2019-07-01 ENCOUNTER — Encounter (HOSPITAL_COMMUNITY): Payer: Self-pay | Admitting: Psychiatry

## 2019-07-01 DIAGNOSIS — F3342 Major depressive disorder, recurrent, in full remission: Secondary | ICD-10-CM

## 2019-07-01 DIAGNOSIS — F909 Attention-deficit hyperactivity disorder, unspecified type: Secondary | ICD-10-CM | POA: Diagnosis not present

## 2019-07-01 MED ORDER — SERTRALINE HCL 100 MG PO TABS: 150.0000 mg | ORAL_TABLET | Freq: Every day | ORAL | 0 refills | Status: DC

## 2019-07-01 MED ORDER — AMPHETAMINE-DEXTROAMPHET ER 20 MG PO CP24: ORAL_CAPSULE | ORAL | 0 refills | Status: DC

## 2019-07-01 MED ORDER — BUPROPION HCL ER (XL) 300 MG PO TB24: ORAL_TABLET | ORAL | 0 refills | Status: DC

## 2019-07-01 MED ORDER — BUPROPION HCL ER (XL) 150 MG PO TB24: ORAL_TABLET | ORAL | 0 refills | Status: DC

## 2019-07-01 MED ORDER — CLONAZEPAM 2 MG PO TABS: 2.0000 mg | ORAL_TABLET | Freq: Every day | ORAL | 2 refills | Status: DC

## 2019-07-01 NOTE — Patient Instructions (Signed)
1.ContinueAdderall XR 40 mg daily  2.Continue sertraline 150 mg daily 3. ContinueBupropion450 mg daily (150 mg + 300 mg ) 4.Continue clonazepam 2 mg at night as needed for anxiety  5.Next appointment:6/8 at 4 PM

## 2019-09-27 NOTE — Progress Notes (Signed)
Virtual Visit via Video Note  I connected with Jon Beck on 09/28/19 at  4:00 PM EDT by a video enabled telemedicine application and verified that I am speaking with the correct person using two identifiers.   I discussed the limitations of evaluation and management by telemedicine and the availability of in person appointments. The patient expressed understanding and agreed to proceed.     I discussed the assessment and treatment plan with the patient. The patient was provided an opportunity to ask questions and all were answered. The patient agreed with the plan and demonstrated an understanding of the instructions.   The patient was advised to call back or seek an in-person evaluation if the symptoms worsen or if the condition fails to improve as anticipated.   Location: patient- home yard, provider- home office   I provided 15 minutes of non-face-to-face time during this encounter.   Norman Clay, MD    [facility] MD/PA/NP OP Progress Note  09/28/2019 4:33 PM Jon Beck  MRN:  [id number]  Chief Complaint:  Chief Complaint    Follow-up; Depression; Anxiety; ADHD     HPI:  This is a follow-up appointment for depression and ADHD.  He states that he has been doing very well.  He enjoys spending time with his parents.  He has been very busy at work; he slept only 7 hours since last Friday.  He has been handling things well.  He is able to feel rested when he does not work.  He enjoys working on yard, and taking a walk.  Although he was eating unhealthy food while he was in the process of relocation, he is eating healthier food.  He denies feeling depressed.  He has good energy and motivation.  He denies SI.  He denies anxiety or panic attacks. He has good concentration. He is able to do multi tasking. He denies any issues with attention.  He denies irritability.  He is willing to try tapering down clonazepam.   246 lbs Wt Readings from Last 3 Encounters:  06/04/18 272 lb (123.4  kg)  02/17/18 260 lb (117.9 kg)  12/16/17 247 lb (112 kg)    Visit Diagnosis:    ICD-10-CM   1. Attention deficit hyperactivity disorder (ADHD), unspecified ADHD type  F90.9 amphetamine-dextroamphetamine (ADDERALL XR) 20 MG 24 hr capsule    amphetamine-dextroamphetamine (ADDERALL XR) 20 MG 24 hr capsule    amphetamine-dextroamphetamine (ADDERALL XR) 20 MG 24 hr capsule  2. MDD (major depressive disorder), recurrent, in full remission ([place])  F33.42 sertraline (ZOLOFT) 100 MG tablet    buPROPion (WELLBUTRIN XL) 300 MG 24 hr tablet    buPROPion (WELLBUTRIN XL) 150 MG 24 hr tablet    Past Psychiatric History: Please see initial evaluation for full details. I have reviewed the history. No updates at this time.     Past Medical History:  Past Medical History:  Diagnosis Date   Hypertension    No past surgical history on file.  Family Psychiatric History: Please see initial evaluation for full details. I have reviewed the history. No updates at this time.     Family History: No family history on file.  Social History:  Social History   Socioeconomic History   Marital status: Single    Spouse name: Not on file   Number of children: Not on file   Years of education: Not on file   Highest education level: Not on file  Occupational History   Not on file  Tobacco Use  Smoking status: Never Smoker   Smokeless tobacco: Never Used  Substance and Sexual Activity   Alcohol use: Yes    Comment: Socail    Drug use: No   Sexual activity: Not on file  Other Topics Concern   Not on file  Social History Narrative   Not on file   Social Determinants of Health   Financial Resource Strain:    Difficulty of Paying Living Expenses:   Food Insecurity:    Worried About Programme researcher, broadcasting/film/video in the Last Year:    Barista in the Last Year:   Transportation Needs:    Freight forwarder (Medical):    Lack of Transportation (Non-Medical):   Physical Activity:     Days of Exercise per Week:    Minutes of Exercise per Session:   Stress:    Feeling of Stress :   Social Connections:    Frequency of Communication with Friends and Family:    Frequency of Social Gatherings with Friends and Family:    Attends Religious Services:    Active Member of Clubs or Organizations:    Attends Engineer, structural:    Marital Status:     Allergies: No Known Allergies  Metabolic Disorder Labs: No results found for: HGBA1C, MPG No results found for: PROLACTIN No results found for: CHOL, TRIG, HDL, CHOLHDL, VLDL, LDLCALC No results found for: TSH  Therapeutic Level Labs: No results found for: LITHIUM No results found for: VALPROATE No components found for:  CBMZ  Current Medications: Current Outpatient Medications  Medication Sig Dispense Refill   [START ON 11/27/2019] amphetamine-dextroamphetamine (ADDERALL XR) 20 MG 24 hr capsule Take 2 capsules in the morning 60 capsule 0   [START ON 10/27/2019] amphetamine-dextroamphetamine (ADDERALL XR) 20 MG 24 hr capsule Take 2 capsules in the morning 60 capsule 0   amphetamine-dextroamphetamine (ADDERALL XR) 20 MG 24 hr capsule Take 2 capsules in the morning 60 capsule 0   buPROPion (WELLBUTRIN XL) 150 MG 24 hr tablet Take total of 450 mg daily (300 mg + 150 mg ) 90 tablet 0   buPROPion (WELLBUTRIN XL) 300 MG 24 hr tablet Take total of 450 mg daily (300 mg + 150 mg ) 90 tablet 0   clonazePAM (KLONOPIN) 2 MG tablet Take 1 tablet (2 mg total) by mouth at bedtime. 30 tablet 2   ibuprofen (ADVIL,MOTRIN) 200 MG tablet Take 200 mg by mouth 3 (three) times daily as needed.     levothyroxine (SYNTHROID, LEVOTHROID) 75 MCG tablet Take 75 mcg by mouth daily before breakfast. 1.5 tabs on weekends     nebivolol (BYSTOLIC) 10 MG tablet Take 10 mg by mouth daily.     sertraline (ZOLOFT) 100 MG tablet Take 1.5 tablets (150 mg total) by mouth at bedtime. 135 tablet 0   No current facility-administered  medications for this visit.     Musculoskeletal: Strength & Muscle Tone: N/A Gait & Station: N/A Patient leans: N/A  Psychiatric Specialty Exam: Review of Systems  Psychiatric/Behavioral: Negative for agitation, behavioral problems, confusion, decreased concentration, dysphoric mood, hallucinations, self-injury, sleep disturbance and suicidal ideas. The patient is not nervous/anxious and is not hyperactive.   All other systems reviewed and are negative.   There were no vitals taken for this visit.There is no height or weight on file to calculate BMI.  General Appearance: Fairly Groomed  Eye Contact:  Good  Speech:  Clear and Coherent  Volume:  Normal  Mood:  good  Affect:  Appropriate, Congruent and euthymic  Thought Process:  Coherent  Orientation:  Full (Time, Place, and Person)  Thought Content: Logical   Suicidal Thoughts:  No  Homicidal Thoughts:  No  Memory:  Immediate;   Good  Judgement:  Good  Insight:  Good  Psychomotor Activity:  Normal  Concentration:  Concentration: Good and Attention Span: Good  Recall:  Good  Fund of Knowledge: Good  Language: Good  Akathisia:  No  Handed:  Right  AIMS (if indicated): not done  Assets:  Communication Skills Desire for Improvement  ADL's:  Intact  Cognition: WNL  Sleep:  Good   Screenings:   Assessment and Plan:  Broedy Osbourne is a 39 y.o. year old male with a history of depression,  ADHD< hypertension,  hyperlipidemia,hypothyroidism, obesity , who presents for follow up appointment for below.   1. MDD (major depressive disorder), recurrent, in full remission (HCC) He denies significant mood symptoms since the last visit.  Provided psychoeducation of maintenance therapy to avoid relapse in his mood symptoms.  Will continue sertraline to target depression.  Will continue bupropion as adjunctive treatment for depression.  Will taper down clonazepam given he denies significant anxiety.  He will contact the office if he  has any worsening in anxiety while tapering down this medication.  Discussed risk of dependence and oversedation.   2. Attention deficit hyperactivity disorder (ADHD), unspecified ADHD type He reports improvement in ADHD symptoms since the last visit.  Noted that he had 3 psychological testing in the past, first diagnosed at age 64 according to the patient.  Will continue current dose of Adderall to target ADHD.  He is aware of the risk which includes but not limited to tachycardia, anxiety, especially with concomitant use of bupropion.   Plan I have reviewed and updated plans as below 1.ContinueAdderall XR 40 mg daily 2.Continue sertraline 150 mg daily 3. ContinueBupropion450 mg daily (150 mg + 300 mg ) 4.Decrease clonazepam 1 mg at night as needed for anxiety  - he will contact the office to notify whether he is able to keep this dose  5.Next appointment:9/3 at 8:50 for 20 mins, video  Past trials of medication:sertraline, Effexor (had "zaps"), Wellbutrin, Adderall, Adderall XR, Vyvanse,Concerta,Strattera,   The patient demonstrates the following risk factors for suicide: Chronic risk factors for suicide include:psychiatric disorder ofdepression. Acute risk factorsfor suicide include: N/A. Protective factorsfor this patient include: positive social support, coping skills and hope for the future. Considering these factors, the overall suicide risk at this point appears to below. Patientisappropriate for outpatient follow up.   [name], MD 09/28/2019, 4:33 PM

## 2019-09-28 ENCOUNTER — Telehealth (INDEPENDENT_AMBULATORY_CARE_PROVIDER_SITE_OTHER): Payer: No Typology Code available for payment source | Admitting: Psychiatry

## 2019-09-28 ENCOUNTER — Other Ambulatory Visit: Payer: Self-pay

## 2019-09-28 ENCOUNTER — Encounter (HOSPITAL_COMMUNITY): Payer: Self-pay | Admitting: Psychiatry

## 2019-09-28 DIAGNOSIS — F3342 Major depressive disorder, recurrent, in full remission: Secondary | ICD-10-CM

## 2019-09-28 DIAGNOSIS — F909 Attention-deficit hyperactivity disorder, unspecified type: Secondary | ICD-10-CM

## 2019-09-28 MED ORDER — BUPROPION HCL ER (XL) 150 MG PO TB24: ORAL_TABLET | ORAL | 0 refills | Status: DC

## 2019-09-28 MED ORDER — AMPHETAMINE-DEXTROAMPHET ER 20 MG PO CP24: ORAL_CAPSULE | ORAL | 0 refills | Status: DC

## 2019-09-28 MED ORDER — SERTRALINE HCL 100 MG PO TABS: 150.0000 mg | ORAL_TABLET | Freq: Every day | ORAL | 0 refills | Status: DC

## 2019-09-28 MED ORDER — BUPROPION HCL ER (XL) 300 MG PO TB24: ORAL_TABLET | ORAL | 0 refills | Status: DC

## 2019-09-28 NOTE — Patient Instructions (Signed)
1.ContinueAdderall XR 40 mg daily 2.Continue sertraline 150 mg daily 3. ContinueBupropion450 mg daily (150 mg + 300 mg ) 4.Decrease clonazepam 1 mg at night as needed for anxiety 5.Next appointment:9/3 at 8:50

## 2019-10-14 ENCOUNTER — Telehealth (HOSPITAL_COMMUNITY): Payer: Self-pay | Admitting: *Deleted

## 2019-10-14 ENCOUNTER — Other Ambulatory Visit (HOSPITAL_COMMUNITY): Payer: Self-pay | Admitting: Psychiatry

## 2019-10-14 DIAGNOSIS — F3342 Major depressive disorder, recurrent, in full remission: Secondary | ICD-10-CM

## 2019-10-14 MED ORDER — CLONAZEPAM 1 MG PO TABS: 1.0000 mg | ORAL_TABLET | Freq: Every day | ORAL | 2 refills | Status: DC

## 2019-10-14 NOTE — Telephone Encounter (Signed)
Patient called stating he is ready to do the 1 mg Klnonpin and was told to call office to get the refills sent in during his previous visit with provider.

## 2019-10-14 NOTE — Telephone Encounter (Signed)
Informed patient with information  

## 2019-10-14 NOTE — Telephone Encounter (Signed)
Ordered

## 2019-10-21 ENCOUNTER — Telehealth (HOSPITAL_COMMUNITY): Payer: Self-pay | Admitting: *Deleted

## 2019-10-21 NOTE — Telephone Encounter (Signed)
He states that he is not having good mental stability. He was out for clonazepam for a day or two due to issues getting this medication at the pharmacy. Although he was able to start taking 1 mg, he notices that he feels more irritable and anxious lately. He is not aware of any change other than change in clonazepam. He had to take a day off from work today. He denies SI. He agrees to do the following.  - Increase clonazepam 1.5 mg daily. Increase up to 2 mg next week if no improvement in his symptoms.  - Contact office for refill.

## 2019-10-21 NOTE — Telephone Encounter (Signed)
Klonopin, per pt the first 2 days have been good and now later in the week no sleep having a mental withdraw and feels on the edge. Nothing has change, been having crazy dreams and feels very much on edge and can feel himself getting irritable. Per pt he would like to know if provider can talk with him because it's just hard to explain. [phone].

## 2019-10-22 NOTE — Telephone Encounter (Signed)
noted 

## 2019-11-03 ENCOUNTER — Telehealth (HOSPITAL_COMMUNITY): Payer: Self-pay

## 2019-11-03 ENCOUNTER — Other Ambulatory Visit (HOSPITAL_COMMUNITY): Payer: Self-pay | Admitting: Psychiatry

## 2019-11-03 DIAGNOSIS — F3342 Major depressive disorder, recurrent, in full remission: Secondary | ICD-10-CM

## 2019-11-03 MED ORDER — CLONAZEPAM 2 MG PO TABS: 2.0000 mg | ORAL_TABLET | Freq: Every day | ORAL | 1 refills | Status: DC

## 2019-11-03 NOTE — Telephone Encounter (Signed)
Medication refill - Patient left a message he is in need of a new Clonazepam 2 mg order as that is what he was increased to and is now running out.  Patient returns 12/24/19 next.

## 2019-11-03 NOTE — Telephone Encounter (Signed)
Ordered

## 2019-11-03 NOTE — Telephone Encounter (Signed)
Medication management - Telephone call with patient to inform new order was sent into his pharmacy as requested.  Patient to call back if any problems filling new order of Clonazepam 2mg .

## 2019-12-17 NOTE — Progress Notes (Deleted)
BH MD/PA/NP OP Progress Note  12/17/2019 11:29 AM Jon Beck  MRN:  [id number]  Chief Complaint:  HPI: *** Visit Diagnosis: No diagnosis found.  Past Psychiatric History: Please see initial evaluation for full details. I have reviewed the history. No updates at this time.     Past Medical History:  Past Medical History:  Diagnosis Date  . Hypertension    No past surgical history on file.  Family Psychiatric History: Please see initial evaluation for full details. I have reviewed the history. No updates at this time.     Family History: No family history on file.  Social History:  Social History   Socioeconomic History  . Marital status: Single    Spouse name: Not on file  . Number of children: Not on file  . Years of education: Not on file  . Highest education level: Not on file  Occupational History  . Not on file  Tobacco Use  . Smoking status: Never Smoker  . Smokeless tobacco: Never Used  Substance and Sexual Activity  . Alcohol use: Yes    Comment: Socail   . Drug use: No  . Sexual activity: Not on file  Other Topics Concern  . Not on file  Social History Narrative  . Not on file   Social Determinants of Health   Financial Resource Strain:   . Difficulty of Paying Living Expenses: Not on file  Food Insecurity:   . Worried About Programme researcher, broadcasting/film/video in the Last Year: Not on file  . Ran Out of Food in the Last Year: Not on file  Transportation Needs:   . Lack of Transportation (Medical): Not on file  . Lack of Transportation (Non-Medical): Not on file  Physical Activity:   . Days of Exercise per Week: Not on file  . Minutes of Exercise per Session: Not on file  Stress:   . Feeling of Stress : Not on file  Social Connections:   . Frequency of Communication with Friends and Family: Not on file  . Frequency of Social Gatherings with Friends and Family: Not on file  . Attends Religious Services: Not on file  . Active Member of Clubs or  Organizations: Not on file  . Attends Banker Meetings: Not on file  . Marital Status: Not on file    Allergies: No Known Allergies  Metabolic Disorder Labs: No results found for: HGBA1C, MPG No results found for: PROLACTIN No results found for: CHOL, TRIG, HDL, CHOLHDL, VLDL, LDLCALC No results found for: TSH  Therapeutic Level Labs: No results found for: LITHIUM No results found for: VALPROATE No components found for:  CBMZ  Current Medications: Current Outpatient Medications  Medication Sig Dispense Refill  . amphetamine-dextroamphetamine (ADDERALL XR) 20 MG 24 hr capsule Take 2 capsules in the morning 60 capsule 0  . amphetamine-dextroamphetamine (ADDERALL XR) 20 MG 24 hr capsule Take 2 capsules in the morning 60 capsule 0  . amphetamine-dextroamphetamine (ADDERALL XR) 20 MG 24 hr capsule Take 2 capsules in the morning 60 capsule 0  . buPROPion (WELLBUTRIN XL) 150 MG 24 hr tablet Take total of 450 mg daily (300 mg + 150 mg ) 90 tablet 0  . buPROPion (WELLBUTRIN XL) 300 MG 24 hr tablet Take total of 450 mg daily (300 mg + 150 mg ) 90 tablet 0  . clonazePAM (KLONOPIN) 2 MG tablet Take 1 tablet (2 mg total) by mouth at bedtime. 30 tablet 1  . ibuprofen (ADVIL,MOTRIN) 200 MG  tablet Take 200 mg by mouth 3 (three) times daily as needed.    [name] levothyroxine (SYNTHROID, LEVOTHROID) 75 MCG tablet Take 75 mcg by mouth daily before breakfast. 1.5 tabs on weekends    . nebivolol (BYSTOLIC) 10 MG tablet Take 10 mg by mouth daily.    . sertraline (ZOLOFT) 100 MG tablet Take 1.5 tablets (150 mg total) by mouth at bedtime. 135 tablet 0   No current facility-administered medications for this visit.     Musculoskeletal: Strength & Muscle Tone: N/A Gait & Station: N.A Patient leans: N/A  Psychiatric Specialty Exam: Review of Systems  There were no vitals taken for this visit.There is no height or weight on file to calculate BMI.  General Appearance: {Appearance:22683}  Eye  Contact:  {BHH EYE CONTACT:22684}  Speech:  Clear and Coherent  Volume:  Normal  Mood:  {BHH MOOD:22306}  Affect:  {Affect (PAA):22687}  Thought Process:  Coherent  Orientation:  Full (Time, Place, and Person)  Thought Content: Logical   Suicidal Thoughts:  {ST/HT (PAA):22692}  Homicidal Thoughts:  {ST/HT (PAA):22692}  Memory:  Immediate;   Good  Judgement:  {Judgement (PAA):22694}  Insight:  {Insight (PAA):22695}  Psychomotor Activity:  Normal  Concentration:  Concentration: Good and Attention Span: Good  Recall:  Good  Fund of Knowledge: Good  Language: Good  Akathisia:  No  Handed:  Right  AIMS (if indicated): not done  Assets:  Communication Skills Desire for Improvement  ADL's:  Intact  Cognition: WNL  Sleep:  {BHH GOOD/FAIR/POOR:22877}   Screenings:   Assessment and Plan:  Jon Beck is a 39 y.o. year old male with a history of  depression,  ADHD<hypertension,hyperlipidemia,hypothyroidism, obesity, who presents for follow up appointment for below.     1. MDD (major depressive disorder), recurrent, in full remission (HCC) He denies significant mood symptoms since the last visit.  Provided psychoeducation of maintenance therapy to avoid relapse in his mood symptoms.  Will continue sertraline to target depression.  Will continue bupropion as adjunctive treatment for depression.  Will taper down clonazepam given he denies significant anxiety.  He will contact the office if he has any worsening in anxiety while tapering down this medication.  Discussed risk of dependence and oversedation.   2. Attention deficit hyperactivity disorder (ADHD), unspecified ADHD type He reports improvement in ADHD symptoms since the last visit. Noted that he had 3 psychological testing in the past,first diagnosed at age 21 according to the patient.  Will continue current dose of Adderall to target ADHD.  He is aware of the risk which includes but not limited to tachycardia, anxiety,  especially with concomitant use of bupropion.   Plan  1.ContinueAdderall XR 40 mg daily 2.Continue sertraline 150 mg daily 3. ContinueBupropion450 mg daily (150 mg + 300 mg ) 4.Decrease clonazepam 1 mg at night as needed for anxiety  - he will contact the office to notify whether he is able to keep this dose  5.Next appointment:9/3 at 8:50 for 20 mins, video  Past trials of medication:sertraline, Effexor (had "zaps"), Wellbutrin, Adderall, Adderall XR, Vyvanse,Concerta,Strattera,   The patient demonstrates the following risk factors for suicide: Chronic risk factors for suicide include:psychiatric disorder ofdepression. Acute risk factorsfor suicide include: N/A. Protective factorsfor this patient include: positive social support, coping skills and hope for the future. Considering these factors, the overall suicide risk at this point appears to below. Patientisappropriate for outpatient follow up.  [name], MD 12/17/2019, 11:29 AM

## 2019-12-22 ENCOUNTER — Telehealth: Payer: Self-pay | Admitting: Family

## 2019-12-22 ENCOUNTER — Telehealth: Payer: Self-pay | Admitting: Adult Health

## 2019-12-22 NOTE — Telephone Encounter (Signed)
Called and LMOM regarding monoclonal antibody treatment for COVID 19 given to those who are at risk for complications and/or hospitalization of the virus.  Patient meets criteria based on: BMI greater than 25, HTN  Call back number given: [phone]  My chart message: sent  Armonie Staten, NP  

## 2019-12-22 NOTE — Telephone Encounter (Signed)
Called to Discuss with patient about Covid symptoms and the use of the monoclonal antibody infusion for those with mild to moderate Covid symptoms and at a high risk of hospitalization.     Pt appears to qualify for this infusion due to co-morbid conditions and/or a member of an at-risk group in accordance with the FDA Emergency Use Authorization.   Jon Beck has risk factors including hypertension and BMI >25.    Unable to reach pt - left message with hotline number and MyChart message sent.   Marcos Eke, NP

## 2019-12-23 ENCOUNTER — Encounter: Payer: Self-pay | Admitting: Adult Health

## 2019-12-23 ENCOUNTER — Other Ambulatory Visit: Payer: Self-pay | Admitting: Adult Health

## 2019-12-23 ENCOUNTER — Telehealth: Payer: Self-pay | Admitting: Family

## 2019-12-23 DIAGNOSIS — U071 COVID-19: Secondary | ICD-10-CM

## 2019-12-23 NOTE — Progress Notes (Signed)
I connected by phone with Jon Beck on 12/23/2019 at 11:30 AM to discuss the potential use of a new treatment for mild to moderate COVID-19 viral infection in non-hospitalized patients.  This patient is a 39 y.o. male that meets the FDA criteria for Emergency Use Authorization of COVID monoclonal antibody casirivimab/imdevimab.  Has a (+) direct SARS-CoV-2 viral test result  Has mild or moderate COVID-19   Is NOT hospitalized due to COVID-19  Is within 10 days of symptom onset  Has at least one of the high risk factor(s) for progression to severe COVID-19 and/or hospitalization as defined in EUA.  Specific high risk criteria : BMI > 25 and Cardiovascular disease or hypertension   I have spoken and communicated the following to the patient or parent/caregiver regarding COVID monoclonal antibody treatment:  1. FDA has authorized the emergency use for the treatment of mild to moderate COVID-19 in adults and pediatric patients with positive results of direct SARS-CoV-2 viral testing who are 59 years of age and older weighing at least 40 kg, and who are at high risk for progressing to severe COVID-19 and/or hospitalization.  2. The significant known and potential risks and benefits of COVID monoclonal antibody, and the extent to which such potential risks and benefits are unknown.  3. Information on available alternative treatments and the risks and benefits of those alternatives, including clinical trials.  4. Patients treated with COVID monoclonal antibody should continue to self-isolate and use infection control measures (e.g., wear mask, isolate, social distance, avoid sharing personal items, clean and disinfect "high touch" surfaces, and frequent handwashing) according to CDC guidelines.   5. The patient or parent/caregiver has the option to accept or refuse COVID monoclonal antibody treatment.  After reviewing this information with the patient, The patient agreed to proceed with  receiving casirivimab\imdevimab infusion and will be provided a copy of the Fact sheet prior to receiving the infusion. Noreene Filbert 12/23/2019 11:30 AM

## 2019-12-23 NOTE — Telephone Encounter (Deleted)
.  mabphone

## 2019-12-24 ENCOUNTER — Telehealth (HOSPITAL_COMMUNITY): Payer: No Typology Code available for payment source | Admitting: Psychiatry

## 2019-12-24 ENCOUNTER — Ambulatory Visit (HOSPITAL_COMMUNITY): Payer: Self-pay | Attending: Pulmonary Disease

## 2019-12-28 ENCOUNTER — Other Ambulatory Visit (HOSPITAL_COMMUNITY): Payer: Self-pay | Admitting: Psychiatry

## 2019-12-28 ENCOUNTER — Telehealth: Payer: Self-pay | Admitting: Internal Medicine

## 2019-12-28 ENCOUNTER — Ambulatory Visit (HOSPITAL_COMMUNITY)
Admission: RE | Admit: 2019-12-28 | Discharge: 2019-12-28 | Disposition: A | Discharge status: Home / Self Care | Payer: No Typology Code available for payment source | Source: Ambulatory Visit | Attending: Pulmonary Disease | Admitting: Pulmonary Disease

## 2019-12-28 ENCOUNTER — Other Ambulatory Visit: Payer: Self-pay | Admitting: Internal Medicine

## 2019-12-28 DIAGNOSIS — U071 COVID-19: Secondary | ICD-10-CM | POA: Diagnosis present

## 2019-12-28 DIAGNOSIS — F3342 Major depressive disorder, recurrent, in full remission: Secondary | ICD-10-CM

## 2019-12-28 MED ORDER — SODIUM CHLORIDE 0.9 % IV SOLN
1200.0000 mg | Freq: Once | INTRAVENOUS | Status: AC
  Administered 2019-12-28: 1200 mg via INTRAVENOUS
  Filled 2019-12-28: qty 10

## 2019-12-28 MED ORDER — SERTRALINE HCL 100 MG PO TABS: 150.0000 mg | ORAL_TABLET | Freq: Every day | ORAL | 0 refills | Status: DC

## 2019-12-28 MED ORDER — SODIUM CHLORIDE 0.9 % IV SOLN: INTRAVENOUS | Status: DC | PRN

## 2019-12-28 MED ORDER — BUPROPION HCL ER (XL) 150 MG PO TB24: ORAL_TABLET | ORAL | 0 refills | Status: DC

## 2019-12-28 MED ORDER — FAMOTIDINE IN NACL 20-0.9 MG/50ML-% IV SOLN: 20.0000 mg | Freq: Once | INTRAVENOUS | Status: DC | PRN

## 2019-12-28 MED ORDER — ALBUTEROL SULFATE HFA 108 (90 BASE) MCG/ACT IN AERS: 2.0000 | INHALATION_SPRAY | Freq: Once | RESPIRATORY_TRACT | Status: DC | PRN

## 2019-12-28 MED ORDER — METHYLPREDNISOLONE SODIUM SUCC 125 MG IJ SOLR: 125.0000 mg | Freq: Once | INTRAMUSCULAR | Status: DC | PRN

## 2019-12-28 MED ORDER — EPINEPHRINE 0.3 MG/0.3ML IJ SOAJ: 0.3000 mg | Freq: Once | INTRAMUSCULAR | Status: DC | PRN

## 2019-12-28 MED ORDER — BUPROPION HCL ER (XL) 300 MG PO TB24: ORAL_TABLET | ORAL | 0 refills | Status: DC

## 2019-12-28 MED ORDER — DIPHENHYDRAMINE HCL 50 MG/ML IJ SOLN: 50.0000 mg | Freq: Once | INTRAMUSCULAR | Status: DC | PRN

## 2019-12-28 NOTE — Discharge Instructions (Signed)
10 Things You Can Do to Manage Your COVID-19 Symptoms at Home If you have possible or confirmed COVID-19: 1. Stay home from work and school. And stay away from other public places. If you must go out, avoid using any kind of public transportation, ridesharing, or taxis. 2. Monitor your symptoms carefully. If your symptoms get worse, call your healthcare provider immediately. 3. Get rest and stay hydrated. 4. If you have a medical appointment, call the healthcare provider ahead of time and tell them that you have or may have COVID-19. 5. For medical emergencies, call 911 and notify the dispatch personnel that you have or may have COVID-19. 6. Cover your cough and sneezes with a tissue or use the inside of your elbow. 7. Wash your hands often with soap and water for at least 20 seconds or clean your hands with an alcohol-based hand sanitizer that contains at least 60% alcohol. 8. As much as possible, stay in a specific room and away from other people in your home. Also, you should use a separate bathroom, if available. If you need to be around other people in or outside of the home, wear a mask. 9. Avoid sharing personal items with other people in your household, like dishes, towels, and bedding. 10. Clean all surfaces that are touched often, like counters, tabletops, and doorknobs. Use household cleaning sprays or wipes according to the label instructions. cdc.gov/coronavirus 10/21/2018 This information is not intended to replace advice given to you by your health care provider. Make sure you discuss any questions you have with your health care provider. Document Revised: 03/25/2019 Document Reviewed: 03/25/2019 Elsevier Patient Education  2020 Elsevier Inc.  What types of side effects do monoclonal antibody drugs cause?  Common side effects  In general, the more common side effects caused by monoclonal antibody drugs include: . Allergic reactions, such as hives or itching . Flu-like signs and  symptoms, including chills, fatigue, fever, and muscle aches and pains . Nausea, vomiting . Diarrhea . Skin rashes . Low blood pressure   The CDC is recommending patients who receive monoclonal antibody treatments wait at least 90 days before being vaccinated.  Currently, there are no data on the safety and efficacy of mRNA COVID-19 vaccines in persons who received monoclonal antibodies or convalescent plasma as part of COVID-19 treatment. Based on the estimated half-life of such therapies as well as evidence suggesting that reinfection is uncommon in the 90 days after initial infection, vaccination should be deferred for at least 90 days, as a precautionary measure until additional information becomes available, to avoid interference of the antibody treatment with vaccine-induced immune responses.  What types of side effects do monoclonal antibody drugs cause?  Common side effects  In general, the more common side effects caused by monoclonal antibody drugs include: . Allergic reactions, such as hives or itching . Flu-like signs and symptoms, including chills, fatigue, fever, and muscle aches and pains . Nausea, vomiting . Diarrhea . Skin rashes . Low blood pressure   The CDC is recommending patients who receive monoclonal antibody treatments wait at least 90 days before being vaccinated.  Currently, there are no data on the safety and efficacy of mRNA COVID-19 vaccines in persons who received monoclonal antibodies or convalescent plasma as part of COVID-19 treatment. Based on the estimated half-life of such therapies as well as evidence suggesting that reinfection is uncommon in the 90 days after initial infection, vaccination should be deferred for at least 90 days, as a precautionary measure until   additional information becomes available, to avoid interference of the antibody treatment with vaccine-induced immune responses. 

## 2019-12-28 NOTE — Progress Notes (Signed)
  Diagnosis: COVID-19  Physician: Dr. Patrick Wright  Procedure: Covid Infusion Clinic Med: casirivimab\imdevimab infusion - Provided patient with casirivimab\imdevimab fact sheet for patients, parents and caregivers prior to infusion.  Complications: No immediate complications noted.  Discharge: Discharged home   Matas Burrows 12/28/2019  

## 2019-12-28 NOTE — Telephone Encounter (Signed)
Called to Discuss with patient about Covid symptoms and the use of the monoclonal antibody infusion for those with mild to moderate Covid symptoms and at a high risk of hospitalization.     Patient previously scheduled for the Mab infusion and canceled appointment as he was feeling better at that time.  Patient reached back out to the infusion clinic today as his symptoms persist and wishes to proceed with antibody infusion.  Appointment scheduled for 9/7.  Marcy Salvo, NP [facility]

## 2019-12-30 NOTE — Progress Notes (Signed)
Virtual Visit via Video Note  I connected with Jon Beck on 01/06/20 at  3:20 PM EDT by a video enabled telemedicine application and verified that I am speaking with the correct person using two identifiers.   I discussed the limitations of evaluation and management by telemedicine and the availability of in person appointments. The patient expressed understanding and agreed to proceed.   I discussed the assessment and treatment plan with the patient. The patient was provided an opportunity to ask questions and all were answered. The patient agreed with the plan and demonstrated an understanding of the instructions.   The patient was advised to call back or seek an in-person evaluation if the symptoms worsen or if the condition fails to improve as anticipated.  Location: patient- home, provider- home office   I provided 12 minutes of non-face-to-face time during this encounter.   [name], MD    [facility] MD/PA/NP OP Progress Note  01/06/2020 3:41 PM Jon Beck  MRN:  [id number]  Chief Complaint:  Chief Complaint    Follow-up; ADHD; Anxiety; Depression     HPI:  - Clonazepam was uptitrated to 2 mg due to worsening in anxiety  This is a follow-up appointment for depression and ADHD.  He states that his anxiety was of the charts after he tried lower dose of Klonopin for a few days.  He felt better the next day after he took 2 mg.  He also states that he has been feeling very stressed as there has been many turnover at work. He has a Writer, and he wants to try lowering the dose of clonazepam again after he is getting used to this new environment.  He reports good relationship with his fiance.  They have settled in to the new house.  His parents are visiting him, and they will stay there this weekend.  He sleeps well.  He denies feeling depressed.  He has good appetite.  He has good energy.  He denies SI.  He denies panic attacks.  He denies any concerns about his  concentration.  He is able to do multitasking, and denies forgetfulness.  He drinks beers occasionally.  He denies drug use.   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 Attention deficit hyperactivity disorder (ADHD), unspecified ADHD type  F90.9   2. MDD (major depressive disorder), recurrent, in full remission (HCC)  F33.42 clonazePAM (KLONOPIN) 2 MG tablet    Past Psychiatric History: Please see initial evaluation for full details. I have reviewed the history. No updates at this time.     Past Medical History:  Past Medical History:  Diagnosis Date  . Hypertension    No past surgical history on file.  Family Psychiatric History: Please see initial evaluation for full details. I have reviewed the history. No updates at this time.     Family History: No family history on file.  Social History:  Social History   Socioeconomic History  . Marital status: Single    Spouse name: Not on file  . Number of children: Not on file  . Years of education: Not on file  . Highest education level: Not on file  Occupational History  . Not on file  Tobacco Use  . Smoking status: Never Smoker  . Smokeless tobacco: Never Used  Substance and Sexual Activity  . Alcohol use: Yes  Comment: Socail   . Drug use: No  . Sexual activity: Not on file  Other Topics Concern  . Not on file  Social History Narrative  . Not on file   Social Determinants of Health   Financial Resource Strain:   . Difficulty of Paying Living Expenses: Not on file  Food Insecurity:   . Worried About Programme researcher, broadcasting/film/video in the Last Year: Not on file  . Ran Out of Food in the Last Year: Not on file  Transportation Needs:   . Lack of Transportation (Medical): Not on file  . Lack of Transportation (Non-Medical): Not on file  Physical Activity:   . Days of Exercise per  Week: Not on file  . Minutes of Exercise per Session: Not on file  Stress:   . Feeling of Stress : Not on file  Social Connections:   . Frequency of Communication with Friends and Family: Not on file  . Frequency of Social Gatherings with Friends and Family: Not on file  . Attends Religious Services: Not on file  . Active Member of Clubs or Organizations: Not on file  . Attends Banker Meetings: Not on file  . Marital Status: Not on file    Allergies: No Known Allergies  Metabolic Disorder Labs: No results found for: HGBA1C, MPG No results found for: PROLACTIN No results found for: CHOL, TRIG, HDL, CHOLHDL, VLDL, LDLCALC No results found for: TSH  Therapeutic Level Labs: No results found for: LITHIUM No results found for: VALPROATE No components found for:  CBMZ  Current Medications: Current Outpatient Medications  Medication Sig Dispense Refill  . amphetamine-dextroamphetamine (ADDERALL XR) 20 MG 24 hr capsule Take 1 capsule (20 mg total) by mouth every morning. 30 capsule 0  . buPROPion (WELLBUTRIN XL) 150 MG 24 hr tablet Take total of 450 mg daily (300 mg + 150 mg ) 90 tablet 0  . buPROPion (WELLBUTRIN XL) 300 MG 24 hr tablet Take total of 450 mg daily (300 mg + 150 mg ) 90 tablet 0  . [START ON 02/03/2020] clonazePAM (KLONOPIN) 2 MG tablet Take 1 tablet (2 mg total) by mouth at bedtime. 30 tablet 0  . ibuprofen (ADVIL,MOTRIN) 200 MG tablet Take 200 mg by mouth 3 (three) times daily as needed.    [name] levothyroxine (SYNTHROID, LEVOTHROID) 75 MCG tablet Take 75 mcg by mouth daily before breakfast. 1.5 tabs on weekends    . nebivolol (BYSTOLIC) 10 MG tablet Take 10 mg by mouth daily.    . sertraline (ZOLOFT) 100 MG tablet Take 1.5 tablets (150 mg total) by mouth at bedtime. 135 tablet 0   No current facility-administered medications for this visit.     Musculoskeletal: Strength & Muscle Tone: N/A Gait & Station: N/A Patient leans: N/A  Psychiatric Specialty  Exam: Review of Systems  Psychiatric/Behavioral: Negative for agitation, behavioral problems, confusion, decreased concentration, dysphoric mood, hallucinations, self-injury, sleep disturbance and suicidal ideas. The patient is not nervous/anxious and is not hyperactive.   All other systems reviewed and are negative.   There were no vitals taken for this visit.There is no height or weight on file to calculate BMI.  General Appearance: Fairly Groomed  Eye Contact:  Good  Speech:  Clear and Coherent  Volume:  Normal  Mood:  good  Affect:  Appropriate, Congruent and euthymic  Thought Process:  Coherent  Orientation:  Full (Time, Place, and Person)  Thought Content: Logical   Suicidal Thoughts:  No  Homicidal Thoughts:  No  Memory:  Immediate;   Good  Judgement:  Good  Insight:  Good  Psychomotor Activity:  Normal  Concentration:  Concentration: Good and Attention Span: Good  Recall:  Good  Fund of Knowledge: Good  Language: Good  Akathisia:  No  Handed:  Right  AIMS (if indicated): not done  Assets:  Communication Skills Desire for Improvement  ADL's:  Intact  Cognition: WNL  Sleep:  Good   Screenings:   Assessment and Plan:  Jon Beck is a 39 y.o. year old male with a history of depression,  ADHD<hypertension,hyperlipidemia,hypothyroidism, obesity, who presents for follow up appointment for below.   1. MDD (major depressive disorder), recurrent, in full remission (HCC) He had relapse in significant anxiety in the context of tapering down clonazepam, which also coincided with change in work environment.  He denies any mood symptoms since the dose was uptitrated to the original dose.  Will continue sertraline to target depression.  Will continue bupropion adjunctive treatment for depression.  He is willing to try tapering down clonazepam again after the work environment is stabilized.   2. Attention deficit hyperactivity disorder (ADHD), unspecified ADHD type He had 3  psychological testing in the past,first diagnosed at age 46 according to the patient. He has had a good response to Adderall.  Will continue current dose to target ADHD.  He is aware of its potential risks, which includes but not limited to tachycardia, anxiety, especially with concomitant use of bupropion.   Plan I have reviewed and updated plans as below 1.ContinueAdderall XR 40 mg daily 2.Continue sertraline 150 mg daily 3. ContinueBupropion450 mg daily (150 mg + 300 mg ) 4.Decrease clonazepam 1 mg at night as needed for anxiety  - he is advised to uptitrate to 2 mg if any worsening in his mood symptoms.  5.Next appointment:10/28 at 1:20 for 20 mins, video  I have utilized the  Controlled Substances Reporting System (PMP AWARxE) to confirm adherence regarding the patient's medication. My review reveals appropriate prescription fills.   Past trials of medication:sertraline, Effexor (had "zaps"), Wellbutrin, Adderall, Adderall XR, Vyvanse,Concerta,Strattera,   The patient demonstrates the following risk factors for suicide: Chronic risk factors for suicide include:psychiatric disorder ofdepression. Acute risk factorsfor suicide include: N/A. Protective factorsfor this patient include: positive social support, coping skills and hope for the future. Considering these factors, the overall suicide risk at this point appears to below. Patientisappropriate for outpatient follow up.   [name], MD 01/06/2020, 3:41 PM

## 2019-12-31 NOTE — Telephone Encounter (Signed)
Error

## 2020-01-04 ENCOUNTER — Telehealth (HOSPITAL_COMMUNITY): Payer: Self-pay | Admitting: *Deleted

## 2020-01-04 ENCOUNTER — Other Ambulatory Visit (HOSPITAL_COMMUNITY): Payer: Self-pay | Admitting: Psychiatry

## 2020-01-04 DIAGNOSIS — F3342 Major depressive disorder, recurrent, in full remission: Secondary | ICD-10-CM

## 2020-01-04 MED ORDER — CLONAZEPAM 2 MG PO TABS: 2.0000 mg | ORAL_TABLET | Freq: Every day | ORAL | 0 refills | Status: DC

## 2020-01-04 NOTE — Telephone Encounter (Signed)
Ordered.   I have utilized the [place] Controlled Substances Reporting System (PMP AWARxE) to confirm adherence regarding the patient's medication. My review reveals appropriate prescription fills.

## 2020-01-04 NOTE — Telephone Encounter (Signed)
Patient called stating he is out of his Klonopin. Patient appt is 01-06-2020

## 2020-01-06 ENCOUNTER — Other Ambulatory Visit: Payer: Self-pay

## 2020-01-06 ENCOUNTER — Telehealth (INDEPENDENT_AMBULATORY_CARE_PROVIDER_SITE_OTHER): Payer: No Typology Code available for payment source | Admitting: Psychiatry

## 2020-01-06 ENCOUNTER — Encounter (HOSPITAL_COMMUNITY): Payer: Self-pay | Admitting: Psychiatry

## 2020-01-06 DIAGNOSIS — F909 Attention-deficit hyperactivity disorder, unspecified type: Secondary | ICD-10-CM | POA: Diagnosis not present

## 2020-01-06 DIAGNOSIS — F3342 Major depressive disorder, recurrent, in full remission: Secondary | ICD-10-CM | POA: Diagnosis not present

## 2020-01-06 MED ORDER — CLONAZEPAM 2 MG PO TABS: 2.0000 mg | ORAL_TABLET | Freq: Every day | ORAL | 0 refills | Status: DC

## 2020-01-06 MED ORDER — AMPHETAMINE-DEXTROAMPHET ER 20 MG PO CP24: 20.0000 mg | ORAL_CAPSULE | ORAL | 0 refills | Status: DC

## 2020-01-06 NOTE — Patient Instructions (Signed)
1.ContinueAdderall XR 40 mg daily 2.Continue sertraline 150 mg daily 3. ContinueBupropion450 mg daily (150 mg + 300 mg ) 4.Decrease clonazepam 1 mg at night as needed for anxiety  5.Next appointment:10/28 at 1:20

## 2020-01-28 ENCOUNTER — Other Ambulatory Visit (HOSPITAL_COMMUNITY): Payer: Self-pay | Admitting: Psychiatry

## 2020-01-28 ENCOUNTER — Telehealth (HOSPITAL_COMMUNITY): Payer: Self-pay | Admitting: *Deleted

## 2020-01-28 MED ORDER — AMPHETAMINE-DEXTROAMPHET ER 20 MG PO CP24: 40.0000 mg | ORAL_CAPSULE | ORAL | 0 refills | Status: DC

## 2020-01-28 NOTE — Telephone Encounter (Signed)
Patient called stating that provider sent the wrong script in for him and it was on 01-06-2020. Per pt provider sent in 20 mg of the Adderall but he usually takes BID but only QD was sent in. Per pt he had some left over from last month and he was just late in picking it up. Per pt he just picked up 2 days ago and just open it today.

## 2020-01-28 NOTE — Telephone Encounter (Signed)
I apologize for an error. Correct order is sent so that he can pick it up.   I have utilized the [place] Controlled Substances Reporting System (PMP AWARxE) to confirm adherence regarding the patient's medication. My review reveals appropriate prescription fills.

## 2020-01-31 ENCOUNTER — Encounter (HOSPITAL_COMMUNITY): Payer: Self-pay | Admitting: *Deleted

## 2020-01-31 NOTE — Telephone Encounter (Signed)
Called patient and LMOM

## 2020-01-31 NOTE — Telephone Encounter (Signed)
Message also sent to patient mychart.

## 2020-02-11 NOTE — Progress Notes (Signed)
Virtual Visit via Video Note  I connected with Jon Beck on 02/17/20 at  1:20 PM EDT by a video enabled telemedicine application and verified that I am speaking with the correct person using two identifiers.  Location: Patient: home Provider: office   I discussed the limitations of evaluation and management by telemedicine and the availability of in person appointments. The patient expressed understanding and agreed to proceed.     I discussed the assessment and treatment plan with the patient. The patient was provided an opportunity to ask questions and all were answered. The patient agreed with the plan and demonstrated an understanding of the instructions.   The patient was advised to call back or seek an in-person evaluation if the symptoms worsen or if the condition fails to improve as anticipated.  I provided 12 minutes of non-face-to-face time during this encounter.   [name], MD    [facility] MD/PA/NP OP Progress Note  02/17/2020 1:37 PM Jon Beck  MRN:  [id number]  Chief Complaint:  Chief Complaint    Depression; ADHD; Follow-up     HPI:  This is a follow-up appointment for depression and ADHD.  He states that he has been very busy.  He had been working night for 2 weeks.  They are usually busy during the season.  He states that he had an interview for another position in the company, and it went well.  He will get the results this week. His parents were in town, and he enjoyed the time with them.  He believes he has been doing good except that he has shortness of breath/fatigue since he suffered from COVID in late September.  He sleeps well.  He has good appetite.  He had weight gain, which she attributed to limited activity due to fatigue.  He has good concentration.  Denies SI.  Denies anxiety.  He hopes to try tapering down clonazepam in December. He is able to focus and sustains attention. He denies forgetfulness. He denies any issues at work.   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 MDD (major depressive disorder), recurrent, in full remission (HCC)  F33.42 sertraline (ZOLOFT) 100 MG tablet    buPROPion (WELLBUTRIN XL) 150 MG 24 hr tablet    buPROPion (WELLBUTRIN XL) 300 MG 24 hr tablet    clonazePAM (KLONOPIN) 2 MG tablet    Past Psychiatric History: Please see initial evaluation for full details. I have reviewed the history. No updates at this time.     Past Medical History:  Past Medical History:  Diagnosis Date  . Hypertension    No past surgical history on file.  Family Psychiatric History: Please see initial evaluation for full details. I have reviewed the history. No updates at this time.     Family History: No family history on file.  Social History:  Social History   Socioeconomic History  . Marital status: Single    Spouse name: Not on file  . Number of children: Not on file  . Years of education: Not on file  . Highest education level: Not on file  Occupational History  . Not on file  Tobacco Use  .  Smoking status: Never Smoker  . Smokeless tobacco: Never Used  Substance and Sexual Activity  . Alcohol use: Yes    Comment: Socail   . Drug use: No  . Sexual activity: Not on file  Other Topics Concern  . Not on file  Social History Narrative  . Not on file   Social Determinants of Health   Financial Resource Strain:   . Difficulty of Paying Living Expenses: Not on file  Food Insecurity:   . Worried About Programme researcher, broadcasting/film/video in the Last Year: Not on file  . Ran Out of Food in the Last Year: Not on file  Transportation Needs:   . Lack of Transportation (Medical): Not on file  . Lack of Transportation  (Non-Medical): Not on file  Physical Activity:   . Days of Exercise per Week: Not on file  . Minutes of Exercise per Session: Not on file  Stress:   . Feeling of Stress : Not on file  Social Connections:   . Frequency of Communication with Friends and Family: Not on file  . Frequency of Social Gatherings with Friends and Family: Not on file  . Attends Religious Services: Not on file  . Active Member of Clubs or Organizations: Not on file  . Attends Banker Meetings: Not on file  . Marital Status: Not on file    Allergies: No Known Allergies  Metabolic Disorder Labs: No results found for: HGBA1C, MPG No results found for: PROLACTIN No results found for: CHOL, TRIG, HDL, CHOLHDL, VLDL, LDLCALC No results found for: TSH  Therapeutic Level Labs: No results found for: LITHIUM No results found for: VALPROATE No components found for:  CBMZ  Current Medications: Current Outpatient Medications  Medication Sig Dispense Refill  . amphetamine-dextroamphetamine (ADDERALL XR) 20 MG 24 hr capsule Take 2 capsules (40 mg total) by mouth every morning. 60 capsule 0  . [START ON 03/14/2020] amphetamine-dextroamphetamine (ADDERALL XR) 20 MG 24 hr capsule Take 2 capsules (40 mg total) by mouth every morning. 60 capsule 0  . [START ON 04/11/2020] amphetamine-dextroamphetamine (ADDERALL XR) 20 MG 24 hr capsule Take 2 capsules (40 mg total) by mouth every morning. 60 capsule 0  . [START ON 03/26/2020] buPROPion (WELLBUTRIN XL) 150 MG 24 hr tablet Take total of 450 mg daily (300 mg + 150 mg ) 90 tablet 0  . [START ON 03/26/2020] buPROPion (WELLBUTRIN XL) 300 MG 24 hr tablet Take total of 450 mg daily (300 mg + 150 mg ) 90 tablet 0  . [START ON 03/03/2020] clonazePAM (KLONOPIN) 2 MG tablet Take 1 tablet (2 mg total) by mouth at bedtime. 30 tablet 2  . ibuprofen (ADVIL,MOTRIN) 200 MG tablet Take 200 mg by mouth 3 (three) times daily as needed.    [name] levothyroxine (SYNTHROID, LEVOTHROID) 75 MCG  tablet Take 75 mcg by mouth daily before breakfast. 1.5 tabs on weekends    . nebivolol (BYSTOLIC) 10 MG tablet Take 10 mg by mouth daily.    [name] ON 03/26/2020] sertraline (ZOLOFT) 100 MG tablet Take 1.5 tablets (150 mg total) by mouth at bedtime. 135 tablet 0   No current facility-administered medications for this visit.     Musculoskeletal: Strength & Muscle Tone: N/A Gait & Station: N/A Patient leans: N/A  Psychiatric Specialty Exam: Review of Systems  Psychiatric/Behavioral: Negative for agitation, behavioral problems, confusion, decreased concentration, dysphoric mood, hallucinations, self-injury, sleep disturbance and suicidal ideas. The patient is not nervous/anxious and is not hyperactive.  All other systems reviewed and are negative.   There were no vitals taken for this visit.There is no height or weight on file to calculate BMI.  General Appearance: Fairly Groomed  Eye Contact:  Good  Speech:  Clear and Coherent  Volume:  Normal  Mood:  good  Affect:  Appropriate, Congruent and euthymic  Thought Process:  Coherent  Orientation:  Full (Time, Place, and Person)  Thought Content: Logical   Suicidal Thoughts:  No  Homicidal Thoughts:  No  Memory:  Immediate;   Good  Judgement:  Good  Insight:  Good  Psychomotor Activity:  Normal  Concentration:  Concentration: Good and Attention Span: Good  Recall:  Good  Fund of Knowledge: Good  Language: Good  Akathisia:  No  Handed:  Right  AIMS (if indicated): not done  Assets:  Communication Skills Desire for Improvement  ADL's:  Intact  Cognition: WNL  Sleep:  Good   Screenings:   Assessment and Plan:  Jon Beck is a 39 y.o. year old male with a history of depression,ADHD<hypertension,hyperlipidemia,hypothyroidism, obesity,, who presents for follow up appointment for below.    1. MDD (major depressive disorder), recurrent, in full remission (HCC) He had relapse in significant anxiety in the context  of tapering down clonazepam, which also coincided with change in work environment.  He denies any mood symptoms since the dose was uptitrated to the original dose.  Will continue sertraline to target depression.  Will continue bupropion adjunctive treatment for depression.  He is willing to try tapering down clonazepam again after the work environment is stabilized.   2. Attention deficit hyperactivity disorder (ADHD), unspecified ADHD type He had 3 psychological testing in the past,first diagnosed at age 61 according to the patient.He has had a good response to Adderall.  Will continue current dose to target ADHD.  He is aware of its potential risks, which includes but not limited to tachycardia, anxiety, especially with concomitant use of bupropion.   Plan I have reviewed and updated plans as below 1.ContinueAdderall XR 40 mg daily 2.Continue sertraline 150 mg daily 3. ContinueBupropion450 mg daily (150 mg + 300 mg ) 4.Continue clonazepam 2mg  at night as needed for anxiety  5.Next appointment:1/20 at 2:30 for 20 mins, video  I have utilized the [place] Controlled Substances Reporting System (PMP AWARxE) to confirm adherence regarding the patient's medication. My review reveals appropriate prescription fills.   Past trials of medication:sertraline, Effexor (had "zaps"), Wellbutrin, Adderall, Adderall XR, Vyvanse,Concerta,Strattera,   The patient demonstrates the following risk factors for suicide: Chronic risk factors for suicide include:psychiatric disorder ofdepression. Acute risk factorsfor suicide include: N/A. Protective factorsfor this patient include: positive social support, coping skills and hope for the future. Considering these factors, the overall suicide risk at this point appears to below. Patientisappropriate for outpatient follow up.   , MD 02/17/2020, 1:37 PM

## 2020-02-17 ENCOUNTER — Encounter (HOSPITAL_COMMUNITY): Payer: Self-pay | Admitting: Psychiatry

## 2020-02-17 ENCOUNTER — Telehealth (INDEPENDENT_AMBULATORY_CARE_PROVIDER_SITE_OTHER): Payer: No Typology Code available for payment source | Admitting: Psychiatry

## 2020-02-17 ENCOUNTER — Other Ambulatory Visit: Payer: Self-pay

## 2020-02-17 DIAGNOSIS — F3342 Major depressive disorder, recurrent, in full remission: Secondary | ICD-10-CM

## 2020-02-17 MED ORDER — AMPHETAMINE-DEXTROAMPHET ER 20 MG PO CP24: 40.0000 mg | ORAL_CAPSULE | ORAL | 0 refills | Status: DC

## 2020-02-17 MED ORDER — SERTRALINE HCL 100 MG PO TABS: 150.0000 mg | ORAL_TABLET | Freq: Every day | ORAL | 0 refills | Status: DC

## 2020-02-17 MED ORDER — BUPROPION HCL ER (XL) 300 MG PO TB24: ORAL_TABLET | ORAL | 0 refills | Status: DC

## 2020-02-17 MED ORDER — BUPROPION HCL ER (XL) 150 MG PO TB24: ORAL_TABLET | ORAL | 0 refills | Status: DC

## 2020-02-17 MED ORDER — CLONAZEPAM 2 MG PO TABS: 2.0000 mg | ORAL_TABLET | Freq: Every day | ORAL | 2 refills | Status: DC

## 2020-02-17 NOTE — Patient Instructions (Signed)
1.ContinueAdderall XR 40 mg daily 2.Continue sertraline 150 mg daily 3. ContinueBupropion450 mg daily (150 mg + 300 mg ) 4.Continue clonazepam 2mg  at night as needed for anxiety  5.Next appointment:1/20 at 2:30

## 2020-05-08 NOTE — Progress Notes (Deleted)
[facility] MD/PA/NP OP Progress Note  05/08/2020 9:33 AM Jon Beck  MRN:  [id number]  Chief Complaint:  HPI: *** Visit Diagnosis: No diagnosis found.  Past Psychiatric History: Please see initial evaluation for full details. I have reviewed the history. No updates at this time.     Past Medical History:  Past Medical History:  Diagnosis Date  . Hypertension    No past surgical history on file.  Family Psychiatric History: Please see initial evaluation for full details. I have reviewed the history. No updates at this time.     Family History: No family history on file.  Social History:  Social History   Socioeconomic History  . Marital status: Single    Spouse name: Not on file  . Number of children: Not on file  . Years of education: Not on file  . Highest education level: Not on file  Occupational History  . Not on file  Tobacco Use  . Smoking status: Never Smoker  . Smokeless tobacco: Never Used  Substance and Sexual Activity  . Alcohol use: Yes    Comment: Socail   . Drug use: No  . Sexual activity: Not on file  Other Topics Concern  . Not on file  Social History Narrative  . Not on file   Social Determinants of Health   Financial Resource Strain: Not on file  Food Insecurity: Not on file  Transportation Needs: Not on file  Physical Activity: Not on file  Stress: Not on file  Social Connections: Not on file    Allergies: No Known Allergies  Metabolic Disorder Labs: No results found for: HGBA1C, MPG No results found for: PROLACTIN No results found for: CHOL, TRIG, HDL, CHOLHDL, VLDL, LDLCALC No results found for: TSH  Therapeutic Level Labs: No results found for: LITHIUM No results found for: VALPROATE No components found for:  CBMZ  Current Medications: Current Outpatient Medications  Medication Sig Dispense Refill  . amphetamine-dextroamphetamine (ADDERALL XR) 20 MG 24 hr capsule Take 2 capsules (40 mg total) by mouth every morning. 60  capsule 0  . amphetamine-dextroamphetamine (ADDERALL XR) 20 MG 24 hr capsule Take 2 capsules (40 mg total) by mouth every morning. 60 capsule 0  . amphetamine-dextroamphetamine (ADDERALL XR) 20 MG 24 hr capsule Take 2 capsules (40 mg total) by mouth every morning. 60 capsule 0  . buPROPion (WELLBUTRIN XL) 150 MG 24 hr tablet Take total of 450 mg daily (300 mg + 150 mg ) 90 tablet 0  . buPROPion (WELLBUTRIN XL) 300 MG 24 hr tablet Take total of 450 mg daily (300 mg + 150 mg ) 90 tablet 0  . clonazePAM (KLONOPIN) 2 MG tablet Take 1 tablet (2 mg total) by mouth at bedtime. 30 tablet 2  . ibuprofen (ADVIL,MOTRIN) 200 MG tablet Take 200 mg by mouth 3 (three) times daily as needed.    [name] levothyroxine (SYNTHROID, LEVOTHROID) 75 MCG tablet Take 75 mcg by mouth daily before breakfast. 1.5 tabs on weekends    . nebivolol (BYSTOLIC) 10 MG tablet Take 10 mg by mouth daily.    . sertraline (ZOLOFT) 100 MG tablet Take 1.5 tablets (150 mg total) by mouth at bedtime. 135 tablet 0   No current facility-administered medications for this visit.     Musculoskeletal: Strength & Muscle Tone: N/A Gait & Station: N/A Patient leans: N/A  Psychiatric Specialty Exam: Review of Systems  There were no vitals taken for this visit.There is no height or weight on file to calculate  BMI.  General Appearance: {Appearance:22683}  Eye Contact:  {BHH EYE CONTACT:22684}  Speech:  Clear and Coherent  Volume:  Normal  Mood:  {BHH MOOD:22306}  Affect:  {Affect (PAA):22687}  Thought Process:  Coherent  Orientation:  Full (Time, Place, and Person)  Thought Content: Logical   Suicidal Thoughts:  {ST/HT (PAA):22692}  Homicidal Thoughts:  {ST/HT (PAA):22692}  Memory:  Immediate;   Good  Judgement:  {Judgement (PAA):22694}  Insight:  {Insight (PAA):22695}  Psychomotor Activity:  Normal  Concentration:  Concentration: Good and Attention Span: Good  Recall:  Good  Fund of Knowledge: Good  Language: Good  Akathisia:  No   Handed:  Right  AIMS (if indicated): not done  Assets:  Communication Skills Desire for Improvement  ADL's:  Intact  Cognition: WNL  Sleep:  {BHH GOOD/FAIR/POOR:22877}   Screenings:   Assessment and Plan:  Jon Beck is a 40 y.o. year old male with a history of depression,ADHD<hypertension,hyperlipidemia,hypothyroidism, obesity, who presents for follow up appointment for below.    1. MDD (major depressive disorder), recurrent, in full remission (HCC) He hadrelapse in significant anxiety in the context of tapering down clonazepam, which also coincided with change in work environment. He denies any mood symptoms since the dose was uptitrated to the original dose.Will continue sertraline to target depression.Will continue bupropion adjunctive treatment for depression.He is willing to try tapering down clonazepam again after the work environment is stabilized.  2. Attention deficit hyperactivity disorder (ADHD), unspecified ADHD type He had 3 psychological testing in the past,first diagnosed at age 12 according to the patient.He has had a good response to Adderall.Will continue current dose to target ADHD.He is aware of its potential risks,which includes but not limited to tachycardia,anxiety,especially with concomitant use of bupropion.  Plan  1.ContinueAdderall XR 40 mg daily 2.Continue sertraline 150 mg daily 3. ContinueBupropion450 mg daily (150 mg + 300 mg ) 4.Continue clonazepam2mg  at night as needed for anxiety  5.Next appointment:1/20 at 2:23for 20 mins, video   Past trials of medication:sertraline, Effexor (had "zaps"), Wellbutrin, Adderall, Adderall XR, Vyvanse,Concerta,Strattera,   The patient demonstrates the following risk factors for suicide: Chronic risk factors for suicide include:psychiatric disorder ofdepression. Acute risk factorsfor suicide include: N/A. Protective factorsfor this patient include: positive  social support, coping skills and hope for the future. Considering these factors, the overall suicide risk at this point appears to below. Patientisappropriate for outpatient follow up.   [name], MD 05/08/2020, 9:33 AM

## 2020-05-11 ENCOUNTER — Other Ambulatory Visit: Payer: Self-pay

## 2020-05-11 ENCOUNTER — Telehealth: Payer: Self-pay | Admitting: Psychiatry

## 2020-05-11 ENCOUNTER — Telehealth: Payer: No Typology Code available for payment source | Admitting: Psychiatry

## 2020-05-11 NOTE — Telephone Encounter (Signed)
Sent link for video visit through Epic. Patient did not sign in. Called the patient  for appointment scheduled today. The patient did not answer the phone. Left voice message to contact the office.  

## 2020-05-22 ENCOUNTER — Telehealth (HOSPITAL_COMMUNITY): Payer: Self-pay | Admitting: *Deleted

## 2020-05-22 NOTE — Telephone Encounter (Signed)
Patient called office stating he is needing refills for his medications. Staff gived patient provider's new office number and patient verbalized understanding. Pt called office back again stating he needs his medications and when staff provided pt with number again before staff could say anything else, patient hung up on staff.

## 2020-05-22 NOTE — Telephone Encounter (Signed)
Jess- could you contact the patient and ask which medication he needs. Please also advise him to make follow up appointment so that I can prescribe his medication.

## 2020-05-23 ENCOUNTER — Other Ambulatory Visit: Payer: Self-pay

## 2020-05-23 ENCOUNTER — Other Ambulatory Visit: Payer: Self-pay | Admitting: Psychiatry

## 2020-05-23 ENCOUNTER — Telehealth (INDEPENDENT_AMBULATORY_CARE_PROVIDER_SITE_OTHER): Payer: No Typology Code available for payment source | Admitting: Psychiatry

## 2020-05-23 ENCOUNTER — Telehealth: Payer: No Typology Code available for payment source | Admitting: Psychiatry

## 2020-05-23 ENCOUNTER — Encounter: Payer: Self-pay | Admitting: Psychiatry

## 2020-05-23 DIAGNOSIS — F3342 Major depressive disorder, recurrent, in full remission: Secondary | ICD-10-CM

## 2020-05-23 DIAGNOSIS — F909 Attention-deficit hyperactivity disorder, unspecified type: Secondary | ICD-10-CM

## 2020-05-23 MED ORDER — AMPHETAMINE-DEXTROAMPHET ER 20 MG PO CP24: 40.0000 mg | ORAL_CAPSULE | Freq: Every day | ORAL | 0 refills | Status: DC

## 2020-05-23 MED ORDER — BUPROPION HCL ER (XL) 150 MG PO TB24: ORAL_TABLET | ORAL | 0 refills | Status: DC

## 2020-05-23 MED ORDER — CLONAZEPAM 2 MG PO TABS: 2.0000 mg | ORAL_TABLET | Freq: Every day | ORAL | 1 refills | Status: DC

## 2020-05-23 MED ORDER — SERTRALINE HCL 100 MG PO TABS: 150.0000 mg | ORAL_TABLET | Freq: Every day | ORAL | 1 refills | Status: DC

## 2020-05-23 MED ORDER — BUPROPION HCL ER (XL) 300 MG PO TB24: ORAL_TABLET | ORAL | 0 refills | Status: DC

## 2020-05-23 NOTE — Progress Notes (Signed)
Virtual Visit via Video Note  I connected with Melinda Crutch on 05/23/20 at 10:00 AM EST by a video enabled telemedicine application and verified that I am speaking with the correct person using two identifiers.  Location: Patient: home Provider: office   I discussed the limitations of evaluation and management by telemedicine and the availability of in person appointments. The patient expressed understanding and agreed to proceed.    I discussed the assessment and treatment plan with the patient. The patient was provided an opportunity to ask questions and all were answered. The patient agreed with the plan and demonstrated an understanding of the instructions.   The patient was advised to call back or seek an in-person evaluation if the symptoms worsen or if the condition fails to improve as anticipated.  I provided 20 minutes of non-face-to-face time during this encounter.   [name], MD    [facility] MD/PA/NP OP Progress Note  05/23/2020 10:47 AM Socrates Cahoon  MRN:  [id number]  Chief Complaint:  Chief Complaint    Follow-up; ADHD     HPI:  This is a follow-up appointment for ADHD and depression.  He states that he left a message at least a few times over the past few weeks in [place] office.  He has not run out of his medication, stating that he has a stock of Adderall in case he needs it.  He states that he is at work has been going good except he has been very busy.  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  The new supervisor was found out to be a good one, and he has been adjusting well to the transition.  He states that holiday is weird for his family.  He talks about his brother, who has not talked with [place] for many years.  His brother went to rehab facility 12 years ago.  Although he has been sober, his brother has been changing her friends many times, and his brother stopped reaching out to [place] since he had a new girlfriend.  He states  that he paid for his brother's  rehab, and rent the place for his brother.  He feels more sad for his parents and his niece.  He states that he has accepted this, and he believes he did the best he can.  He has lingering symptoms from COVID; shortness of breath and fatigue, although it has been improving.  He is now able to take a walk with his dog.  He sleeps well except last night.  He lost some weights, and he is hoping to lose more.  He has good energy and motivation.  He feels happy.  He denies SI.  He has good concentration.  He is able to stay on task, and complete tasks without any issues.  He rarely drinks alcohol.  He denies drug use.    Employment:customer Merchandiser, retail at [employer], nine years Support:parents, fiance Household:fiance Marital status:single Number of children:0  240 lbs, last 252 lbs, 221 lbs in July Wt Readings from Last 3 Encounters:  06/04/18 272 lb (123.4 kg)  02/17/18 260 lb (117.9 kg)  12/16/17 247 lb (112 kg)    Visit Diagnosis:    ICD-10-CM   1. Attention deficit hyperactivity disorder (ADHD), unspecified ADHD type  F90.9   2. MDD (major depressive disorder), recurrent, in full remission (HCC)  F33.42 buPROPion (WELLBUTRIN XL) 150 MG 24 hr tablet    buPROPion (WELLBUTRIN XL) 300 MG 24 hr tablet  sertraline (ZOLOFT) 100 MG tablet    clonazePAM (KLONOPIN) 2 MG tablet    Past Psychiatric History: Please see initial evaluation for full details. I have reviewed the history. No updates at this time.     Past Medical History:  Past Medical History:  Diagnosis Date  . Hypertension    No past surgical history on file.  Family Psychiatric History: Please see initial evaluation for full details. I have reviewed the history. No updates at this time.     Family History: No family history on file.  Social History:  Social History   Socioeconomic History  . Marital status: Single    Spouse name: Not on file  . Number of children: Not on file   . Years of education: Not on file  . Highest education level: Not on file  Occupational History  . Not on file  Tobacco Use  . Smoking status: Never Smoker  . Smokeless tobacco: Never Used  Substance and Sexual Activity  . Alcohol use: Yes    Comment: Socail   . Drug use: No  . Sexual activity: Not on file  Other Topics Concern  . Not on file  Social History Narrative  . Not on file   Social Determinants of Health   Financial Resource Strain: Not on file  Food Insecurity: Not on file  Transportation Needs: Not on file  Physical Activity: Not on file  Stress: Not on file  Social Connections: Not on file    Allergies: No Known Allergies  Metabolic Disorder Labs: No results found for: HGBA1C, MPG No results found for: PROLACTIN No results found for: CHOL, TRIG, HDL, CHOLHDL, VLDL, LDLCALC No results found for: TSH  Therapeutic Level Labs: No results found for: LITHIUM No results found for: VALPROATE No components found for:  CBMZ  Current Medications: Current Outpatient Medications  Medication Sig Dispense Refill  . [START ON 06/20/2020] amphetamine-dextroamphetamine (ADDERALL XR) 20 MG 24 hr capsule Take 2 capsules (40 mg total) by mouth daily. 60 capsule 0  . amphetamine-dextroamphetamine (ADDERALL XR) 20 MG 24 hr capsule Take 2 capsules (40 mg total) by mouth daily. 60 capsule 0  . [START ON 06/24/2020] buPROPion (WELLBUTRIN XL) 150 MG 24 hr tablet Take total of 450 mg daily (300 mg + 150 mg ) 90 tablet 0  . [START ON 06/24/2020] buPROPion (WELLBUTRIN XL) 300 MG 24 hr tablet Take total of 450 mg daily (300 mg + 150 mg ) 90 tablet 0  . [START ON 06/06/2020] clonazePAM (KLONOPIN) 2 MG tablet Take 1 tablet (2 mg total) by mouth at bedtime. 30 tablet 1  . ibuprofen (ADVIL,MOTRIN) 200 MG tablet Take 200 mg by mouth 3 (three) times daily as needed.    [name] levothyroxine (SYNTHROID, LEVOTHROID) 75 MCG tablet Take 75 mcg by mouth daily before breakfast. 1.5 tabs on weekends    .  nebivolol (BYSTOLIC) 10 MG tablet Take 10 mg by mouth daily.    [name] ON 06/24/2020] sertraline (ZOLOFT) 100 MG tablet Take 1.5 tablets (150 mg total) by mouth at bedtime. 135 tablet 1   No current facility-administered medications for this visit.     Musculoskeletal: Strength & Muscle Tone: N/A Gait & Station: N/A Patient leans: N/A  Psychiatric Specialty Exam: Review of Systems  Psychiatric/Behavioral: Negative for agitation, behavioral problems, confusion, decreased concentration, dysphoric mood, hallucinations, self-injury, sleep disturbance and suicidal ideas. The patient is nervous/anxious. The patient is not hyperactive.   All other systems reviewed and are negative.  There were no vitals taken for this visit.There is no height or weight on file to calculate BMI.  General Appearance: Fairly Groomed  Eye Contact:  Good  Speech:  Clear and Coherent  Volume:  Normal  Mood:  Anxious  Affect:  Appropriate, Congruent and slightly tense  Thought Process:  Coherent  Orientation:  Full (Time, Place, and Person)  Thought Content: Logical   Suicidal Thoughts:  No  Homicidal Thoughts:  No  Memory:  Immediate;   Good  Judgement:  Good  Insight:  Good  Psychomotor Activity:  Normal  Concentration:  Concentration: Good and Attention Span: Good  Recall:  Good  Fund of Knowledge: Good  Language: Good  Akathisia:  No  Handed:  Right  AIMS (if indicated): not done  Assets:  Communication Skills Desire for Improvement  ADL's:  Intact  Cognition: WNL  Sleep:  Poor   Screenings:   Assessment and Plan:  Khaliq Turay is a 40 y.o. year old male with a history of depression,ADHD<hypertension,hyperlipidemia,hypothyroidism, obesity, who presents for follow up appointment for below.   1. MDD (major depressive disorder), recurrent, in full remission (HCC) He denies significant mood symptoms since the last visit, except mild anxiety this morning which he attributes to insomnia  last night. Psychosocial stressors includes conflict with his brother.  Will continue current dose of sertraline to target depression.  We will continue bupropion adjunctive treatment for depression.  Will continue clonazepam for anxiety.  Noted that although he is willing to taper it off in the future, he has past history of worsening in his mood when we tried to taper down this medication.  He is aware of its risk of dependence and oversedation.   2. Attention deficit hyperactivity disorder (ADHD), unspecified ADHD type He had 3 psychological testing in the past,first diagnosed at age 26 according to the patient. He has had a good response to Adderall.  Will continue current dose to target ADHD.  He is aware of its potential risks, which includes but not limited to tachycardia, anxiety, especially with concomitant use of bupropion.   Plan I have reviewed and updated plans as below 1.ContinueAdderall XR 40 mg daily 2.Continue sertraline 150 mg daily 3. ContinueBupropion450 mg daily (150 mg + 300 mg ) 4.Continue clonazepam2mg  at night as needed for anxiety  5.Next appointment:3/29 at 11 AM for 30 mins,  video   Past trials of medication:sertraline, Effexor (had "zaps"), Wellbutrin, Adderall, Adderall XR, Vyvanse,Concerta,Strattera,   The patient demonstrates the following risk factors for suicide: Chronic risk factors for suicide include:psychiatric disorder ofdepression. Acute risk factorsfor suicide include: N/A. Protective factorsfor this patient include: positive social support, coping skills and hope for the future. Considering these factors, the overall suicide risk at this point appears to below. Patientisappropriate for outpatient follow up.  [name], MD 05/23/2020, 10:47 AM

## 2020-05-23 NOTE — Patient Instructions (Signed)
1.ContinueAdderall XR 40 mg daily 2.Continue sertraline 150 mg daily 3. ContinueBupropion450 mg daily (150 mg + 300 mg ) 4.Continue clonazepam2mg  at night as needed for anxiety  5.Next appointment:3/29 at 11 AM

## 2020-07-12 NOTE — Progress Notes (Deleted)
[facility] MD/PA/NP OP Progress Note  07/12/2020 4:43 PM Jon Beck  MRN:  [id number]  Chief Complaint:  HPI: *** Visit Diagnosis: No diagnosis found.  Past Psychiatric History: Please see initial evaluation for full details. I have reviewed the history. No updates at this time.     Past Medical History:  Past Medical History:  Diagnosis Date  . Hypertension    No past surgical history on file.  Family Psychiatric History: Please see initial evaluation for full details. I have reviewed the history. No updates at this time.     Family History: No family history on file.  Social History:  Social History   Socioeconomic History  . Marital status: Single    Spouse name: Not on file  . Number of children: Not on file  . Years of education: Not on file  . Highest education level: Not on file  Occupational History  . Not on file  Tobacco Use  . Smoking status: Never Smoker  . Smokeless tobacco: Never Used  Substance and Sexual Activity  . Alcohol use: Yes    Comment: Socail   . Drug use: No  . Sexual activity: Not on file  Other Topics Concern  . Not on file  Social History Narrative  . Not on file   Social Determinants of Health   Financial Resource Strain: Not on file  Food Insecurity: Not on file  Transportation Needs: Not on file  Physical Activity: Not on file  Stress: Not on file  Social Connections: Not on file    Allergies: No Known Allergies  Metabolic Disorder Labs: No results found for: HGBA1C, MPG No results found for: PROLACTIN No results found for: CHOL, TRIG, HDL, CHOLHDL, VLDL, LDLCALC No results found for: TSH  Therapeutic Level Labs: No results found for: LITHIUM No results found for: VALPROATE No components found for:  CBMZ  Current Medications: Current Outpatient Medications  Medication Sig Dispense Refill  . amphetamine-dextroamphetamine (ADDERALL XR) 20 MG 24 hr capsule Take 2 capsules (40 mg total) by mouth daily. 60 capsule 0  .  amphetamine-dextroamphetamine (ADDERALL XR) 20 MG 24 hr capsule Take 2 capsules (40 mg total) by mouth daily. 60 capsule 0  . buPROPion (WELLBUTRIN XL) 150 MG 24 hr tablet Take total of 450 mg daily (300 mg + 150 mg ) 90 tablet 0  . buPROPion (WELLBUTRIN XL) 300 MG 24 hr tablet Take total of 450 mg daily (300 mg + 150 mg ) 90 tablet 0  . clonazePAM (KLONOPIN) 2 MG tablet Take 1 tablet (2 mg total) by mouth at bedtime. 30 tablet 1  . ibuprofen (ADVIL,MOTRIN) 200 MG tablet Take 200 mg by mouth 3 (three) times daily as needed.    [name] levothyroxine (SYNTHROID, LEVOTHROID) 75 MCG tablet Take 75 mcg by mouth daily before breakfast. 1.5 tabs on weekends    . nebivolol (BYSTOLIC) 10 MG tablet Take 10 mg by mouth daily.    . sertraline (ZOLOFT) 100 MG tablet Take 1.5 tablets (150 mg total) by mouth at bedtime. 135 tablet 1   No current facility-administered medications for this visit.     Musculoskeletal: Strength & Muscle Tone: N/A Gait & Station: N/A Patient leans: N/A  Psychiatric Specialty Exam: Review of Systems  There were no vitals taken for this visit.There is no height or weight on file to calculate BMI.  General Appearance: {Appearance:22683}  Eye Contact:  {BHH EYE CONTACT:22684}  Speech:  Clear and Coherent  Volume:  Normal  Mood:  {  BHH SVXB:93903}  Affect:  {Affect (PAA):22687}  Thought Process:  Coherent  Orientation:  Full (Time, Place, and Person)  Thought Content: Logical   Suicidal Thoughts:  {ST/HT (PAA):22692}  Homicidal Thoughts:  {ST/HT (PAA):22692}  Memory:  Immediate;   Good  Judgement:  {Judgement (PAA):22694}  Insight:  {Insight (PAA):22695}  Psychomotor Activity:  Normal  Concentration:  Concentration: Good and Attention Span: Good  Recall:  Good  Fund of Knowledge: Good  Language: Good  Akathisia:  No  Handed:  Right  AIMS (if indicated): not done  Assets:  Communication Skills Desire for Improvement  ADL's:  Intact  Cognition: WNL  Sleep:  {BHH  GOOD/FAIR/POOR:22877}   Screenings:   Assessment and Plan:  Jon Beck is a 40 y.o. year old male with a history of depression,ADHD<hypertension,hyperlipidemia,hypothyroidism, obesity, who presents for follow up appointment for below.    1. MDD (major depressive disorder), recurrent, in full remission (HCC) He denies significant mood symptoms since the last visit, except mild anxiety this morning which he attributes to insomnia last night. Psychosocial stressors includes conflict with his brother.  Will continue current dose of sertraline to target depression.  We will continue bupropion adjunctive treatment for depression.  Will continue clonazepam for anxiety.  Noted that although he is willing to taper it off in the future, he has past history of worsening in his mood when we tried to taper down this medication.  He is aware of its risk of dependence and oversedation.   2. Attention deficit hyperactivity disorder (ADHD), unspecified ADHD type He had 3 psychological testing in the past,first diagnosed at age 53 according to the patient. He has had a good response to Adderall.  Will continue current dose to target ADHD.  He is aware of its potential risks, which includes but not limited to tachycardia, anxiety, especially with concomitant use of bupropion.   Plan  1.ContinueAdderall XR 40 mg daily 2.Continue sertraline 150 mg daily 3. ContinueBupropion450 mg daily (150 mg + 300 mg ) 4.Continue clonazepam2mg  at night as needed for anxiety  5.Next appointment:3/29 at 11 AM for 30 mins,  video   Past trials of medication:sertraline, Effexor (had "zaps"), Wellbutrin, Adderall, Adderall XR, Vyvanse,Concerta,Strattera,   The patient demonstrates the following risk factors for suicide: Chronic risk factors for suicide include:psychiatric disorder ofdepression. Acute risk factorsfor suicide include: N/A. Protective factorsfor this patient include: positive  social support, coping skills and hope for the future. Considering these factors, the overall suicide risk at this point appears to below. Patientisappropriate for outpatient follow up.   [name], MD 07/12/2020, 4:43 PM

## 2020-07-18 ENCOUNTER — Telehealth: Payer: No Typology Code available for payment source | Admitting: Psychiatry

## 2020-07-20 NOTE — Progress Notes (Signed)
Virtual Visit via Video Note  I connected with Jon Beck on 07/25/20 at  3:30 PM EDT by a video enabled telemedicine application and verified that I am speaking with the correct person using two identifiers.  Location: Patient: home Provider: office Persons participated in the visit- patient, provider   I discussed the limitations of evaluation and management by telemedicine and the availability of in person appointments. The patient expressed understanding and agreed to proceed.    I discussed the assessment and treatment plan with the patient. The patient was provided an opportunity to ask questions and all were answered. The patient agreed with the plan and demonstrated an understanding of the instructions.   The patient was advised to call back or seek an in-person evaluation if the symptoms worsen or if the condition fails to improve as anticipated.  I provided 15 minutes of non-face-to-face time during this encounter.   [name], MD    [facility] MD/PA/NP OP Progress Note  07/25/2020 3:55 PM Jon Beck  MRN:  [id number]  Chief Complaint:  Chief Complaint    ADHD; Follow-up; Depression     HPI:  This is a follow-up appointment for depression and ADHD.  He states that he has been busy at work.  However, he has been handling things well.  He passed certification exam for not working without any difficulty.  He enjoys having his parents in town on weekend.  He had gained several pounds since he had a COVID.  Both him and his fiance is not on diet anymore since they suffers from COVID.  They are trying to get back on routine.  He continues to have shortness of breath, although it has been getting better.  He denies any mood symptoms except he had anxiety when he was taking lower dose of sertraline so that his fiance can take his medication as she could not get refill.  He denies feeling depressed.  He denies SI.  Although he wants to lower the dose of clonazepam in the  future, he first wants to work on to get back on his routine.  He denies alcohol or drug use.   Employment:customer Merchandiser, retail at [employer], nine years Support:parents, fiance Household:fiance Marital status:single Number of children:0  283 lbs Wt Readings from Last 3 Encounters:  06/04/18 272 lb (123.4 kg)  02/17/18 260 lb (117.9 kg)  12/16/17 247 lb (112 kg)    Visit Diagnosis:    ICD-10-CM   1. Attention deficit hyperactivity disorder (ADHD), unspecified ADHD type  F90.9   2. MDD (major depressive disorder), recurrent, in full remission (HCC)  F33.42 buPROPion (WELLBUTRIN XL) 150 MG 24 hr tablet    buPROPion (WELLBUTRIN XL) 300 MG 24 hr tablet    sertraline (ZOLOFT) 100 MG tablet    clonazePAM (KLONOPIN) 2 MG tablet    Past Psychiatric History: Please see initial evaluation for full details. I have reviewed the history. No updates at this time.     Past Medical History:  Past Medical History:  Diagnosis Date  . Hypertension    No past surgical history on file.  Family Psychiatric History: Please see initial evaluation for full details. I have reviewed the history. No updates at this time.     Family History: No family history on file.  Social History:  Social History   Socioeconomic History  . Marital status: Single    Spouse name: Not on file  . Number of children: Not on file  . Years of education: Not  on file  . Highest education level: Not on file  Occupational History  . Not on file  Tobacco Use  . Smoking status: Never Smoker  . Smokeless tobacco: Never Used  Substance and Sexual Activity  . Alcohol use: Yes    Comment: Socail   . Drug use: No  . Sexual activity: Not on file  Other Topics Concern  . Not on file  Social History Narrative  . Not on file   Social Determinants of Health   Financial Resource Strain: Not on file  Food Insecurity: Not on file  Transportation Needs: Not on file  Physical Activity: Not on file  Stress: Not  on file  Social Connections: Not on file    Allergies: No Known Allergies  Metabolic Disorder Labs: No results found for: HGBA1C, MPG No results found for: PROLACTIN No results found for: CHOL, TRIG, HDL, CHOLHDL, VLDL, LDLCALC No results found for: TSH  Therapeutic Level Labs: No results found for: LITHIUM No results found for: VALPROATE No components found for:  CBMZ  Current Medications: Current Outpatient Medications  Medication Sig Dispense Refill  . amphetamine-dextroamphetamine (ADDERALL XR) 20 MG 24 hr capsule Take 2 capsules (40 mg total) by mouth every morning. 60 capsule 0  . [START ON 08/24/2020] amphetamine-dextroamphetamine (ADDERALL XR) 20 MG 24 hr capsule Take 2 capsules (40 mg total) by mouth every morning. 60 capsule 0  . [START ON 09/23/2020] amphetamine-dextroamphetamine (ADDERALL XR) 20 MG 24 hr capsule Take 2 capsules (40 mg total) by mouth every morning. 60 capsule 0  . [START ON 09/24/2020] buPROPion (WELLBUTRIN XL) 150 MG 24 hr tablet Take total of 450 mg daily (300 mg + 150 mg ) 90 tablet 1  . [START ON 09/24/2020] buPROPion (WELLBUTRIN XL) 300 MG 24 hr tablet Take total of 450 mg daily (300 mg + 150 mg ) 90 tablet 1  . [START ON 08/07/2020] clonazePAM (KLONOPIN) 2 MG tablet Take 1 tablet (2 mg total) by mouth at bedtime. 30 tablet 1  . ibuprofen (ADVIL,MOTRIN) 200 MG tablet Take 200 mg by mouth 3 (three) times daily as needed.    [name] levothyroxine (SYNTHROID, LEVOTHROID) 75 MCG tablet Take 75 mcg by mouth daily before breakfast. 1.5 tabs on weekends    . nebivolol (BYSTOLIC) 10 MG tablet Take 10 mg by mouth daily.    [name] ON 09/24/2020] sertraline (ZOLOFT) 100 MG tablet Take 1.5 tablets (150 mg total) by mouth at bedtime. 135 tablet 1   No current facility-administered medications for this visit.     Musculoskeletal: Strength & Muscle Tone: N/A Gait & Station: N/A Patient leans: N/A  Psychiatric Specialty Exam: Review of Systems  Psychiatric/Behavioral:  Negative for agitation, behavioral problems, confusion, decreased concentration, dysphoric mood, hallucinations, self-injury, sleep disturbance and suicidal ideas. The patient is nervous/anxious. The patient is not hyperactive.   All other systems reviewed and are negative.   There were no vitals taken for this visit.There is no height or weight on file to calculate BMI.  General Appearance: Fairly Groomed  Eye Contact:  Good  Speech:  Clear and Coherent  Volume:  Normal  Mood:  good  Affect:  Appropriate, Congruent and euthymic  Thought Process:  Coherent  Orientation:  Full (Time, Place, and Person)  Thought Content: Logical   Suicidal Thoughts:  No  Homicidal Thoughts:  No  Memory:  Immediate;   Good  Judgement:  Good  Insight:  Good  Psychomotor Activity:  Normal  Concentration:  Concentration: Good  and Attention Span: Good  Recall:  Good  Fund of Knowledge: Good  Language: Good  Akathisia:  No  Handed:  Right  AIMS (if indicated): not done  Assets:  Communication Skills Desire for Improvement  ADL's:  Intact  Cognition: WNL  Sleep:  Good   Screenings: PHQ2-9   Flowsheet Row Video Visit [facility]/25/2020 in [facility]  PHQ-2 Total Score 0       Assessment and Plan:  Kenzel Ruesch is a 40 y.o. year old male with a history of Dejour Vos is a 40 y.o. year old male with a history of depression,ADHD<hypertension,hyperlipidemia,hypothyroidism, obesity, who presents for follow up appointment for below.   1. MDD (major depressive disorder), recurrent, in full remission (HCC) He denies significant mood symptoms since the last visit.  Recent psychosocial stressors include suffering from COVID/shortness of breath.  Will continue current medication regimen.  Will continue sertraline and bupropion to target depression.  Will continue clonazepam as needed for anxiety.  Noted that although he is willing to taper down clonazepam, he would like to  hold this at this time to first work on having regular routine/more physical exercise.   2. Attention deficit hyperactivity disorder (ADHD), unspecified ADHD type He had 3 psychological testing in the past,first diagnosed at age 2 according to the patient. He reports good benefit from Adderall.  Will continue current dose to target ADHD.   This clinician has discussed the side effect associated with medication prescribed during this encounter. Please refer to notes in the previous encounters for more details.   Plan I have reviewed and updated plans as below 1.ContinueAdderall XR 40 mg daily 2.Continue sertraline 150 mg daily 3. ContinueBupropion450 mg daily (150 mg + 300 mg ) 4.Continue clonazepam2mg  at night as needed for anxiety  5.Next appointment:7/1 at 9 AM for 30 mins,  video   Past trials of medication:sertraline, Effexor (had "zaps"), Wellbutrin, Adderall, Adderall XR, Vyvanse,Concerta,Strattera,   The patient demonstrates the following risk factors for suicide: Chronic risk factors for suicide include:psychiatric disorder ofdepression. Acute risk factorsfor suicide include: N/A. Protective factorsfor this patient include: positive social support, coping skills and hope for the future. Considering these factors, the overall suicide risk at this point appears to below. Patientisappropriate for outpatient follow up., who presents for follow up appointment for below.    [name], MD 07/25/2020, 3:55 PM

## 2020-07-25 ENCOUNTER — Other Ambulatory Visit: Payer: Self-pay

## 2020-07-25 ENCOUNTER — Telehealth (INDEPENDENT_AMBULATORY_CARE_PROVIDER_SITE_OTHER): Payer: No Typology Code available for payment source | Admitting: Psychiatry

## 2020-07-25 ENCOUNTER — Encounter: Payer: Self-pay | Admitting: Psychiatry

## 2020-07-25 DIAGNOSIS — F3342 Major depressive disorder, recurrent, in full remission: Secondary | ICD-10-CM

## 2020-07-25 DIAGNOSIS — F909 Attention-deficit hyperactivity disorder, unspecified type: Secondary | ICD-10-CM | POA: Diagnosis not present

## 2020-07-25 MED ORDER — AMPHETAMINE-DEXTROAMPHET ER 20 MG PO CP24: 40.0000 mg | ORAL_CAPSULE | ORAL | 0 refills | Status: DC

## 2020-07-25 MED ORDER — BUPROPION HCL ER (XL) 300 MG PO TB24: ORAL_TABLET | ORAL | 1 refills | Status: DC

## 2020-07-25 MED ORDER — BUPROPION HCL ER (XL) 150 MG PO TB24: ORAL_TABLET | ORAL | 1 refills | Status: DC

## 2020-07-25 MED ORDER — CLONAZEPAM 2 MG PO TABS: 2.0000 mg | ORAL_TABLET | Freq: Every day | ORAL | 1 refills | Status: DC

## 2020-07-25 MED ORDER — SERTRALINE HCL 100 MG PO TABS: 150.0000 mg | ORAL_TABLET | Freq: Every day | ORAL | 1 refills | Status: DC

## 2020-08-26 ENCOUNTER — Emergency Department (HOSPITAL_COMMUNITY): Payer: No Typology Code available for payment source

## 2020-08-26 ENCOUNTER — Encounter (HOSPITAL_COMMUNITY): Payer: Self-pay | Admitting: Emergency Medicine

## 2020-08-26 ENCOUNTER — Emergency Department (HOSPITAL_COMMUNITY)
Admission: EM | Admit: 2020-08-26 | Discharge: 2020-08-26 | Disposition: A | Discharge status: Home / Self Care | Payer: No Typology Code available for payment source | Attending: Emergency Medicine | Admitting: Emergency Medicine

## 2020-08-26 ENCOUNTER — Other Ambulatory Visit: Payer: Self-pay

## 2020-08-26 DIAGNOSIS — I861 Scrotal varices: Secondary | ICD-10-CM | POA: Insufficient documentation

## 2020-08-26 DIAGNOSIS — R1031 Right lower quadrant pain: Secondary | ICD-10-CM | POA: Diagnosis present

## 2020-08-26 DIAGNOSIS — K654 Sclerosing mesenteritis: Secondary | ICD-10-CM | POA: Diagnosis not present

## 2020-08-26 DIAGNOSIS — I10 Essential (primary) hypertension: Secondary | ICD-10-CM | POA: Diagnosis not present

## 2020-08-26 DIAGNOSIS — M545 Low back pain, unspecified: Secondary | ICD-10-CM

## 2020-08-26 DIAGNOSIS — Z79899 Other long term (current) drug therapy: Secondary | ICD-10-CM | POA: Diagnosis not present

## 2020-08-26 LAB — BASIC METABOLIC PANEL
Anion gap: 11 (ref 5–15)
BUN: 18 mg/dL (ref 6–20)
CO2: 23 mmol/L (ref 22–32)
Calcium: 9.8 mg/dL (ref 8.9–10.3)
Chloride: 106 mmol/L (ref 98–111)
Creatinine, Ser: 1.24 mg/dL (ref 0.61–1.24)
GFR, Estimated: >60 mL/min (ref >60)
Glucose, Bld: 96 mg/dL (ref 70–99)
Potassium: 4.2 mmol/L (ref 3.5–5.1)
Sodium: 140 mmol/L (ref 135–145)

## 2020-08-26 LAB — CBC WITH DIFFERENTIAL/PLATELET
Abs Immature Granulocytes: 0.02 10*3/uL (ref 0.00–0.07)
Basophils Absolute: 0 10*3/uL (ref 0.0–0.1)
Basophils Relative: 0 %
Eosinophils Absolute: 0.1 10*3/uL (ref 0.0–0.5)
Eosinophils Relative: 2 %
HCT: 43.6 % (ref 39.0–52.0)
Hemoglobin: 14.5 g/dL (ref 13.0–17.0)
Immature Granulocytes: 0 %
Lymphocytes Relative: 35 %
Lymphs Abs: 2.9 10*3/uL (ref 0.7–4.0)
MCH: 28.6 pg (ref 26.0–34.0)
MCHC: 33.3 g/dL (ref 30.0–36.0)
MCV: 86 fL (ref 80.0–100.0)
Monocytes Absolute: 0.8 10*3/uL (ref 0.1–1.0)
Monocytes Relative: 9 %
Neutro Abs: 4.6 10*3/uL (ref 1.7–7.7)
Neutrophils Relative %: 54 %
Platelets: 255 10*3/uL (ref 150–400)
RBC: 5.07 MIL/uL (ref 4.22–5.81)
RDW: 13.3 % (ref 11.5–15.5)
WBC: 8.5 10*3/uL (ref 4.0–10.5)
nRBC: 0 % (ref 0.0–0.2)

## 2020-08-26 LAB — URINALYSIS, ROUTINE W REFLEX MICROSCOPIC
Bilirubin Urine: NEGATIVE
Glucose, UA: NEGATIVE mg/dL
Hgb urine dipstick: NEGATIVE
Ketones, ur: NEGATIVE mg/dL
Leukocytes,Ua: NEGATIVE
Nitrite: NEGATIVE
Protein, ur: NEGATIVE mg/dL
Specific Gravity, Urine: 1.021 (ref 1.005–1.030)
pH: 5 (ref 5.0–8.0)

## 2020-08-26 MED ORDER — HYDROCODONE-ACETAMINOPHEN 5-325 MG PO TABS: 1.0000 | ORAL_TABLET | Freq: Four times a day (QID) | ORAL | 0 refills | Status: DC | PRN

## 2020-08-26 MED ORDER — MORPHINE SULFATE (PF) 4 MG/ML IV SOLN
4.0000 mg | Freq: Once | INTRAVENOUS | Status: AC
  Administered 2020-08-26: 4 mg via INTRAVENOUS
  Filled 2020-08-26: qty 1

## 2020-08-26 MED ORDER — METHOCARBAMOL 500 MG PO TABS: 500.0000 mg | ORAL_TABLET | Freq: Two times a day (BID) | ORAL | 0 refills | Status: DC

## 2020-08-26 MED ORDER — KETOROLAC TROMETHAMINE 30 MG/ML IJ SOLN
30.0000 mg | Freq: Once | INTRAMUSCULAR | Status: AC
  Administered 2020-08-26: 30 mg via INTRAVENOUS
  Filled 2020-08-26: qty 1

## 2020-08-26 NOTE — Discharge Instructions (Addendum)
Your workup was reassuring in the ED today. Your CT scan showed some inflammation along the mesentery which may be an incidental finding. You can take Ibuprofen as needed for this.   Pick up medication and take as needed for pain. DO NOT DRIVE WHILE ON THE MUSCLE RELAXER AS IT CAN MAKE YOU DROWSY. I would recommend taking this at nighttime to help you sleep.   Follow up with your PCP next week for further evaluation  Return to the ED for any new/worsening symptoms

## 2020-08-26 NOTE — ED Provider Notes (Addendum)
[facility]-EMERGENCY DEPT Provider Note   CSN: [id number] Arrival date & time: 08/26/20  1751     History Chief Complaint  Patient presents with  . Back Pain  . Flank Pain  . Groin Pain  . Testicle Pain    Jon Beck is a 40 y.o. male who presents to the ED today with complaint of gradual onset, intermittent, right lower back pain radiating into RLQ/right testicle for the past week. Pt reports while walking today the pain became so intense it caused him to fall on the ground. No LOC. He denies any nausea, vomiting, diarrhea, urinary symptoms. He reports hx of kidney stones in the past however this feels different to him. No fevers, chills, testicular swelling, penile discharge, or any other associated symptoms. He hasn't taken anything for the pain however it has not been as bad as today prompting him to come to the ED.   The history is provided by the patient and medical records.       Past Medical History:  Diagnosis Date  . Hypertension     Patient Active Problem List   Diagnosis Date Noted  . MDD (major depressive disorder), recurrent, in full remission (HCC) 04/02/2019  . MDD (major depressive disorder), recurrent episode, moderate (HCC) 02/20/2017  . Attention deficit hyperactivity disorder (ADHD) 02/20/2017    History reviewed. No pertinent surgical history.     History reviewed. No pertinent family history.  Social History   Tobacco Use  . Smoking status: Never Smoker  . Smokeless tobacco: Never Used  Substance Use Topics  . Alcohol use: Yes    Comment: Socail   . Drug use: No    Home Medications Prior to Admission medications   Medication Sig Start Date End Date Taking? Authorizing Provider  HYDROcodone-acetaminophen (NORCO/VICODIN) 5-325 MG tablet Take 1 tablet by mouth every 6 (six) hours as needed. 08/26/20  Yes Pao Haffey, PA-C  methocarbamol (ROBAXIN) 500 MG tablet Take 1 tablet (500 mg total) by mouth 2 (two) times  daily. 08/26/20  Yes Rozella Servello, PA-C  amphetamine-dextroamphetamine (ADDERALL XR) 20 MG 24 hr capsule Take 2 capsules (40 mg total) by mouth every morning. 07/25/20 08/24/20  [name], MD  amphetamine-dextroamphetamine (ADDERALL XR) 20 MG 24 hr capsule Take 2 capsules (40 mg total) by mouth every morning. 08/24/20 09/23/20  [name], MD  amphetamine-dextroamphetamine (ADDERALL XR) 20 MG 24 hr capsule Take 2 capsules (40 mg total) by mouth every morning. 09/23/20 10/23/20  [name], MD  buPROPion (WELLBUTRIN XL) 150 MG 24 hr tablet Take total of 450 mg daily (300 mg + 150 mg ) 09/24/20   [name], MD  buPROPion (WELLBUTRIN XL) 300 MG 24 hr tablet Take total of 450 mg daily (300 mg + 150 mg ) 09/24/20   [name], MD  clonazePAM (KLONOPIN) 2 MG tablet Take 1 tablet (2 mg total) by mouth at bedtime. 08/07/20   [name], MD  ibuprofen (ADVIL,MOTRIN) 200 MG tablet Take 200 mg by mouth 3 (three) times daily as needed.    [provider]  levothyroxine (SYNTHROID, LEVOTHROID) 75 MCG tablet Take 75 mcg by mouth daily before breakfast. 1.5 tabs on weekends    [provider]  nebivolol (BYSTOLIC) 10 MG tablet Take 10 mg by mouth daily.    [provider]  sertraline (ZOLOFT) 100 MG tablet Take 1.5 tablets (150 mg total) by mouth at bedtime. 09/24/20   [name], MD    Allergies  Patient has no known allergies.  Review of Systems   Review of Systems  Constitutional: Negative for chills and fever.  Gastrointestinal: Positive for abdominal pain. Negative for diarrhea, nausea and vomiting.  Genitourinary: Positive for flank pain and testicular pain. Negative for dysuria, frequency and penile discharge.  All other systems reviewed and are negative.   Physical Exam Updated Vital Signs BP (!) 161/95   Pulse 97   Temp 97.8 F (36.6 C) (Oral)   Resp 18   Ht 5\' 11"  (1.803 m)   Wt 106.6 kg   SpO2 100%   BMI 32.78 kg/m   Physical Exam Vitals and nursing  note reviewed.  Constitutional:      Appearance: He is not ill-appearing or diaphoretic.  HENT:     Head: Normocephalic and atraumatic.  Eyes:     Conjunctiva/sclera: Conjunctivae normal.  Cardiovascular:     Rate and Rhythm: Normal rate and regular rhythm.     Pulses: Normal pulses.  Pulmonary:     Effort: Pulmonary effort is normal.     Breath sounds: Normal breath sounds. No wheezing, rhonchi or rales.  Abdominal:     Palpations: Abdomen is soft.     Tenderness: There is abdominal tenderness. There is right CVA tenderness. There is no guarding or rebound.  Genitourinary:    Comments: Chaperone present for exam Circumcised penis without phimosis/paraphimosis, hypospadias, erythema, tenderness, or discharge. No rashes or lesions. Testes with no masses or tenderness, no swelling, and cremasterics reflex present bilaterally. No abnormal lie. No inguinal hernias or adenopathy present. Musculoskeletal:     Cervical back: Neck supple.  Skin:    General: Skin is warm and dry.  Neurological:     Mental Status: He is alert.     ED Results / Procedures / Treatments   Labs (all labs ordered are listed, but only abnormal results are displayed) Labs Reviewed  BASIC METABOLIC PANEL  CBC WITH DIFFERENTIAL/PLATELET  URINALYSIS, ROUTINE W REFLEX MICROSCOPIC    EKG None  Radiology CT Renal Stone Study  Result Date: 08/26/2020 CLINICAL DATA:  Right flank pain EXAM: CT ABDOMEN AND PELVIS WITHOUT CONTRAST TECHNIQUE: Multidetector CT imaging of the abdomen and pelvis was performed following the standard protocol without IV contrast. COMPARISON:  None. FINDINGS: LOWER CHEST: Normal. HEPATOBILIARY: Normal hepatic contours. No intra- or extrahepatic biliary dilatation. Normal gallbladder. PANCREAS: Normal pancreas. No ductal dilatation or peripancreatic fluid collection. SPLEEN: Normal. ADRENALS/URINARY TRACT: The adrenal glands are normal. No hydronephrosis, nephroureterolithiasis or solid renal  mass. The urinary bladder is normal for degree of distention STOMACH/BOWEL: There is no hiatal hernia. Normal duodenal course and caliber. No small bowel dilatation or inflammation. No focal colonic abnormality. Normal appendix. VASCULAR/LYMPHATIC: Hazy attenuation of the mid abdominal mesenteric fat. No lymphadenopathy. Normal vessels. REPRODUCTIVE: Normal prostate size with symmetric seminal vesicles. MUSCULOSKELETAL. No bony spinal canal stenosis or focal osseous abnormality. OTHER: None. IMPRESSION: 1. No obstructive uropathy or nephroureterolithiasis. 2. Hazy attenuation of the mid abdominal mesenteric fat may indicate mesenteric panniculitis. Electronically Signed   By: Deatra RobinsonKevin  Herman M.D.   On: 08/26/2020 19:18   US SCROTUM W/DOPPLER  Result Date: 08/26/2020 CLINICAL DATA:  Right testicular pain. EXAM: SCROTAL ULTRASOUND DOPPLER ULTRASOUND OF THE TESTICLES TECHNIQUE: Complete ultrasound examination of the testicles, epididymis, and other scrotal structures was performed. Color and spectral Doppler ultrasound were also utilized to evaluate blood flow to the testicles. COMPARISON:  None. FINDINGS: Right testicle Measurements: 3.1 cm x 2.5 cm x 2.5 cm. No mass or  microlithiasis visualized. Left testicle Measurements: 2.9 cm x 1.6 cm x 2.8 cm. No mass or microlithiasis visualized. Right epididymis:  Normal in size and appearance. Left epididymis:  Normal in size and appearance. Hydrocele:  None visualized. Varicocele:  A small left-sided varicocele is noted. Pulsed Doppler interrogation of both testes demonstrates normal low resistance arterial and venous waveforms bilaterally. IMPRESSION: Small left-sided varicocele. Electronically Signed   By: Aram Candela M.D.   On: 08/26/2020 20:34    Procedures Procedures   Medications Ordered in ED Medications  ketorolac (TORADOL) 30 MG/ML injection 30 mg (30 mg Intravenous Given 08/26/20 1843)  morphine 4 MG/ML injection 4 mg (4 mg Intravenous Given 08/26/20  2003)    ED Course  I have reviewed the triage vital signs and the nursing notes.  Pertinent labs & imaging results that were available during my care of the patient were reviewed by me and considered in my medical decision making (see chart for details).    MDM Rules/Calculators/A&P                          40 year old male presents to the ED today complaining of sudden onset worsening right lower back/right lower quadrant/right testicular pain today.  Has been dealing with the pain on and off for about a week.  History of kidney stones however patient states this feels different.  On arrival to the ED vitals are stable.  Patient appears to be no acute distress.  Chaperone present for testicular exam, patient without any testicular tenderness on exam.  No overlying skin changes.  Known abnormal lie.  He does have right lower quadrant abdominal tenderness palpation as well as right CVA tenderness palpation.  Concern for kidney stone at this time.  Will work-up for same.  Toradol provided for pain.   CBC without leukocytosis.  Hemoglobin stable at 14.5. BMP without electrolyte abnormalities.  Creatinine 1.24 however this is patient's baseline. Urinalysis without infection or any signs of hemoglobin.   CT scan: IMPRESSION:  1. No obstructive uropathy or nephroureterolithiasis.  2. Hazy attenuation of the mid abdominal mesenteric fat may indicate  mesenteric panniculitis.   Appendix appears normal on CT scan at this time. Given complaint of testicular pain and unequivocal CT scan will order testicular ultrasound at this time.   Ultrasound with left varicocele. No other findings. On reevaluation after morphine pt resting comfortably. Will discharge home at this time. Will treat for musculoskeletal/back pain at this time as work up reassuring today. Pt instructed to follow up with PCP next week for further eval and to return to the ED for any worsening symptoms. He is in agreement with plan and  stable for discharge home.   This note was prepared using Dragon voice recognition software and may include unintentional dictation errors due to the inherent limitations of voice recognition software.  Final Clinical Impression(s) / ED Diagnoses Final diagnoses:  Acute right-sided low back pain, unspecified whether sciatica present  Right inguinal pain  Varicocele  Mesenteric panniculitis (HCC)    Rx / DC Orders ED Discharge Orders         Ordered    HYDROcodone-acetaminophen (NORCO/VICODIN) 5-325 MG tablet  Every 6 hours PRN        08/26/20 2055    methocarbamol (ROBAXIN) 500 MG tablet  2 times daily        08/26/20 2055           Discharge Instructions  Your workup was reassuring in the ED today. Your CT scan showed some inflammation along the mesentery which may be an incidental finding. You can take Ibuprofen as needed for this.   Pick up medication and take as needed for pain. DO NOT DRIVE WHILE ON THE MUSCLE RELAXER AS IT CAN MAKE YOU DROWSY. I would recommend taking this at nighttime to help you sleep.   Follow up with your PCP next week for further evaluation  Return to the ED for any new/worsening symptoms       Tanda Rockers, Cordelia Poche 08/26/20 2058    Cheryll Cockayne, MD 08/26/20 2231

## 2020-08-26 NOTE — ED Triage Notes (Signed)
Over the past week, the patient has suffered from pain in low back, right side, right groin and right testicle. A few hours ago the pain became so intense the patient dropped to the floor. It has lessened since with rest.

## 2020-08-26 NOTE — ED Notes (Signed)
On-call U/S contacted per PA request. RN advised. [employer]

## 2020-09-06 ENCOUNTER — Emergency Department (HOSPITAL_COMMUNITY): Payer: No Typology Code available for payment source

## 2020-09-06 ENCOUNTER — Other Ambulatory Visit: Payer: Self-pay

## 2020-09-06 ENCOUNTER — Inpatient Hospital Stay (HOSPITAL_COMMUNITY)
Admission: EM | Admit: 2020-09-06 | Discharge: 2020-09-08 | DRG: 519 | Disposition: A | Discharge status: Home / Self Care | Payer: No Typology Code available for payment source | Attending: Internal Medicine | Admitting: Internal Medicine

## 2020-09-06 ENCOUNTER — Encounter (HOSPITAL_COMMUNITY): Payer: Self-pay

## 2020-09-06 DIAGNOSIS — M48062 Spinal stenosis, lumbar region with neurogenic claudication: Secondary | ICD-10-CM | POA: Diagnosis not present

## 2020-09-06 DIAGNOSIS — M48061 Spinal stenosis, lumbar region without neurogenic claudication: Principal | ICD-10-CM | POA: Diagnosis present

## 2020-09-06 DIAGNOSIS — Z8616 Personal history of COVID-19: Secondary | ICD-10-CM | POA: Diagnosis not present

## 2020-09-06 DIAGNOSIS — Z79890 Hormone replacement therapy (postmenopausal): Secondary | ICD-10-CM | POA: Diagnosis not present

## 2020-09-06 DIAGNOSIS — Z419 Encounter for procedure for purposes other than remedying health state, unspecified: Secondary | ICD-10-CM

## 2020-09-06 DIAGNOSIS — D72829 Elevated white blood cell count, unspecified: Secondary | ICD-10-CM | POA: Diagnosis not present

## 2020-09-06 DIAGNOSIS — G834 Cauda equina syndrome: Secondary | ICD-10-CM | POA: Diagnosis present

## 2020-09-06 DIAGNOSIS — F419 Anxiety disorder, unspecified: Secondary | ICD-10-CM | POA: Diagnosis present

## 2020-09-06 DIAGNOSIS — Z79899 Other long term (current) drug therapy: Secondary | ICD-10-CM

## 2020-09-06 DIAGNOSIS — I10 Essential (primary) hypertension: Secondary | ICD-10-CM | POA: Diagnosis present

## 2020-09-06 DIAGNOSIS — E039 Hypothyroidism, unspecified: Secondary | ICD-10-CM | POA: Diagnosis present

## 2020-09-06 DIAGNOSIS — M5126 Other intervertebral disc displacement, lumbar region: Secondary | ICD-10-CM | POA: Diagnosis present

## 2020-09-06 DIAGNOSIS — Z20822 Contact with and (suspected) exposure to covid-19: Secondary | ICD-10-CM | POA: Diagnosis present

## 2020-09-06 DIAGNOSIS — F909 Attention-deficit hyperactivity disorder, unspecified type: Secondary | ICD-10-CM | POA: Diagnosis present

## 2020-09-06 DIAGNOSIS — Z6836 Body mass index (BMI) 36.0-36.9, adult: Secondary | ICD-10-CM

## 2020-09-06 DIAGNOSIS — F3342 Major depressive disorder, recurrent, in full remission: Secondary | ICD-10-CM | POA: Diagnosis present

## 2020-09-06 DIAGNOSIS — Y92239 Unspecified place in hospital as the place of occurrence of the external cause: Secondary | ICD-10-CM | POA: Diagnosis not present

## 2020-09-06 DIAGNOSIS — T380X5A Adverse effect of glucocorticoids and synthetic analogues, initial encounter: Secondary | ICD-10-CM | POA: Diagnosis not present

## 2020-09-06 HISTORY — DX: Cauda equina syndrome: G83.4

## 2020-09-06 LAB — URINALYSIS, ROUTINE W REFLEX MICROSCOPIC
Bilirubin Urine: NEGATIVE
Glucose, UA: NEGATIVE mg/dL
Hgb urine dipstick: NEGATIVE
Ketones, ur: NEGATIVE mg/dL
Leukocytes,Ua: NEGATIVE
Nitrite: NEGATIVE
Protein, ur: NEGATIVE mg/dL
Specific Gravity, Urine: 1.021 (ref 1.005–1.030)
pH: 6 (ref 5.0–8.0)

## 2020-09-06 LAB — CBC WITH DIFFERENTIAL/PLATELET
Abs Immature Granulocytes: 0.07 10*3/uL (ref 0.00–0.07)
Basophils Absolute: 0 10*3/uL (ref 0.0–0.1)
Basophils Relative: 0 %
Eosinophils Absolute: 0 10*3/uL (ref 0.0–0.5)
Eosinophils Relative: 0 %
HCT: 48.9 % (ref 39.0–52.0)
Hemoglobin: 15.8 g/dL (ref 13.0–17.0)
Immature Granulocytes: 1 %
Lymphocytes Relative: 15 %
Lymphs Abs: 2.1 10*3/uL (ref 0.7–4.0)
MCH: 27.8 pg (ref 26.0–34.0)
MCHC: 32.3 g/dL (ref 30.0–36.0)
MCV: 85.9 fL (ref 80.0–100.0)
Monocytes Absolute: 0.8 10*3/uL (ref 0.1–1.0)
Monocytes Relative: 6 %
Neutro Abs: 10.9 10*3/uL — ABNORMAL HIGH (ref 1.7–7.7)
Neutrophils Relative %: 78 %
Platelets: 308 10*3/uL (ref 150–400)
RBC: 5.69 MIL/uL (ref 4.22–5.81)
RDW: 13.5 % (ref 11.5–15.5)
WBC: 13.8 10*3/uL — ABNORMAL HIGH (ref 4.0–10.5)
nRBC: 0 % (ref 0.0–0.2)

## 2020-09-06 LAB — BASIC METABOLIC PANEL
Anion gap: 12 (ref 5–15)
BUN: 22 mg/dL — ABNORMAL HIGH (ref 6–20)
CO2: 21 mmol/L — ABNORMAL LOW (ref 22–32)
Calcium: 10.1 mg/dL (ref 8.9–10.3)
Chloride: 103 mmol/L (ref 98–111)
Creatinine, Ser: 0.93 mg/dL (ref 0.61–1.24)
GFR, Estimated: >60 mL/min (ref >60)
Glucose, Bld: 108 mg/dL — ABNORMAL HIGH (ref 70–99)
Potassium: 4.4 mmol/L (ref 3.5–5.1)
Sodium: 136 mmol/L (ref 135–145)

## 2020-09-06 LAB — RESP PANEL BY RT-PCR (FLU A&B, COVID) ARPGX2
Influenza A by PCR: NEGATIVE
Influenza B by PCR: NEGATIVE
SARS Coronavirus 2 by RT PCR: NEGATIVE

## 2020-09-06 MED ORDER — ACETAMINOPHEN 650 MG RE SUPP: 650.0000 mg | Freq: Four times a day (QID) | RECTAL | Status: DC | PRN

## 2020-09-06 MED ORDER — METHOCARBAMOL 1000 MG/10ML IJ SOLN
500.0000 mg | Freq: Four times a day (QID) | INTRAVENOUS | Status: DC | PRN
  Filled 2020-09-06 (×2): qty 5

## 2020-09-06 MED ORDER — HYDROMORPHONE HCL 1 MG/ML IJ SOLN: 0.5000 mg | INTRAMUSCULAR | Status: DC | PRN

## 2020-09-06 MED ORDER — POLYETHYLENE GLYCOL 3350 17 G PO PACK: 17.0000 g | PACK | Freq: Every day | ORAL | Status: DC | PRN

## 2020-09-06 MED ORDER — LEVOTHYROXINE SODIUM 75 MCG PO TABS
75.0000 ug | ORAL_TABLET | ORAL | Status: DC
  Administered 2020-09-07 – 2020-09-08 (×2): 75 ug via ORAL
  Filled 2020-09-06 (×2): qty 1

## 2020-09-06 MED ORDER — ONDANSETRON HCL 4 MG/2ML IJ SOLN: 4.0000 mg | Freq: Four times a day (QID) | INTRAMUSCULAR | Status: DC | PRN

## 2020-09-06 MED ORDER — SERTRALINE HCL 50 MG PO TABS
150.0000 mg | ORAL_TABLET | Freq: Every day | ORAL | Status: DC
  Administered 2020-09-07 (×2): 150 mg via ORAL
  Filled 2020-09-06 (×2): qty 3
  Filled 2020-09-06: qty 1

## 2020-09-06 MED ORDER — HYDROMORPHONE HCL 1 MG/ML IJ SOLN
1.0000 mg | Freq: Once | INTRAMUSCULAR | Status: AC
  Administered 2020-09-06: 1 mg via INTRAVENOUS
  Filled 2020-09-06: qty 1

## 2020-09-06 MED ORDER — DEXAMETHASONE SODIUM PHOSPHATE 4 MG/ML IJ SOLN
4.0000 mg | Freq: Three times a day (TID) | INTRAMUSCULAR | Status: DC
  Administered 2020-09-06 – 2020-09-07 (×2): 4 mg via INTRAVENOUS
  Filled 2020-09-06 (×4): qty 1

## 2020-09-06 MED ORDER — LACTATED RINGERS IV SOLN: INTRAVENOUS | Status: DC

## 2020-09-06 MED ORDER — BUPROPION HCL ER (XL) 150 MG PO TB24: 450.0000 mg | ORAL_TABLET | Freq: Every day | ORAL | Status: DC

## 2020-09-06 MED ORDER — ACETAMINOPHEN 325 MG PO TABS: 650.0000 mg | ORAL_TABLET | Freq: Four times a day (QID) | ORAL | Status: DC | PRN

## 2020-09-06 MED ORDER — DEXAMETHASONE SODIUM PHOSPHATE 10 MG/ML IJ SOLN
10.0000 mg | Freq: Once | INTRAMUSCULAR | Status: AC
  Administered 2020-09-06: 10 mg via INTRAVENOUS
  Filled 2020-09-06: qty 1

## 2020-09-06 MED ORDER — CLONAZEPAM 0.5 MG PO TABS
2.0000 mg | ORAL_TABLET | Freq: Every day | ORAL | Status: DC
  Administered 2020-09-07 (×2): 2 mg via ORAL
  Filled 2020-09-06 (×2): qty 4

## 2020-09-06 MED ORDER — LEVOTHYROXINE SODIUM 50 MCG PO TABS: 75.0000 ug | ORAL_TABLET | Freq: Every day | ORAL | Status: DC

## 2020-09-06 MED ORDER — LEVOTHYROXINE SODIUM 112 MCG PO TABS: 112.0000 ug | ORAL_TABLET | ORAL | Status: DC

## 2020-09-06 NOTE — ED Notes (Signed)
Bladder scanner shows . Pt aware that we need urine sample and states he will give me the sample as soon as possible.

## 2020-09-06 NOTE — ED Notes (Signed)
Call to bed management no [facility] beds available.

## 2020-09-06 NOTE — H&P (Incomplete)
History and Physical    Jon Beck PQZ:[id number] DOB: 11-04-1980 DOA: 09/06/2020  PCP: Mila Palmer, MD   Patient coming from: Home   Chief Complaint: Severe left leg pain   HPI: Jon Beck is a 40 y.o. male with medical history significant for depression, ADHD, and hypothyroidism, presenting to the emergency department for evaluation of severe left leg pain.  Patient reports approximately 2 weeks of low back pain.  He was seen in the ED on 08/26/2020 with severe pain radiating to the right hip and groin  ED Course: Upon arrival to the ED, patient is found to be ***  Review of Systems:  All other systems reviewed and apart from HPI, are negative.  Past Medical History:  Diagnosis Date  . CES (cauda equina syndrome) (HCC)   . Hypertension     History reviewed. No pertinent surgical history.  Social History:   reports that he has never smoked. He has never used smokeless tobacco. He reports current alcohol use. He reports that he does not use drugs.  No Known Allergies  History reviewed. No pertinent family history.   Prior to Admission medications   Medication Sig Start Date End Date Taking? Authorizing Provider  amphetamine-dextroamphetamine (ADDERALL XR) 20 MG 24 hr capsule Take 2 capsules (40 mg total) by mouth every morning. 09/23/20 10/23/20 Yes Hisada, Barbee Cough, MD  buPROPion (WELLBUTRIN XL) 150 MG 24 hr tablet Take total of 450 mg daily (300 mg + 150 mg ) 09/24/20  Yes Hisada, Barbee Cough, MD  buPROPion (WELLBUTRIN XL) 300 MG 24 hr tablet Take total of 450 mg daily (300 mg + 150 mg ) 09/24/20  Yes Hisada, Reina, MD  clonazePAM (KLONOPIN) 2 MG tablet Take 1 tablet (2 mg total) by mouth at bedtime. 08/07/20  Yes [name], MD  HYDROcodone-acetaminophen (NORCO/VICODIN) 5-325 MG tablet Take 1 tablet by mouth every 6 (six) hours as needed. 08/26/20  Yes Venter, Margaux, PA-C  ibuprofen (ADVIL,MOTRIN) 200 MG tablet Take 200 mg by mouth 3 (three) times daily as needed.   Yes  [provider]  levothyroxine (SYNTHROID, LEVOTHROID) 75 MCG tablet Take 75 mcg by mouth daily before breakfast. 1 whole tablet (weekdays Mon-Fri) 1.5 tabs on weekends (Sat-Sun)   Yes [provider]  predniSONE (STERAPRED UNI-PAK 21 TAB) 10 MG (21) TBPK tablet Take by mouth. 6 day taper , 21 qty 6-5-4-3-2-1 09/04/20  Yes [provider]  sertraline (ZOLOFT) 100 MG tablet Take 1.5 tablets (150 mg total) by mouth at bedtime. 09/24/20  Yes Hisada, Barbee Cough, MD  traMADol (ULTRAM) 50 MG tablet Take 50 mg by mouth 3 (three) times daily as needed. 09/01/20  Yes [provider]  amphetamine-dextroamphetamine (ADDERALL XR) 20 MG 24 hr capsule Take 2 capsules (40 mg total) by mouth every morning. 07/25/20 08/24/20  [name], MD  amphetamine-dextroamphetamine (ADDERALL XR) 20 MG 24 hr capsule Take 2 capsules (40 mg total) by mouth every morning. 08/24/20 09/23/20  [name], MD  methocarbamol (ROBAXIN) 500 MG tablet Take 1 tablet (500 mg total) by mouth 2 (two) times daily. 08/26/20   Tanda Rockers, PA-C    Physical Exam: Vitals:   09/06/20 1646 09/06/20 1922 09/06/20 2155 09/06/20 2254  BP:  127/90 (!) 148/100 (!) 160/105  Pulse: 90 88 97 84  Resp:  20 19   Temp:   98.5 F (36.9 C) 98.3 F (36.8 C)  TempSrc:   Oral   SpO2:  99% 97% 96%  Weight:      Height:  Constitutional: NAD, calm  Eyes: PERTLA, lids and conjunctivae normal ENMT: Mucous membranes are moist. Posterior pharynx clear of any exudate or lesions.   Neck: normal, supple, no masses, no thyromegaly Respiratory: clear to auscultation bilaterally, no wheezing, no crackles. No accessory muscle use.  Cardiovascular: S1 & S2 heard, regular rate and rhythm. No extremity edema. No significant JVD. Abdomen: No distension, no tenderness, soft. Bowel sounds active.  Musculoskeletal: no clubbing / cyanosis. No joint deformity upper and lower extremities.   Skin: no significant rashes, lesions, ulcers.  Warm, dry, well-perfused. Neurologic: CN 2-12 grossly intact. Sensation intact, DTR normal. Strength 5/5 in all 4 limbs.  Psychiatric: Alert and oriented to person, place, and situation. Pleasant and cooperative.    Labs and Imaging on Admission: I have personally reviewed following labs and imaging studies  CBC: Recent Labs  Lab 09/06/20 1742  WBC 13.8*  NEUTROABS 10.9*  HGB 15.8  HCT 48.9  MCV 85.9  PLT 308   Basic Metabolic Panel: Recent Labs  Lab 09/06/20 1742  NA 136  K 4.4  CL 103  CO2 21*  GLUCOSE 108*  BUN 22*  CREATININE 0.93  CALCIUM 10.1   GFR: Estimated Creatinine Clearance: 136.5 mL/min (by C-G formula based on SCr of 0.93 mg/dL). Liver Function Tests: No results for input(s): AST, ALT, ALKPHOS, BILITOT, PROT, ALBUMIN in the last 168 hours. No results for input(s): LIPASE, AMYLASE in the last 168 hours. No results for input(s): AMMONIA in the last 168 hours. Coagulation Profile: No results for input(s): INR, PROTIME in the last 168 hours. Cardiac Enzymes: No results for input(s): CKTOTAL, CKMB, CKMBINDEX, TROPONINI in the last 168 hours. BNP (last 3 results) No results for input(s): PROBNP in the last 8760 hours. HbA1C: No results for input(s): HGBA1C in the last 72 hours. CBG: No results for input(s): GLUCAP in the last 168 hours. Lipid Profile: No results for input(s): CHOL, HDL, LDLCALC, TRIG, CHOLHDL, LDLDIRECT in the last 72 hours. Thyroid Function Tests: No results for input(s): TSH, T4TOTAL, FREET4, T3FREE, THYROIDAB in the last 72 hours. Anemia Panel: No results for input(s): VITAMINB12, FOLATE, FERRITIN, TIBC, IRON, RETICCTPCT in the last 72 hours. Urine analysis:    Component Value Date/Time   COLORURINE YELLOW 09/06/2020 1820   APPEARANCEUR CLEAR 09/06/2020 1820   LABSPEC 1.021 09/06/2020 1820   PHURINE 6.0 09/06/2020 1820   GLUCOSEU NEGATIVE 09/06/2020 1820   HGBUR NEGATIVE 09/06/2020 1820   BILIRUBINUR NEGATIVE 09/06/2020 1820    KETONESUR NEGATIVE 09/06/2020 1820   PROTEINUR NEGATIVE 09/06/2020 1820   UROBILINOGEN 0.2 07/08/2013 1329   NITRITE NEGATIVE 09/06/2020 1820   LEUKOCYTESUR NEGATIVE 09/06/2020 1820   Sepsis Labs: @LABRCNTIP (procalcitonin:4,lacticidven:4) ) Recent Results (from the past 240 hour(s))  Resp Panel by RT-PCR (Flu A&B, Covid) Nasopharyngeal Swab     Status: None   Collection Time: 09/06/20  9:49 PM   Specimen: Nasopharyngeal Swab; Nasopharyngeal(NP) swabs in vial transport medium  Result Value Ref Range Status   SARS Coronavirus 2 by RT PCR NEGATIVE NEGATIVE Final    Comment: (NOTE) SARS-CoV-2 target nucleic acids are NOT DETECTED.  The SARS-CoV-2 RNA is generally detectable in upper respiratory specimens during the acute phase of infection. The lowest concentration of SARS-CoV-2 viral copies this assay can detect is 138 copies/mL. A negative result does not preclude SARS-Cov-2 infection and should not be used as the sole basis for treatment or other patient management decisions. A negative result may occur with  improper specimen collection/handling, submission of specimen other than  nasopharyngeal swab, presence of viral mutation(s) within the areas targeted by this assay, and inadequate number of viral copies(<138 copies/mL). A negative result must be combined with clinical observations, patient history, and epidemiological information. The expected result is Negative.  Fact Sheet for Patients:  BloggerCourse.comhttps://www.fda.gov/media/152166/download  Fact Sheet for Healthcare Providers:  SeriousBroker.ithttps://www.fda.gov/media/152162/download  This test is no t yet approved or cleared by the [place] FDA and  has been authorized for detection and/or diagnosis of SARS-CoV-2 by FDA under an Emergency Use Authorization (EUA). This EUA will remain  in effect (meaning this test can be used) for the duration of the COVID-19 declaration under Section 564(b)(1) of the Act, 21 U.S.C.section 360bbb-3(b)(1),  unless the authorization is terminated  or revoked sooner.       Influenza A by PCR NEGATIVE NEGATIVE Final   Influenza B by PCR NEGATIVE NEGATIVE Final    Comment: (NOTE) The Xpert Xpress SARS-CoV-2/FLU/RSV plus assay is intended as an aid in the diagnosis of influenza from Nasopharyngeal swab specimens and should not be used as a sole basis for treatment. Nasal washings and aspirates are unacceptable for Xpert Xpress SARS-CoV-2/FLU/RSV testing.  Fact Sheet for Patients: BloggerCourse.comhttps://www.fda.gov/media/152166/download  Fact Sheet for Healthcare Providers: SeriousBroker.ithttps://www.fda.gov/media/152162/download  This test is not yet approved or cleared by the [place] FDA and has been authorized for detection and/or diagnosis of SARS-CoV-2 by FDA under an Emergency Use Authorization (EUA). This EUA will remain in effect (meaning this test can be used) for the duration of the COVID-19 declaration under Section 564(b)(1) of the Act, 21 U.S.C. section 360bbb-3(b)(1), unless the authorization is terminated or revoked.  Performed at [facility], [address] [place], [place] [address]      Radiological Exams on Admission: MR LUMBAR SPINE WO CONTRAST  Result Date: 09/06/2020 CLINICAL DATA:  Low back pain EXAM: MRI LUMBAR SPINE WITHOUT CONTRAST TECHNIQUE: Multiplanar, multisequence MR imaging of the lumbar spine was performed. No intravenous contrast was administered. COMPARISON:  None. FINDINGS: Segmentation:  Standard. Alignment:  Physiologic. Vertebrae:  No fracture, evidence of discitis, or bone lesion. Conus medullaris and cauda equina: Conus extends to the T12-L1 level. Paraspinal and other soft tissues: Negative Disc levels: L1-L2: Normal disc space and facet joints. No spinal canal stenosis. No neural foraminal stenosis. L2-L3: Normal disc space and facet joints. No spinal canal stenosis. No neural foraminal stenosis. L3-L4: Small left subarticular disc protrusion. Left  lateral recess narrowing without central spinal canal stenosis. No neural foraminal stenosis. L4-L5: Large central disc extrusion with superior migration to the pedicle level. Severe spinal canal stenosis. No neural foraminal stenosis. L5-S1: Disc space narrowing. No spinal canal stenosis. No neural foraminal stenosis. Visualized sacrum: Normal. IMPRESSION: 1. Large central disc extrusion with superior migration at L4-L5 causing severe spinal canal stenosis with impingement of the cauda equina. 2. Small left subarticular disc protrusion at L3-L4 narrowing the left lateral recess. Correlate for left L4 radiculopathy. Electronically Signed   By: Deatra RobinsonKevin  Herman M.D.   On: 09/06/2020 21:34    Assessment/Plan   1. Lumbar spinal stenosis  - Presents with 2 weeks of severe low back pain with radiation to LEs  - MRI with disc extrusion at L4-L5 causing severe canal stenosis with cauda equina impingement  - He reports transient LLE weakness earlier today, intermittent paraesthesias, but no bowel or bladder dysfunction and is neurologically intact at time of admission  - Neurosurgery recommending medical admission, Decadron, NPO at midnight, and [facility]  -  Continue Decadron, NPO at midnight, neuro checks, pain-control    2. Depression, anxiety, ADHD  - Continue Wellbutrin, Zoloft, Klonopin    3. Hypothyroidism  - Continue Synthroid    DVT prophylaxis: SCDs  Code Status: Full  Level of Care: Level of care: Med-Surg Family Communication: None present Disposition Plan:  Patient is from: Home  Anticipated d/c is to: TBD Anticipated d/c date is: TBD based on NSG plan  Patient currently: Pending neurosurgery consultation  Consults called: Neurosurgery  Admission status: Inpatient     Briscoe Deutscher, MD [facility]  09/06/2020, 11:45 PM

## 2020-09-06 NOTE — ED Provider Notes (Signed)
[facility]-EMERGENCY DEPT Provider Note   CSN: [id number] Arrival date & time: 09/06/20  1631     History Chief Complaint  Patient presents with  . Left Leg Pain    Jon Beck is a 40 y.o. male.  Jon Beck is a 40 y.o. male with a history of hypertension, who presents to the emergency department for evaluation of severe pain extending from his hip into his left leg.  He reports he has been unable to walk and with any movement his leg seems to lock up and he has been having intermittent paresthesias.  He reports he was seen in the ED on 5/7 at that time was having pain in his hip, back and groin, had reassuring work-up with CT, scrotal ultrasound in the ED and was discharged home.  Pain continued and he followed up with his PCP on 5/13 at that time pain had moved from the right side into his left leg.  PCP expressed concern for cauda equina syndrome but sent patient home in prescribed him tramadol with plans to set up an outpatient MRI.  He reports today pain in his left leg has become unbearable.  Since the seventh he has had extremely limited mobility and has been having intermittent paresthesias in both legs.  Has not had any weakness but reports sometimes with movement his whole leg locks up and he cannot walk.  Reports he has not been able to urinate since yesterday morning, thought he was dehydrated and has been trying to drink more water today.  Reports normal bowel movements and no incontinence.  No saddle anesthesia.  Reports he called his PCP started him on prednisone on Monday but he has not had any improvement.  Became very scared today with worsening pain and so came in.  Was told the earliest he can get an outpatient MRI was June.        Past Medical History:  Diagnosis Date  . CES (cauda equina syndrome) (HCC)   . Hypertension     Patient Active Problem List   Diagnosis Date Noted  . MDD (major depressive disorder), recurrent, in full  remission (HCC) 04/02/2019  . MDD (major depressive disorder), recurrent episode, moderate (HCC) 02/20/2017  . Attention deficit hyperactivity disorder (ADHD) 02/20/2017    History reviewed. No pertinent surgical history.     History reviewed. No pertinent family history.  Social History   Tobacco Use  . Smoking status: Never Smoker  . Smokeless tobacco: Never Used  Substance Use Topics  . Alcohol use: Yes    Comment: Socail   . Drug use: No    Home Medications Prior to Admission medications   Medication Sig Start Date End Date Taking? Authorizing Provider  amphetamine-dextroamphetamine (ADDERALL XR) 20 MG 24 hr capsule Take 2 capsules (40 mg total) by mouth every morning. 09/23/20 10/23/20 Yes Hisada, Barbee Cough, MD  buPROPion (WELLBUTRIN XL) 150 MG 24 hr tablet Take total of 450 mg daily (300 mg + 150 mg ) 09/24/20  Yes Hisada, Barbee Cough, MD  buPROPion (WELLBUTRIN XL) 300 MG 24 hr tablet Take total of 450 mg daily (300 mg + 150 mg ) 09/24/20  Yes Hisada, Reina, MD  clonazePAM (KLONOPIN) 2 MG tablet Take 1 tablet (2 mg total) by mouth at bedtime. 08/07/20  Yes [name], MD  HYDROcodone-acetaminophen (NORCO/VICODIN) 5-325 MG tablet Take 1 tablet by mouth every 6 (six) hours as needed. 08/26/20  Yes Venter, Margaux, PA-C  ibuprofen (ADVIL,MOTRIN) 200 MG tablet Take 200  mg by mouth 3 (three) times daily as needed.   Yes [provider]  levothyroxine (SYNTHROID, LEVOTHROID) 75 MCG tablet Take 75 mcg by mouth daily before breakfast. 1 whole tablet (weekdays Mon-Fri) 1.5 tabs on weekends (Sat-Sun)   Yes [provider]  predniSONE (STERAPRED UNI-PAK 21 TAB) 10 MG (21) TBPK tablet Take by mouth. 6 day taper , 21 qty 6-5-4-3-2-1 09/04/20  Yes [provider]  sertraline (ZOLOFT) 100 MG tablet Take 1.5 tablets (150 mg total) by mouth at bedtime. 09/24/20  Yes Hisada, Barbee Cougheina, MD  traMADol (ULTRAM) 50 MG tablet Take 50 mg by mouth 3 (three) times daily as needed. 09/01/20  Yes  [provider]  amphetamine-dextroamphetamine (ADDERALL XR) 20 MG 24 hr capsule Take 2 capsules (40 mg total) by mouth every morning. 07/25/20 08/24/20  Neysa HotterHisada, Reina, MD  amphetamine-dextroamphetamine (ADDERALL XR) 20 MG 24 hr capsule Take 2 capsules (40 mg total) by mouth every morning. 08/24/20 09/23/20  Neysa HotterHisada, Reina, MD  methocarbamol (ROBAXIN) 500 MG tablet Take 1 tablet (500 mg total) by mouth 2 (two) times daily. 08/26/20   Tanda RockersVenter, Margaux, PA-C    Allergies    Patient has no known allergies.  Review of Systems   Review of Systems  Constitutional: Negative for chills and fever.  HENT: Negative.   Respiratory: Negative for shortness of breath.   Cardiovascular: Negative for chest pain and leg swelling.  Gastrointestinal: Negative for abdominal pain, nausea and vomiting.  Genitourinary: Positive for difficulty urinating. Negative for dysuria, flank pain, frequency and hematuria.  Musculoskeletal: Positive for back pain and myalgias. Negative for arthralgias.  Skin: Negative for color change, rash and wound.  Neurological: Positive for numbness (Paresthesias). Negative for weakness.  All other systems reviewed and are negative.   Physical Exam Updated Vital Signs BP (!) 148/100   Pulse 97   Temp 98.5 F (36.9 C) (Oral)   Resp 19   Ht 5\' 11"  (1.803 m)   Wt 117.9 kg   SpO2 97%   BMI 36.26 kg/m   Physical Exam Vitals and nursing note reviewed.  Constitutional:      General: He is not in acute distress.    Appearance: Normal appearance. He is well-developed. He is obese. He is not ill-appearing or diaphoretic.  HENT:     Head: Normocephalic and atraumatic.  Eyes:     General:        Right eye: No discharge.        Left eye: No discharge.     Pupils: Pupils are equal, round, and reactive to light.  Cardiovascular:     Rate and Rhythm: Normal rate and regular rhythm.     Heart sounds: Normal heart sounds.  Pulmonary:     Effort: Pulmonary effort is normal. No  respiratory distress.     Breath sounds: Normal breath sounds. No wheezing or rales.     Comments: Respirations equal and unlabored, patient able to speak in full sentences, lungs clear to auscultation bilaterally  Abdominal:     General: Bowel sounds are normal. There is no distension.     Palpations: Abdomen is soft. There is no mass.     Tenderness: There is no abdominal tenderness. There is no guarding.  Musculoskeletal:        General: No deformity.     Cervical back: Neck supple.     Comments: Mild tenderness in left low back and leg, pain significantly worse with any movement  Skin:    General:  Skin is warm and dry.     Capillary Refill: Capillary refill takes less than 2 seconds.  Neurological:     Mental Status: He is alert.     Coordination: Coordination normal.     Comments: Speech is clear, able to follow commands CN III-XII intact Normal strength in upper and lower extremities bilaterally including dorsiflexion and plantar flexion, strong and equal grip strength Sensation normal to light and sharp touch Patellar DTRs 2+ Moves extremities without ataxia, coordination intact   Psychiatric:        Mood and Affect: Mood normal.        Behavior: Behavior normal.     ED Results / Procedures / Treatments   Labs (all labs ordered are listed, but only abnormal results are displayed) Labs Reviewed  BASIC METABOLIC PANEL - Abnormal; Notable for the following components:      Result Value   CO2 21 (*)    Glucose, Bld 108 (*)    BUN 22 (*)    All other components within normal limits  CBC WITH DIFFERENTIAL/PLATELET - Abnormal; Notable for the following components:   WBC 13.8 (*)    Neutro Abs 10.9 (*)    All other components within normal limits  RESP PANEL BY RT-PCR (FLU A&B, COVID) [id number]  URINALYSIS, ROUTINE W REFLEX MICROSCOPIC    EKG None  Radiology MR LUMBAR SPINE WO CONTRAST  Result Date: 09/06/2020 CLINICAL DATA:  Low back pain EXAM: MRI LUMBAR SPINE  WITHOUT CONTRAST TECHNIQUE: Multiplanar, multisequence MR imaging of the lumbar spine was performed. No intravenous contrast was administered. COMPARISON:  None. FINDINGS: Segmentation:  Standard. Alignment:  Physiologic. Vertebrae:  No fracture, evidence of discitis, or bone lesion. Conus medullaris and cauda equina: Conus extends to the T12-L1 level. Paraspinal and other soft tissues: Negative Disc levels: L1-L2: Normal disc space and facet joints. No spinal canal stenosis. No neural foraminal stenosis. L2-L3: Normal disc space and facet joints. No spinal canal stenosis. No neural foraminal stenosis. L3-L4: Small left subarticular disc protrusion. Left lateral recess narrowing without central spinal canal stenosis. No neural foraminal stenosis. L4-L5: Large central disc extrusion with superior migration to the pedicle level. Severe spinal canal stenosis. No neural foraminal stenosis. L5-S1: Disc space narrowing. No spinal canal stenosis. No neural foraminal stenosis. Visualized sacrum: Normal. IMPRESSION: 1. Large central disc extrusion with superior migration at L4-L5 causing severe spinal canal stenosis with impingement of the cauda equina. 2. Small left subarticular disc protrusion at L3-L4 narrowing the left lateral recess. Correlate for left L4 radiculopathy. Electronically Signed   By: Deatra Robinson M.D.   On: 09/06/2020 21:34    Procedures .Critical Care Performed by: Dartha Lodge, PA-C Authorized by: Dartha Lodge, PA-C   Critical care provider statement:    Critical care time (minutes):  45   Critical care was necessary to treat or prevent imminent or life-threatening deterioration of the following conditions:  CNS failure or compromise (Cauda equina syndrome)   Critical care was time spent personally by me on the following activities:  Discussions with consultants, evaluation of patient's response to treatment, examination of patient, ordering and performing treatments and interventions,  ordering and review of laboratory studies, ordering and review of radiographic studies, pulse oximetry, re-evaluation of patient's condition, obtaining history from patient or surrogate and review of old charts     Medications Ordered in ED Medications  dexamethasone (DECADRON) injection 4 mg (4 mg Intravenous Given 09/06/20 2212)  HYDROmorphone (DILAUDID) injection 1 mg (1  mg Intravenous Given 09/06/20 1744)  dexamethasone (DECADRON) injection 10 mg (10 mg Intravenous Given 09/06/20 1743)    ED Course  I have reviewed the triage vital signs and the nursing notes.  Pertinent labs & imaging results that were available during my care of the patient were reviewed by me and considered in my medical decision making (see chart for details).    MDM Rules/Calculators/A&P                         40 year old male presents with intermittent paresthesias in his legs, severe left leg pain was previously having right-sided low back and groin pain.  Also having urinary retention, no incontinence, no saddle anesthesia.  PCP told him he likely had cauda equina syndrome and he was sent home with plans to schedule outpatient MRI.  Symptoms worsened today.  When he is sitting still he is neurologically intact.  No focal deficits noted on exam.  Despite reported urinary retention bladder scan of 125 mL.  Will check urinalysis, basic labs and MRI of the lumbar spine.  Concern for potential cauda equina syndrome versus disc herniation, spinal stenosis or other acute neurologic process.  IV pain medication and steroid given for symptom management.  I have independently ordered, reviewed and interpreted all labs and imaging: CBC: Leukocytosis of 13.8, has been on 3 days of steroids, normal hemoglobin BMP: Glucose 108, no other significant electrolyte derangements, normal renal function UA: No hematuria, proteinuria or signs of infection  MRI consistent with cauda equina syndrome, large central disc extrusion at L4-5  causing severe spinal canal stenosis with impingement of the cauda equina.  This is a neurosurgical emergency.  Currently patient is neurologically intact with normal reflexes, will consult neurosurgery.  Case discussed with Dr. Maisie Fus with neurosurgery, he is reviewed patient's MRI.  Recommends patient be n.p.o. at midnight, Decadron 4 mg every 8 hours, request patient be admitted to medicine service at [facility] and he will see patient in the morning to discuss surgical options.  Case discussed with Dr. Antionette Char with [facility] who will see and admit patient to [facility]  Final Clinical Impression(s) / ED Diagnoses Final diagnoses:  Cauda equina compression [facility])  Lumbar disc herniation    Rx / DC Orders ED Discharge Orders    None       Legrand Rams 09/06/20 2248    Little, Ambrose Finland, MD 09/12/20 1737

## 2020-09-06 NOTE — ED Triage Notes (Signed)
Pt states his pain in left leg is very severe today. Pt has been unable to walk today. Pt seen 5/7 for back/side/groin pain and followed up with his PCP. PCP diagnosed pt with a nerve disorder-CES per pt.

## 2020-09-06 NOTE — H&P (Signed)
History and Physical    Markeis Allman ZHG:[id number] DOB: [birth year] DOA: 09/06/2020  PCP: Mila Palmer, MD   Patient coming from: Home   Chief Complaint: Severe left leg pain   HPI: Carmon Sahli is a 40 y.o. male with medical history significant for depression, ADHD, and hypothyroidism, presenting to the emergency department for evaluation of severe left leg pain.  Patient reports approximately 2 weeks of low back pain.  He was seen in the ED on 08/26/2020 with severe pain radiating to the right hip and groin, had negative CT renal stone study and negative scrotal ultrasound with Doppler and was discharged home with analgesics.  He followed up with his PCP on 09/01/2020, was started on prednisone, and referred for outpatient MRI that has not yet been performed.  He developed severe pain going into his left leg today, has had intermittent paresthesias involving the bilateral lower extremities, and had transient left leg weakness today that prompted his presentation.  He denies any saddle paresthesias, urinary or bowel incontinence, or urinary retention.   ED Course: Upon arrival to the ED, patient is found to be afebrile, saturating well on room air, and mildly hypertensive.  Chemistry panel is unremarkable and CBC notable for leukocytosis to 13,800.  MRI lumbar spine reveals large central disc extrusion with superior migration at L4-L5 causing severe spinal canal stenosis with impingement of cauda equina.  Patient was treated with Decadron and Dilaudid in the ED. Neurosurgery was consulted by the ED physician and medical admission to [facility] was recommended.  Review of Systems:  All other systems reviewed and apart from HPI, are negative.  Past Medical History:  Diagnosis Date  . CES (cauda equina syndrome) (HCC)   . Hypertension     History reviewed. No pertinent surgical history.  Social History:   reports that he has never smoked. He has never used smokeless tobacco. He reports  current alcohol use. He reports that he does not use drugs.  No Known Allergies  History reviewed. No pertinent family history.   Prior to Admission medications   Medication Sig Start Date End Date Taking? Authorizing Provider  amphetamine-dextroamphetamine (ADDERALL XR) 20 MG 24 hr capsule Take 2 capsules (40 mg total) by mouth every morning. 09/23/20 10/23/20 Yes Hisada, Barbee Cough, MD  buPROPion (WELLBUTRIN XL) 150 MG 24 hr tablet Take total of 450 mg daily (300 mg + 150 mg ) 09/24/20  Yes Hisada, Barbee Cough, MD  buPROPion (WELLBUTRIN XL) 300 MG 24 hr tablet Take total of 450 mg daily (300 mg + 150 mg ) 09/24/20  Yes Hisada, Reina, MD  clonazePAM (KLONOPIN) 2 MG tablet Take 1 tablet (2 mg total) by mouth at bedtime. 08/07/20  Yes [name], MD  HYDROcodone-acetaminophen (NORCO/VICODIN) 5-325 MG tablet Take 1 tablet by mouth every 6 (six) hours as needed. 08/26/20  Yes Venter, Margaux, PA-C  ibuprofen (ADVIL,MOTRIN) 200 MG tablet Take 200 mg by mouth 3 (three) times daily as needed.   Yes [provider]  levothyroxine (SYNTHROID, LEVOTHROID) 75 MCG tablet Take 75 mcg by mouth daily before breakfast. 1 whole tablet (weekdays Mon-Fri) 1.5 tabs on weekends (Sat-Sun)   Yes [provider]  predniSONE (STERAPRED UNI-PAK 21 TAB) 10 MG (21) TBPK tablet Take by mouth. 6 day taper , 21 qty 6-5-4-3-2-1 09/04/20  Yes [provider]  sertraline (ZOLOFT) 100 MG tablet Take 1.5 tablets (150 mg total) by mouth at bedtime. 09/24/20  Yes Hisada, Barbee Cough, MD  traMADol (ULTRAM) 50 MG tablet Take 50  mg by mouth 3 (three) times daily as needed. 09/01/20  Yes [provider]  amphetamine-dextroamphetamine (ADDERALL XR) 20 MG 24 hr capsule Take 2 capsules (40 mg total) by mouth every morning. 07/25/20 08/24/20  Neysa HotterHisada, Reina, MD  amphetamine-dextroamphetamine (ADDERALL XR) 20 MG 24 hr capsule Take 2 capsules (40 mg total) by mouth every morning. 08/24/20 09/23/20  Neysa HotterHisada, Reina, MD  methocarbamol (ROBAXIN)  500 MG tablet Take 1 tablet (500 mg total) by mouth 2 (two) times daily. 08/26/20   Tanda RockersVenter, Margaux, PA-C    Physical Exam: Vitals:   09/06/20 1646 09/06/20 1922 09/06/20 2155 09/06/20 2254  BP:  127/90 (!) 148/100 (!) 160/105  Pulse: 90 88 97 84  Resp:  20 19   Temp:   98.5 F (36.9 C) 98.3 F (36.8 C)  TempSrc:   Oral   SpO2:  99% 97% 96%  Weight:      Height:        Constitutional: NAD, calm  Eyes: PERTLA, lids and conjunctivae normal ENMT: Mucous membranes are moist. Posterior pharynx clear of any exudate or lesions.   Neck: supple, no masses  Respiratory:  no wheezing, no crackles. No accessory muscle use.  Cardiovascular: S1 & S2 heard, regular rate and rhythm. No extremity edema.  Abdomen: No distension, no tenderness, soft. Bowel sounds active.  Musculoskeletal: no clubbing / cyanosis. No joint deformity upper and lower extremities.   Skin: no significant rashes, lesions, ulcers. Warm, dry, well-perfused. Neurologic: CN 2-12 grossly intact. Sensation intact, patellar DTR normal. Strength 5/5 in all 4 limbs.  Psychiatric: Alert and oriented to person, place, and situation. Pleasant and cooperative.    Labs and Imaging on Admission: I have personally reviewed following labs and imaging studies  CBC: Recent Labs  Lab 09/06/20 1742  WBC 13.8*  NEUTROABS 10.9*  HGB 15.8  HCT 48.9  MCV 85.9  PLT 308   Basic Metabolic Panel: Recent Labs  Lab 09/06/20 1742  NA 136  K 4.4  CL 103  CO2 21*  GLUCOSE 108*  BUN 22*  CREATININE 0.93  CALCIUM 10.1   GFR: Estimated Creatinine Clearance: 136.5 mL/min (by C-G formula based on SCr of 0.93 mg/dL). Liver Function Tests: No results for input(s): AST, ALT, ALKPHOS, BILITOT, PROT, ALBUMIN in the last 168 hours. No results for input(s): LIPASE, AMYLASE in the last 168 hours. No results for input(s): AMMONIA in the last 168 hours. Coagulation Profile: No results for input(s): INR, PROTIME in the last 168 hours. Cardiac  Enzymes: No results for input(s): CKTOTAL, CKMB, CKMBINDEX, TROPONINI in the last 168 hours. BNP (last 3 results) No results for input(s): PROBNP in the last 8760 hours. HbA1C: No results for input(s): HGBA1C in the last 72 hours. CBG: No results for input(s): GLUCAP in the last 168 hours. Lipid Profile: No results for input(s): CHOL, HDL, LDLCALC, TRIG, CHOLHDL, LDLDIRECT in the last 72 hours. Thyroid Function Tests: No results for input(s): TSH, T4TOTAL, FREET4, T3FREE, THYROIDAB in the last 72 hours. Anemia Panel: No results for input(s): VITAMINB12, FOLATE, FERRITIN, TIBC, IRON, RETICCTPCT in the last 72 hours. Urine analysis:    Component Value Date/Time   COLORURINE YELLOW 09/06/2020 1820   APPEARANCEUR CLEAR 09/06/2020 1820   LABSPEC 1.021 09/06/2020 1820   PHURINE 6.0 09/06/2020 1820   GLUCOSEU NEGATIVE 09/06/2020 1820   HGBUR NEGATIVE 09/06/2020 1820   BILIRUBINUR NEGATIVE 09/06/2020 1820   KETONESUR NEGATIVE 09/06/2020 1820   PROTEINUR NEGATIVE 09/06/2020 1820   UROBILINOGEN 0.2 07/08/2013 1329  NITRITE NEGATIVE 09/06/2020 1820   LEUKOCYTESUR NEGATIVE 09/06/2020 1820   Sepsis Labs: @LABRCNTIP (procalcitonin:4,lacticidven:4) ) Recent Results (from the past 240 hour(s))  Resp Panel by RT-PCR (Flu A&B, Covid) Nasopharyngeal Swab     Status: None   Collection Time: 09/06/20  9:49 PM   Specimen: Nasopharyngeal Swab; Nasopharyngeal(NP) swabs in vial transport medium  Result Value Ref Range Status   SARS Coronavirus 2 by RT PCR NEGATIVE NEGATIVE Final    Comment: (NOTE) SARS-CoV-2 target nucleic acids are NOT DETECTED.  The SARS-CoV-2 RNA is generally detectable in upper respiratory specimens during the acute phase of infection. The lowest concentration of SARS-CoV-2 viral copies this assay can detect is 138 copies/mL. A negative result does not preclude SARS-Cov-2 infection and should not be used as the sole basis for treatment or other patient management decisions.  A negative result may occur with  improper specimen collection/handling, submission of specimen other than nasopharyngeal swab, presence of viral mutation(s) within the areas targeted by this assay, and inadequate number of viral copies(<138 copies/mL). A negative result must be combined with clinical observations, patient history, and epidemiological information. The expected result is Negative.  Fact Sheet for Patients:  09/08/20  Fact Sheet for Healthcare Providers:  BloggerCourse.com  This test is no t yet approved or cleared by the [employer] FDA and  has been authorized for detection and/or diagnosis of SARS-CoV-2 by FDA under an Emergency Use Authorization (EUA). This EUA will remain  in effect (meaning this test can be used) for the duration of the COVID-19 declaration under Section 564(b)(1) of the Act, 21 U.S.C.section 360bbb-3(b)(1), unless the authorization is terminated  or revoked sooner.       Influenza A by PCR NEGATIVE NEGATIVE Final   Influenza B by PCR NEGATIVE NEGATIVE Final    Comment: (NOTE) The Xpert Xpress SARS-CoV-2/FLU/RSV plus assay is intended as an aid in the diagnosis of influenza from Nasopharyngeal swab specimens and should not be used as a sole basis for treatment. Nasal washings and aspirates are unacceptable for Xpert Xpress SARS-CoV-2/FLU/RSV testing.  Fact Sheet for Patients: [place]  Fact Sheet for Healthcare Providers: BloggerCourse.com  This test is not yet approved or cleared by the [employer] FDA and has been authorized for detection and/or diagnosis of SARS-CoV-2 by FDA under an Emergency Use Authorization (EUA). This EUA will remain in effect (meaning this test can be used) for the duration of the COVID-19 declaration under Section 564(b)(1) of the Act, 21 U.S.C. section 360bbb-3(b)(1), unless the authorization  is terminated or revoked.  Performed at [facility], [address] [place], [place]      Radiological Exams on Admission: MR LUMBAR SPINE WO CONTRAST  Result Date: 09/06/2020 CLINICAL DATA:  Low back pain EXAM: MRI LUMBAR SPINE WITHOUT CONTRAST TECHNIQUE: Multiplanar, multisequence MR imaging of the lumbar spine was performed. No intravenous contrast was administered. COMPARISON:  None. FINDINGS: Segmentation:  Standard. Alignment:  Physiologic. Vertebrae:  No fracture, evidence of discitis, or bone lesion. Conus medullaris and cauda equina: Conus extends to the T12-L1 level. Paraspinal and other soft tissues: Negative Disc levels: L1-L2: Normal disc space and facet joints. No spinal canal stenosis. No neural foraminal stenosis. L2-L3: Normal disc space and facet joints. No spinal canal stenosis. No neural foraminal stenosis. L3-L4: Small left subarticular disc protrusion. Left lateral recess narrowing without central spinal canal stenosis. No neural foraminal stenosis. L4-L5: Large central disc extrusion with superior migration to the pedicle level. Severe spinal canal stenosis. No  neural foraminal stenosis. L5-S1: Disc space narrowing. No spinal canal stenosis. No neural foraminal stenosis. Visualized sacrum: Normal. IMPRESSION: 1. Large central disc extrusion with superior migration at L4-L5 causing severe spinal canal stenosis with impingement of the cauda equina. 2. Small left subarticular disc protrusion at L3-L4 narrowing the left lateral recess. Correlate for left L4 radiculopathy. Electronically Signed   By: Deatra Robinson M.D.   On: 09/06/2020 21:34    Assessment/Plan   1. Lumbar spinal stenosis  - Presents with 2 weeks of severe low back pain with radiation to LEs  - MRI with disc extrusion at L4-L5 causing severe canal stenosis with cauda equina impingement  - He reports transient LLE weakness earlier today, intermittent paraesthesias, but no bowel or  bladder dysfunction and is neurologically intact at time of admission  - Neurosurgery recommending medical admission, Decadron, NPO at midnight, and plans to see in am at [facility]  - Continue Decadron, NPO at midnight, neuro checks, pain-control    2. Depression, anxiety, ADHD  - Continue Wellbutrin, Zoloft, Klonopin    3. Hypothyroidism  - Continue Synthroid    DVT prophylaxis: SCDs  Code Status: Full  Level of Care: Level of care: Med-Surg Family Communication: None present Disposition Plan:  Patient is from: Home  Anticipated d/c is to: TBD Anticipated d/c date is: TBD based on NSG plan  Patient currently: Pending neurosurgery consultation  Consults called: Neurosurgery  Admission status: Inpatient     Briscoe Deutscher, MD [facility]  09/06/2020, 11:45 PM

## 2020-09-07 ENCOUNTER — Encounter (HOSPITAL_COMMUNITY): Payer: Self-pay | Admitting: Family Medicine

## 2020-09-07 ENCOUNTER — Inpatient Hospital Stay (HOSPITAL_COMMUNITY): Payer: No Typology Code available for payment source | Admitting: Anesthesiology

## 2020-09-07 ENCOUNTER — Inpatient Hospital Stay (HOSPITAL_COMMUNITY): Payer: No Typology Code available for payment source

## 2020-09-07 ENCOUNTER — Encounter (HOSPITAL_COMMUNITY)
Admission: EM | Disposition: A | Discharge status: Home / Self Care | Payer: Self-pay | Source: Home / Self Care | Attending: Internal Medicine

## 2020-09-07 DIAGNOSIS — M48062 Spinal stenosis, lumbar region with neurogenic claudication: Secondary | ICD-10-CM | POA: Diagnosis not present

## 2020-09-07 DIAGNOSIS — M5126 Other intervertebral disc displacement, lumbar region: Secondary | ICD-10-CM | POA: Insufficient documentation

## 2020-09-07 DIAGNOSIS — G834 Cauda equina syndrome: Secondary | ICD-10-CM | POA: Insufficient documentation

## 2020-09-07 HISTORY — PX: LUMBAR LAMINECTOMY/DECOMPRESSION MICRODISCECTOMY: SHX5026

## 2020-09-07 LAB — BASIC METABOLIC PANEL
Anion gap: 7 (ref 5–15)
BUN: 23 mg/dL — ABNORMAL HIGH (ref 6–20)
CO2: 28 mmol/L (ref 22–32)
Calcium: 9.9 mg/dL (ref 8.9–10.3)
Chloride: 102 mmol/L (ref 98–111)
Creatinine, Ser: 1.14 mg/dL (ref 0.61–1.24)
GFR, Estimated: >60 mL/min (ref >60)
Glucose, Bld: 141 mg/dL — ABNORMAL HIGH (ref 70–99)
Potassium: 4.4 mmol/L (ref 3.5–5.1)
Sodium: 137 mmol/L (ref 135–145)

## 2020-09-07 LAB — CBC
HCT: 45.2 % (ref 39.0–52.0)
Hemoglobin: 15 g/dL (ref 13.0–17.0)
MCH: 28.4 pg (ref 26.0–34.0)
MCHC: 33.2 g/dL (ref 30.0–36.0)
MCV: 85.6 fL (ref 80.0–100.0)
Platelets: 280 10*3/uL (ref 150–400)
RBC: 5.28 MIL/uL (ref 4.22–5.81)
RDW: 13.4 % (ref 11.5–15.5)
WBC: 14.6 10*3/uL — ABNORMAL HIGH (ref 4.0–10.5)
nRBC: 0 % (ref 0.0–0.2)

## 2020-09-07 LAB — MRSA PCR SCREENING: MRSA by PCR: NEGATIVE

## 2020-09-07 LAB — HIV ANTIBODY (ROUTINE TESTING W REFLEX): HIV Screen 4th Generation wRfx: NONREACTIVE

## 2020-09-07 SURGERY — LUMBAR LAMINECTOMY/DECOMPRESSION MICRODISCECTOMY 1 LEVEL
Anesthesia: General | Laterality: Right

## 2020-09-07 MED ORDER — PROPOFOL 10 MG/ML IV BOLUS
INTRAVENOUS | Status: AC
  Filled 2020-09-07: qty 20

## 2020-09-07 MED ORDER — SODIUM CHLORIDE 0.9% FLUSH
3.0000 mL | Freq: Two times a day (BID) | INTRAVENOUS | Status: DC
  Administered 2020-09-07: 3 mL via INTRAVENOUS

## 2020-09-07 MED ORDER — SODIUM CHLORIDE 0.9 % IV SOLN: INTRAVENOUS | Status: DC | PRN

## 2020-09-07 MED ORDER — POTASSIUM CHLORIDE IN NACL 20-0.9 MEQ/L-% IV SOLN
INTRAVENOUS | Status: DC
  Filled 2020-09-07: qty 1000

## 2020-09-07 MED ORDER — PROPOFOL 10 MG/ML IV BOLUS
INTRAVENOUS | Status: DC | PRN
  Administered 2020-09-07: 200 mg via INTRAVENOUS

## 2020-09-07 MED ORDER — LIDOCAINE-EPINEPHRINE 1 %-1:100000 IJ SOLN
INTRAMUSCULAR | Status: AC
  Filled 2020-09-07: qty 1

## 2020-09-07 MED ORDER — LACTATED RINGERS IV SOLN: INTRAVENOUS | Status: DC

## 2020-09-07 MED ORDER — ACETAMINOPHEN 650 MG RE SUPP: 650.0000 mg | RECTAL | Status: DC | PRN

## 2020-09-07 MED ORDER — BUPIVACAINE HCL (PF) 0.5 % IJ SOLN
INTRAMUSCULAR | Status: AC
  Filled 2020-09-07: qty 30

## 2020-09-07 MED ORDER — CEFAZOLIN SODIUM-DEXTROSE 1-4 GM/50ML-% IV SOLN: 1.0000 g | Freq: Three times a day (TID) | INTRAVENOUS | Status: DC

## 2020-09-07 MED ORDER — ONDANSETRON HCL 4 MG/2ML IJ SOLN: 4.0000 mg | Freq: Four times a day (QID) | INTRAMUSCULAR | Status: DC | PRN

## 2020-09-07 MED ORDER — SODIUM CHLORIDE 0.9% FLUSH: 3.0000 mL | INTRAVENOUS | Status: DC | PRN

## 2020-09-07 MED ORDER — ROCURONIUM BROMIDE 10 MG/ML (PF) SYRINGE
PREFILLED_SYRINGE | INTRAVENOUS | Status: AC
  Filled 2020-09-07: qty 10

## 2020-09-07 MED ORDER — ONDANSETRON HCL 4 MG PO TABS: 4.0000 mg | ORAL_TABLET | Freq: Four times a day (QID) | ORAL | Status: DC | PRN

## 2020-09-07 MED ORDER — SODIUM CHLORIDE 0.9 % IV SOLN: 250.0000 mL | INTRAVENOUS | Status: DC

## 2020-09-07 MED ORDER — PHENOL 1.4 % MT LIQD
1.0000 | OROMUCOSAL | Status: DC | PRN
Start: 2020-09-07 — End: 2020-09-08

## 2020-09-07 MED ORDER — 0.9 % SODIUM CHLORIDE (POUR BTL) OPTIME
TOPICAL | Status: DC | PRN
  Administered 2020-09-07: 1000 mL

## 2020-09-07 MED ORDER — HYDROCODONE-ACETAMINOPHEN 5-325 MG PO TABS
1.0000 | ORAL_TABLET | ORAL | Status: DC | PRN
Start: 2020-09-07 — End: 2020-09-08

## 2020-09-07 MED ORDER — SUGAMMADEX SODIUM 200 MG/2ML IV SOLN
INTRAVENOUS | Status: DC | PRN
  Administered 2020-09-07: 200 mg via INTRAVENOUS

## 2020-09-07 MED ORDER — CHLORHEXIDINE GLUCONATE 0.12 % MT SOLN
OROMUCOSAL | Status: AC
  Administered 2020-09-07: 15 mL via OROMUCOSAL
  Filled 2020-09-07: qty 15

## 2020-09-07 MED ORDER — EPHEDRINE 5 MG/ML INJ
INTRAVENOUS | Status: AC
  Filled 2020-09-07: qty 10

## 2020-09-07 MED ORDER — DOCUSATE SODIUM 100 MG PO CAPS
100.0000 mg | ORAL_CAPSULE | Freq: Two times a day (BID) | ORAL | Status: DC
  Administered 2020-09-07 (×2): 100 mg via ORAL
  Filled 2020-09-07 (×2): qty 1

## 2020-09-07 MED ORDER — MENTHOL 3 MG MT LOZG: 1.0000 | LOZENGE | OROMUCOSAL | Status: DC | PRN

## 2020-09-07 MED ORDER — PHENYLEPHRINE HCL-NACL 10-0.9 MG/250ML-% IV SOLN
INTRAVENOUS | Status: DC | PRN
  Administered 2020-09-07: 50 ug/min via INTRAVENOUS

## 2020-09-07 MED ORDER — MIDAZOLAM HCL 2 MG/2ML IJ SOLN
INTRAMUSCULAR | Status: AC
  Filled 2020-09-07: qty 2

## 2020-09-07 MED ORDER — ONDANSETRON HCL 4 MG/2ML IJ SOLN
INTRAMUSCULAR | Status: DC | PRN
  Administered 2020-09-07: 4 mg via INTRAVENOUS

## 2020-09-07 MED ORDER — LIDOCAINE 2% (20 MG/ML) 5 ML SYRINGE
INTRAMUSCULAR | Status: DC | PRN
  Administered 2020-09-07: 100 mg via INTRAVENOUS

## 2020-09-07 MED ORDER — BUPIVACAINE HCL (PF) 0.5 % IJ SOLN
INTRAMUSCULAR | Status: DC | PRN
  Administered 2020-09-07: 4.5 mL

## 2020-09-07 MED ORDER — CYCLOBENZAPRINE HCL 10 MG PO TABS
10.0000 mg | ORAL_TABLET | Freq: Three times a day (TID) | ORAL | Status: DC | PRN
  Administered 2020-09-07: 10 mg via ORAL

## 2020-09-07 MED ORDER — FENTANYL CITRATE (PF) 250 MCG/5ML IJ SOLN
INTRAMUSCULAR | Status: DC | PRN
  Administered 2020-09-07: 50 ug via INTRAVENOUS
  Administered 2020-09-07: 100 ug via INTRAVENOUS

## 2020-09-07 MED ORDER — DEXAMETHASONE SODIUM PHOSPHATE 10 MG/ML IJ SOLN
INTRAMUSCULAR | Status: DC | PRN
  Administered 2020-09-07: 10 mg via INTRAVENOUS

## 2020-09-07 MED ORDER — CYCLOBENZAPRINE HCL 10 MG PO TABS
ORAL_TABLET | ORAL | Status: AC
  Filled 2020-09-07: qty 1

## 2020-09-07 MED ORDER — EPHEDRINE SULFATE-NACL 50-0.9 MG/10ML-% IV SOSY
PREFILLED_SYRINGE | INTRAVENOUS | Status: DC | PRN
  Administered 2020-09-07 (×2): 5 mg via INTRAVENOUS

## 2020-09-07 MED ORDER — PHENYLEPHRINE 40 MCG/ML (10ML) SYRINGE FOR IV PUSH (FOR BLOOD PRESSURE SUPPORT)
PREFILLED_SYRINGE | INTRAVENOUS | Status: AC
  Filled 2020-09-07: qty 10

## 2020-09-07 MED ORDER — ROCURONIUM BROMIDE 10 MG/ML (PF) SYRINGE
PREFILLED_SYRINGE | INTRAVENOUS | Status: DC | PRN
  Administered 2020-09-07: 40 mg via INTRAVENOUS
  Administered 2020-09-07: 60 mg via INTRAVENOUS

## 2020-09-07 MED ORDER — PHENYLEPHRINE 40 MCG/ML (10ML) SYRINGE FOR IV PUSH (FOR BLOOD PRESSURE SUPPORT)
PREFILLED_SYRINGE | INTRAVENOUS | Status: DC | PRN
  Administered 2020-09-07: 40 ug via INTRAVENOUS
  Administered 2020-09-07 (×3): 80 ug via INTRAVENOUS

## 2020-09-07 MED ORDER — METHYLPREDNISOLONE ACETATE 80 MG/ML IJ SUSP
INTRAMUSCULAR | Status: AC
  Filled 2020-09-07: qty 1

## 2020-09-07 MED ORDER — LIDOCAINE 2% (20 MG/ML) 5 ML SYRINGE
INTRAMUSCULAR | Status: AC
  Filled 2020-09-07: qty 5

## 2020-09-07 MED ORDER — ORAL CARE MOUTH RINSE: 15.0000 mL | Freq: Once | OROMUCOSAL | Status: AC

## 2020-09-07 MED ORDER — THROMBIN 5000 UNITS EX SOLR
OROMUCOSAL | Status: DC | PRN
  Administered 2020-09-07: 5 mL via TOPICAL

## 2020-09-07 MED ORDER — THROMBIN 5000 UNITS EX SOLR
CUTANEOUS | Status: AC
  Filled 2020-09-07: qty 15000

## 2020-09-07 MED ORDER — THROMBIN 5000 UNITS EX SOLR
CUTANEOUS | Status: DC | PRN
  Administered 2020-09-07 (×2): 5000 [IU] via TOPICAL

## 2020-09-07 MED ORDER — ACETAMINOPHEN 325 MG PO TABS: 650.0000 mg | ORAL_TABLET | ORAL | Status: DC | PRN

## 2020-09-07 MED ORDER — MIDAZOLAM HCL 2 MG/2ML IJ SOLN
INTRAMUSCULAR | Status: DC | PRN
  Administered 2020-09-07: 2 mg via INTRAVENOUS

## 2020-09-07 MED ORDER — METHYLPREDNISOLONE ACETATE 80 MG/ML IJ SUSP
INTRAMUSCULAR | Status: DC | PRN
  Administered 2020-09-07: 40 mg

## 2020-09-07 MED ORDER — HYDROMORPHONE HCL 1 MG/ML IJ SOLN
0.2500 mg | INTRAMUSCULAR | Status: DC | PRN
  Administered 2020-09-07 (×4): 0.5 mg via INTRAVENOUS

## 2020-09-07 MED ORDER — CHLORHEXIDINE GLUCONATE 0.12 % MT SOLN: 15.0000 mL | Freq: Once | OROMUCOSAL | Status: AC

## 2020-09-07 MED ORDER — CEFAZOLIN SODIUM-DEXTROSE 2-4 GM/100ML-% IV SOLN
2.0000 g | Freq: Three times a day (TID) | INTRAVENOUS | Status: AC
  Administered 2020-09-07 (×2): 2 g via INTRAVENOUS
  Filled 2020-09-07 (×2): qty 100

## 2020-09-07 MED ORDER — MORPHINE SULFATE (PF) 2 MG/ML IV SOLN: 2.0000 mg | INTRAVENOUS | Status: DC | PRN

## 2020-09-07 MED ORDER — CEFAZOLIN SODIUM-DEXTROSE 2-4 GM/100ML-% IV SOLN
2.0000 g | INTRAVENOUS | Status: AC
  Administered 2020-09-07: 2 g via INTRAVENOUS
  Filled 2020-09-07: qty 100

## 2020-09-07 MED ORDER — FENTANYL CITRATE (PF) 250 MCG/5ML IJ SOLN
INTRAMUSCULAR | Status: AC
  Filled 2020-09-07: qty 5

## 2020-09-07 MED ORDER — HEMOSTATIC AGENTS (NO CHARGE) OPTIME
TOPICAL | Status: DC | PRN
  Administered 2020-09-07: 1 via TOPICAL

## 2020-09-07 MED ORDER — POLYETHYLENE GLYCOL 3350 17 G PO PACK: 17.0000 g | PACK | Freq: Every day | ORAL | Status: DC | PRN

## 2020-09-07 MED ORDER — ACETAMINOPHEN 500 MG PO TABS
1000.0000 mg | ORAL_TABLET | Freq: Once | ORAL | Status: AC
  Administered 2020-09-07: 1000 mg via ORAL

## 2020-09-07 MED ORDER — LIDOCAINE-EPINEPHRINE 1 %-1:100000 IJ SOLN
INTRAMUSCULAR | Status: DC | PRN
  Administered 2020-09-07: 4.5 mL

## 2020-09-07 MED ORDER — PROMETHAZINE HCL 25 MG/ML IJ SOLN
6.2500 mg | INTRAMUSCULAR | Status: DC | PRN
Start: 2020-09-07 — End: 2020-09-07

## 2020-09-07 MED ORDER — FLEET ENEMA 7-19 GM/118ML RE ENEM: 1.0000 | ENEMA | Freq: Once | RECTAL | Status: DC | PRN

## 2020-09-07 MED ORDER — ACETAMINOPHEN 500 MG PO TABS
ORAL_TABLET | ORAL | Status: AC
  Filled 2020-09-07: qty 2

## 2020-09-07 MED ORDER — HYDROMORPHONE HCL 1 MG/ML IJ SOLN
INTRAMUSCULAR | Status: AC
  Filled 2020-09-07: qty 2

## 2020-09-07 SURGICAL SUPPLY — 51 items
BAND RUBBER #18 3X1/16 STRL (MISCELLANEOUS) ×6 IMPLANT
BENZOIN TINCTURE PRP APPL 2/3 (GAUZE/BANDAGES/DRESSINGS) ×3 IMPLANT
BLADE CLIPPER SURG (BLADE) IMPLANT
BUR CARBIDE MATCH 3.0 (BURR) ×3 IMPLANT
BUR PRECISION FLUTE 5.0 (BURR) ×3 IMPLANT
CANISTER SUCT 3000ML PPV (MISCELLANEOUS) ×3 IMPLANT
CLOSURE WOUND 1/2 X4 (GAUZE/BANDAGES/DRESSINGS) ×1
COVER WAND RF STERILE (DRAPES) ×3 IMPLANT
DECANTER SPIKE VIAL GLASS SM (MISCELLANEOUS) ×3 IMPLANT
DERMABOND ADVANCED (GAUZE/BANDAGES/DRESSINGS) ×2
DERMABOND ADVANCED .7 DNX12 (GAUZE/BANDAGES/DRESSINGS) ×1 IMPLANT
DRAPE LAPAROTOMY 100X72X124 (DRAPES) ×3 IMPLANT
DRAPE MICROSCOPE LEICA (MISCELLANEOUS) ×3 IMPLANT
DRAPE SURG 17X23 STRL (DRAPES) ×3 IMPLANT
DRSG MEPILEX BORDER 4X4 (GAUZE/BANDAGES/DRESSINGS) IMPLANT
DRSG OPSITE POSTOP 3X4 (GAUZE/BANDAGES/DRESSINGS) ×3 IMPLANT
DURAPREP 26ML APPLICATOR (WOUND CARE) ×3 IMPLANT
ELECT COATED BLADE 2.86 ST (ELECTRODE) ×3 IMPLANT
ELECT REM PT RETURN 9FT ADLT (ELECTROSURGICAL) ×3
ELECTRODE REM PT RTRN 9FT ADLT (ELECTROSURGICAL) ×1 IMPLANT
GAUZE 4X4 16PLY RFD (DISPOSABLE) IMPLANT
GLOVE BIOGEL PI IND STRL 7.5 (GLOVE) ×2 IMPLANT
GLOVE BIOGEL PI INDICATOR 7.5 (GLOVE) ×4
GLOVE ECLIPSE 7.5 STRL STRAW (GLOVE) ×3 IMPLANT
GLOVE EXAM NITRILE XL STR (GLOVE) IMPLANT
GLOVE SURG POLYISO LF SZ7 (GLOVE) ×3 IMPLANT
GLOVE SURG UNDER POLY LF SZ7 (GLOVE) ×3 IMPLANT
GOWN STRL REUS W/ TWL LRG LVL3 (GOWN DISPOSABLE) ×2 IMPLANT
GOWN STRL REUS W/ TWL XL LVL3 (GOWN DISPOSABLE) ×4 IMPLANT
GOWN STRL REUS W/TWL 2XL LVL3 (GOWN DISPOSABLE) IMPLANT
GOWN STRL REUS W/TWL LRG LVL3 (GOWN DISPOSABLE) ×4
GOWN STRL REUS W/TWL XL LVL3 (GOWN DISPOSABLE) ×8
HEMOSTAT POWDER KIT SURGIFOAM (HEMOSTASIS) ×3 IMPLANT
KIT BASIN OR (CUSTOM PROCEDURE TRAY) ×3 IMPLANT
KIT TURNOVER KIT B (KITS) ×3 IMPLANT
NEEDLE HYPO 22GX1.5 SAFETY (NEEDLE) ×3 IMPLANT
NEEDLE SPNL 18GX3.5 QUINCKE PK (NEEDLE) IMPLANT
NS IRRIG 1000ML POUR BTL (IV SOLUTION) ×3 IMPLANT
PACK LAMINECTOMY NEURO (CUSTOM PROCEDURE TRAY) ×3 IMPLANT
PAD ARMBOARD 7.5X6 YLW CONV (MISCELLANEOUS) ×9 IMPLANT
SPONGE LAP 4X18 RFD (DISPOSABLE) IMPLANT
SPONGE SURGIFOAM ABS GEL SZ50 (HEMOSTASIS) ×3 IMPLANT
STRIP CLOSURE SKIN 1/2X4 (GAUZE/BANDAGES/DRESSINGS) ×2 IMPLANT
SUT MNCRL AB 4-0 PS2 18 (SUTURE) ×3 IMPLANT
SUT VIC AB 0 CT1 18XCR BRD8 (SUTURE) ×1 IMPLANT
SUT VIC AB 0 CT1 8-18 (SUTURE) ×2
SUT VIC AB 2-0 CP2 18 (SUTURE) ×3 IMPLANT
SYR 3ML LL SCALE MARK (SYRINGE) IMPLANT
TOWEL GREEN STERILE (TOWEL DISPOSABLE) ×3 IMPLANT
TOWEL GREEN STERILE FF (TOWEL DISPOSABLE) ×3 IMPLANT
WATER STERILE IRR 1000ML POUR (IV SOLUTION) ×3 IMPLANT

## 2020-09-07 NOTE — Consult Note (Signed)
CC:  Back and leg pain  HPI:     Patient is a 40 y.o. male with obesity presented to the emergency room for severe back and leg pain.  A month ago, he developed pain going down his right leg from his back.  This more recently progressed to involve both of his legs and in fact now his left leg is somewhat worse than his right leg.  He is fairly comfortable if he is in bed and not moving but if he tries to walk or raise his leg, he has significant pain.  There was question of urinary incontinence, but patient denies this now.  His postvoid residual was 125 mL in the emergency room.  A lumbar spine MRI showed showed a very large herniated disc at L4-5 with complete effacement of the thecal sac.  He was transferred from [facility] to [facility] for potential surgery.    Patient Active Problem List   Diagnosis Date Noted  . Cauda equina compression (HCC)   . Lumbar disc herniation   . Lumbar spinal stenosis 09/06/2020  . MDD (major depressive disorder), recurrent, in full remission (HCC) 04/02/2019  . MDD (major depressive disorder), recurrent episode, moderate (HCC) 02/20/2017  . Attention deficit hyperactivity disorder (ADHD) 02/20/2017   Past Medical History:  Diagnosis Date  . CES (cauda equina syndrome) (HCC)   . Hypertension     History reviewed. No pertinent surgical history.  Medications Prior to Admission  Medication Sig Dispense Refill Last Dose  . [START ON 09/23/2020] amphetamine-dextroamphetamine (ADDERALL XR) 20 MG 24 hr capsule Take 2 capsules (40 mg total) by mouth every morning. 60 capsule 0 Past Week at unk  . [START ON 09/24/2020] buPROPion (WELLBUTRIN XL) 150 MG 24 hr tablet Take total of 450 mg daily (300 mg + 150 mg ) 90 tablet 1 09/05/2020 at hs  . clonazePAM (KLONOPIN) 2 MG tablet Take 1 tablet (2 mg total) by mouth at bedtime. 30 tablet 1 09/06/2020 at hs  . HYDROcodone-acetaminophen (NORCO/VICODIN) 5-325 MG tablet Take 1 tablet by mouth every 6 (six) hours as needed. 12  tablet 0 Past Week at Unknown time  . ibuprofen (ADVIL,MOTRIN) 200 MG tablet Take 200 mg by mouth 3 (three) times daily as needed.   09/06/2020 at am  . levothyroxine (SYNTHROID, LEVOTHROID) 75 MCG tablet Take 75 mcg by mouth daily before breakfast. 1 whole tablet (weekdays Mon-Fri) 1.5 tabs on weekends (Sat-Sun)   09/07/2020 at 600am  . methocarbamol (ROBAXIN) 500 MG tablet Take 1 tablet (500 mg total) by mouth 2 (two) times daily. 20 tablet 0 09/06/2020 at unk  . predniSONE (STERAPRED UNI-PAK 21 TAB) 10 MG (21) TBPK tablet Take by mouth. 6 day taper , 21 qty 6-5-4-3-2-1   09/06/2020 at Unknown time  . [START ON 09/24/2020] sertraline (ZOLOFT) 100 MG tablet Take 1.5 tablets (150 mg total) by mouth at bedtime. 135 tablet 1 09/06/2020 at hs  . traMADol (ULTRAM) 50 MG tablet Take 50 mg by mouth 3 (three) times daily as needed.   09/06/2020 at 900am  . amphetamine-dextroamphetamine (ADDERALL XR) 20 MG 24 hr capsule Take 2 capsules (40 mg total) by mouth every morning. 60 capsule 0   . amphetamine-dextroamphetamine (ADDERALL XR) 20 MG 24 hr capsule Take 2 capsules (40 mg total) by mouth every morning. 60 capsule 0 x at x  . [START ON 09/24/2020] buPROPion (WELLBUTRIN XL) 300 MG 24 hr tablet Take total of 450 mg daily (300 mg + 150 mg )  90 tablet 1    No Known Allergies  Social History   Tobacco Use  . Smoking status: Never Smoker  . Smokeless tobacco: Never Used  Substance Use Topics  . Alcohol use: Yes    Comment: Socail     History reviewed. No pertinent family history.   Review of Systems Pertinent items noted in HPI and remainder of comprehensive ROS otherwise negative.  Objective:   Patient Vitals for the past 8 hrs:  BP Temp Temp src Pulse Resp SpO2  09/07/20 1001 (!) 147/86 98.2 F (36.8 C) Oral 94 18 96 %  09/07/20 0757 135/81 97.7 F (36.5 C) Oral 80 16 95 %   No intake/output data recorded. No intake/output data recorded.      General : Alert, cooperative, no distress, appears  stated age   Head:  Normocephalic/atraumatic    Eyes: PERRL, conjunctiva/corneas clear, EOM's intact. Fundi could not be visualized Neck: Supple Chest:  Respirations unlabored Chest wall: no tenderness or deformity Heart: Regular rate and rhythm Abdomen: Soft, nontender and nondistended Extremities: warm and well-perfused Skin: normal turgor, color and texture Neurologic:  Alert, oriented x 3.  Eyes open spontaneously. PERRL, EOMI, VFC, no facial droop. V1-3 intact.  No dysarthria, tongue protrusion symmetric.  CNII-XII intact. 1+  reflexes throughout.  No pronator drift, 3/5 R DF, 4/5 L DF, 4+/5 R PF, 4+/5 L PF, otw full strength.  Decreased sensation to light touch in lower extremities bilaterally.  + SLR       Data Review CBC:  Lab Results  Component Value Date   WBC 14.6 (H) 09/07/2020   RBC 5.28 09/07/2020   BMP:  Lab Results  Component Value Date   GLUCOSE 141 (H) 09/07/2020   CO2 28 09/07/2020   BUN 23 (H) 09/07/2020   CREATININE 1.14 09/07/2020   CALCIUM 9.9 09/07/2020   Radiology review:  MRI was reviewed.  See HPI.  Assessment:   Principal Problem:   Lumbar spinal stenosis Active Problems:   Attention deficit hyperactivity disorder (ADHD)   MDD (major depressive disorder), recurrent, in full remission [facility])  This is a 40 year old man with a large L4-5 disc herniation with severe neural element and possible early cauda equina syndrome.  Plan:   -Given the size of his disc herniation and his clinical symptomatology, I recommend urgent microdiscectomy with possible laminectomy.  Risks, benefits, alternatives, and expected convalescence were discussed with the patient.  He wished to proceed with surgery.  Informed consent was obtained.  We will plan for surgery today.

## 2020-09-07 NOTE — Transfer of Care (Signed)
Immediate Anesthesia Transfer of Care Note  Patient: Jon Beck  Procedure(s) Performed: LUMBAR LAMINECTOMY/DECOMPRESSION MICRODISCECTOMY LUMBAR FOUR-FIVE (Right )  Patient Location: PACU  Anesthesia Type:General  Level of Consciousness: awake, alert  and oriented  Airway & Oxygen Therapy: Patient Spontanous Breathing and Patient connected to nasal cannula oxygen  Post-op Assessment: Report given to RN, Post -op Vital signs reviewed and stable and Patient moving all extremities X 4  Post vital signs: Reviewed and stable  Last Vitals:  Vitals Value Taken Time  BP 131/77 09/07/20 1330  Temp 36.5 C 09/07/20 1330  Pulse 87 09/07/20 1341  Resp 18 09/07/20 1341  SpO2 100 % 09/07/20 1341  Vitals shown include unvalidated device data.  Last Pain:  Vitals:   09/07/20 1330  TempSrc:   PainSc: 0-No pain         Complications: No complications documented.

## 2020-09-07 NOTE — Anesthesia Preprocedure Evaluation (Addendum)
Anesthesia Evaluation  Patient identified by MRN, date of birth, ID band Patient awake    Reviewed: Allergy & Precautions, NPO status , Patient's Chart, lab work & pertinent test results  History of Anesthesia Complications Negative for: history of anesthetic complications  Airway Mallampati: II  TM Distance: >3 FB Neck ROM: Full    Dental no notable dental hx.    Pulmonary neg pulmonary ROS,    Pulmonary exam normal        Cardiovascular hypertension, Normal cardiovascular exam     Neuro/Psych Depression ADHDSevere spinal stenosis with cauda equina syndrome    GI/Hepatic negative GI ROS, Neg liver ROS,   Endo/Other  Hypothyroidism BMI 36  Renal/GU negative Renal ROS  negative genitourinary   Musculoskeletal negative musculoskeletal ROS (+)   Abdominal   Peds  Hematology negative hematology ROS (+)   Anesthesia Other Findings Day of surgery medications reviewed with patient.  Reproductive/Obstetrics negative OB ROS                            Anesthesia Physical Anesthesia Plan  ASA: III  Anesthesia Plan: General   Post-op Pain Management:    Induction: Intravenous  PONV Risk Score and Plan: 3 and Treatment may vary due to age or medical condition, Ondansetron, Dexamethasone and Midazolam  Airway Management Planned: Oral ETT  Additional Equipment: None  Intra-op Plan:   Post-operative Plan: Extubation in OR  Informed Consent: I have reviewed the patients History and Physical, chart, labs and discussed the procedure including the risks, benefits and alternatives for the proposed anesthesia with the patient or authorized representative who has indicated his/her understanding and acceptance.     Dental advisory given  Plan Discussed with: CRNA  Anesthesia Plan Comments:        Anesthesia Quick Evaluation

## 2020-09-07 NOTE — ED Notes (Signed)
Attempted to call [facility] at number [phone], phone rings multiple times without anyone answering.

## 2020-09-07 NOTE — Op Note (Signed)
PREOP DIAGNOSIS: Herniated nucleus pulposus at L4-5   POSTOP DIAGNOSIS: Herniated nucleus pulposus at L4-5  PROCEDURE: 1. Right L4 laminotomy, medial facetectomy for excision of herniated disc 2.  Use of microscope for microdissection  SURGEON: Dr. Hoyt Koch, MD  ASSISTANT: None   ANESTHESIA: General Endotracheal  EBL: 50 ml  SPECIMENS: None  DRAINS: None  COMPLICATIONS: none  CONDITION: Stable to PCAU  HISTORY: Jon Beck is a 40 y.o. male who presented to the hospital severe unilateral left leg pain and numbness. MRI showed a mass in the L4-5 With severe nerve impingement.  His PVR was 125 mL.  Given the severe nerve impingement, microdiscectomy was recommended medications.  Risks, benefits, alternatives, and expected convalescence were discussed.  Risks discussed included, but were not limited to, bleeding, pain, infection, scar, recurrent disc, instability, CSF leak, weakness, numbness, paralysis, and death.  informed consent was obtained and the patient wished to proceed.  PROCEDURE IN DETAIL: The patient was brought to the operating room. After induction of general anesthesia, the patient was positioned on the operative table in the prone position on a Wilson frame with all pressure points meticulously padded. The skin of the low back was then prepped and draped in the usual sterile fashion.  A preoperative x-ray was performed and using a spinal needle, L4-5 level was identified.  after timeout was conducted, the skin was infiltrated with local anesthetic.  Midline skin incision was then made sharply and the subcutaneous tissue and fascia were incised.  The paraspinous muscles were dissected elevated for lamina in subperiosteal fashion.  Self-retaining retractor was placed.  Microscope was then introduced into the field.  High-speed drill was used to perform a laminotomy as well as a modest medial facetectomy.  The ligamentum flavum was then removed with rongeurs.  The  epidural disc space was dissected with a 4 Penfield.  A portion of the large herniated disc was visualized.  This was dissected with ball ended probe and the massive disc fragment was able to be removed en bloc in 2 pieces.  A smaller remaining fragment still attached to the annulus was then removed with a pituitary rongeur.  Exploration with ball ended probe revealed no further free fragments and significant improvement and relaxation of the thecal sac and reve roots.  The foramen was probed and was widely patent.   Meticulous hemostasis was obtained.  The wound was irrigated thoroughly with bacitracin impregnated irrigation.  Depo-Medrol was then placed over the nerve root.  Theretractor was then withdrawn with hemostasis in the muscle obtained with bipolar.  The fascia was closed with 0 Vicryl stitches.  The dermal layer was closed with 2-0 Vicryl stitches in buried interrupted fashion.  The skin was closed with 4-0 Monocryl in subcuticular manner followed by Dermabond.  Patient was then flipped supine and extubated by the anesthesia service.  All counts were correct at the end of surgery.  No complications were noted.

## 2020-09-07 NOTE — Anesthesia Procedure Notes (Signed)
Procedure Name: Intubation Date/Time: 09/07/2020 11:18 AM Performed by: Marena Chancy, CRNA Pre-anesthesia Checklist: Patient identified, Emergency Drugs available, Suction available and Patient being monitored Patient Re-evaluated:Patient Re-evaluated prior to induction Oxygen Delivery Method: Circle System Utilized Preoxygenation: Pre-oxygenation with 100% oxygen Induction Type: IV induction Ventilation: Mask ventilation without difficulty Laryngoscope Size: Miller and 2 Grade View: Grade I Tube type: Oral Tube size: 8.0 mm Number of attempts: 1 Airway Equipment and Method: Stylet and Oral airway Placement Confirmation: ETT inserted through vocal cords under direct vision,  positive ETCO2 and breath sounds checked- equal and bilateral Tube secured with: Tape Dental Injury: Teeth and Oropharynx as per pre-operative assessment

## 2020-09-07 NOTE — Progress Notes (Signed)
PROGRESS NOTE    Jon Beck  EHM:[id number] DOB: Nov 06, 1980 DOA: 09/06/2020 PCP: Mila Palmer, MD   Chief Complaint  Patient presents with  . Left Leg Pain  Brief Narrative: 40 year old male with depression, ADHD, hypothyroidism, morbid obesity presented with severe left leg pain.  Symptom onset a month ago progressively getting worse involving both legs and left leg is somewhat worse than his right leg.  He was seen in the ED underwent extensive evaluation MRI showed a large central disc extrusion with superior migration at L4-L5 causing severe spinal canal stenosis with impingement of cauda equina.  Patient was placed on Decadron Dilaudid neurosurgery was consulted.  Subjective: Is resting comfortably.  Reports she does not have pain when he is not moving he feels better after pain medication he got last night. He was waiting for the neuro surgery  Assessment & Plan:  Severe low back pain Lumbar spinal stenosis Abnormal MRI with large disc extrusion L4-L5 causing severe canal stenosis with cauda equina impingement: Neurosurgery input pending continue n.p.o., Decadron, pain management.  Neuro checks monitoring to continue.  Morbid obesity BMI 36 we discussed about weight loss and also sleep on evaluation.  Reports since the COVID last year and being working from home endorse 30 pound weight gain.  Depression/anxiety/ADHD continue home Wellbutrin Zoloft and Klonopin  Hypothyroidism continues on Synthroid.  Leukocytosis likely from steroid use/reactive.  Monitor.  No fever.  Diet Order            Diet NPO time specified Except for: Sips with Meds, Ice Chips  Diet effective midnight               Patient's Body mass index is 36.26 kg/m. DVT prophylaxis: SCDs Start: 09/06/20 2343 Code Status:   Code Status: Full Code  Family Communication: plan of care discussed with patient at bedside.  Status is: Inpatient Remains inpatient appropriate because:IV treatments  appropriate due to intensity of illness or inability to take PO and Inpatient level of care appropriate due to severity of illness Dispo: The patient is from: Home              Anticipated d/c is to: Home              Patient currently is not medically stable to d/c.   Difficult to place patient No Unresulted Labs (From admission, onward)          Start     Ordered   09/07/20 0500  HIV Antibody (routine testing w rflx)  (HIV Antibody (Routine testing w reflex) panel)  Tomorrow morning,   R        09/06/20 2345   09/07/20 0500  Basic metabolic panel  Daily,   R      09/06/20 2345   09/07/20 0500  CBC  Daily,   R      09/06/20 2345          Medications reviewed:  Scheduled Meds: . buPROPion  450 mg Oral Daily  . clonazePAM  2 mg Oral QHS  . dexamethasone (DECADRON) injection  4 mg Intravenous Q8H  . [START ON 09/09/2020] levothyroxine  112 mcg Oral Once per day on Sun Sat  . levothyroxine  75 mcg Oral Once per day on Mon Tue Wed Thu Fri  . sertraline  150 mg Oral QHS   Continuous Infusions: . lactated ringers 90 mL/hr at 09/07/20 0135  . methocarbamol (ROBAXIN) IV      Consultants:see note  Procedures:see note  Antimicrobials: Anti-infectives (From admission, onward)   None     Culture/Microbiology    Component Value Date/Time   SDES URINE, CLEAN CATCH 07/08/2013 1641   SPECREQUEST NONE 07/08/2013 1641   CULT NO GROWTH Performed at [facility] 07/08/2013 1641   REPTSTATUS 07/09/2013 FINAL 07/08/2013 1641    Other culture-see note  Objective: Vitals: Today's Vitals   09/07/20 0021 09/07/20 0030 09/07/20 0106 09/07/20 0757  BP:  (!) 143/76 (!) 136/93 135/81  Pulse:  91 92 80  Resp:  19 17 16   Temp:  98.6 F (37 C) 97.8 F (36.6 C) 97.7 F (36.5 C)  TempSrc:   Oral Oral  SpO2:  94% 98% 95%  Weight:      Height:      PainSc: 0-No pain  0-No pain    No intake or output data in the 24 hours ending 09/07/20 0854 Filed Weights   09/06/20 1640   Weight: 117.9 kg   Weight change:   Intake/Output from previous day: No intake/output data recorded. Intake/Output this shift: No intake/output data recorded. Filed Weights   09/06/20 1640  Weight: 117.9 kg    Examination: General exam: AAOx3,obese,weak appearing. HEENT:Oral mucosa moist, Ear/Nose WNL grossly,dentition normal. Respiratory system: bilaterally clear, no crackles no use of accessory muscle, non tender. Cardiovascular system: S1 & S2 + no murmurNo JVD. Gastrointestinal system: Abdomen soft, NT,ND, BS+. Nervous System:Alert, awake, moving extremities, has difficulty extending hisleft leg with pain on leg raise. Extremities: no edema, distal peripheral pulses palpable.  Skin: No rashes,no icterus. MSK: Normal muscle bulk,tone, power  Data Reviewed: I have personally reviewed following labs and imaging studies CBC: Recent Labs  Lab 09/06/20 1742 09/07/20 0425  WBC 13.8* 14.6*  NEUTROABS 10.9*  --   HGB 15.8 15.0  HCT 48.9 45.2  MCV 85.9 85.6  PLT 308 280   Basic Metabolic Panel: Recent Labs  Lab 09/06/20 1742 09/07/20 0425  NA 136 137  K 4.4 4.4  CL 103 102  CO2 21* 28  GLUCOSE 108* 141*  BUN 22* 23*  CREATININE 0.93 1.14  CALCIUM 10.1 9.9   GFR: Estimated Creatinine Clearance: 111.3 mL/min (by C-G formula based on SCr of 1.14 mg/dL). Liver Function Tests: No results for input(s): AST, ALT, ALKPHOS, BILITOT, PROT, ALBUMIN in the last 168 hours. No results for input(s): LIPASE, AMYLASE in the last 168 hours. No results for input(s): AMMONIA in the last 168 hours. Coagulation Profile: No results for input(s): INR, PROTIME in the last 168 hours. Cardiac Enzymes: No results for input(s): CKTOTAL, CKMB, CKMBINDEX, TROPONINI in the last 168 hours. BNP (last 3 results) No results for input(s): PROBNP in the last 8760 hours. HbA1C: No results for input(s): HGBA1C in the last 72 hours. CBG: No results for input(s): GLUCAP in the last 168  hours. Lipid Profile: No results for input(s): CHOL, HDL, LDLCALC, TRIG, CHOLHDL, LDLDIRECT in the last 72 hours. Thyroid Function Tests: No results for input(s): TSH, T4TOTAL, FREET4, T3FREE, THYROIDAB in the last 72 hours. Anemia Panel: No results for input(s): VITAMINB12, FOLATE, FERRITIN, TIBC, IRON, RETICCTPCT in the last 72 hours. Sepsis Labs: No results for input(s): PROCALCITON, LATICACIDVEN in the last 168 hours.  Recent Results (from the past 240 hour(s))  Resp Panel by RT-PCR (Flu A&B, Covid) Nasopharyngeal Swab     Status: None   Collection Time: 09/06/20  9:49 PM   Specimen: Nasopharyngeal Swab; Nasopharyngeal(NP) swabs in vial transport medium  Result Value Ref Range Status   SARS  Coronavirus 2 by RT PCR NEGATIVE NEGATIVE Final    Comment: (NOTE) SARS-CoV-2 target nucleic acids are NOT DETECTED.  The SARS-CoV-2 RNA is generally detectable in upper respiratory specimens during the acute phase of infection. The lowest concentration of SARS-CoV-2 viral copies this assay can detect is 138 copies/mL. A negative result does not preclude SARS-Cov-2 infection and should not be used as the sole basis for treatment or other patient management decisions. A negative result may occur with  improper specimen collection/handling, submission of specimen other than nasopharyngeal swab, presence of viral mutation(s) within the areas targeted by this assay, and inadequate number of viral copies(<138 copies/mL). A negative result must be combined with clinical observations, patient history, and epidemiological information. The expected result is Negative.  Fact Sheet for Patients:  BloggerCourse.comhttps://www.fda.gov/media/152166/download  Fact Sheet for Healthcare Providers:  SeriousBroker.ithttps://www.fda.gov/media/152162/download  This test is no t yet approved or cleared by the [place] FDA and  has been authorized for detection and/or diagnosis of SARS-CoV-2 by FDA under an Emergency Use Authorization  (EUA). This EUA will remain  in effect (meaning this test can be used) for the duration of the COVID-19 declaration under Section 564(b)(1) of the Act, 21 U.S.C.section 360bbb-3(b)(1), unless the authorization is terminated  or revoked sooner.       Influenza A by PCR NEGATIVE NEGATIVE Final   Influenza B by PCR NEGATIVE NEGATIVE Final    Comment: (NOTE) The Xpert Xpress SARS-CoV-2/FLU/RSV plus assay is intended as an aid in the diagnosis of influenza from Nasopharyngeal swab specimens and should not be used as a sole basis for treatment. Nasal washings and aspirates are unacceptable for Xpert Xpress SARS-CoV-2/FLU/RSV testing.  Fact Sheet for Patients: BloggerCourse.comhttps://www.fda.gov/media/152166/download  Fact Sheet for Healthcare Providers: SeriousBroker.ithttps://www.fda.gov/media/152162/download  This test is not yet approved or cleared by the [place] FDA and has been authorized for detection and/or diagnosis of SARS-CoV-2 by FDA under an Emergency Use Authorization (EUA). This EUA will remain in effect (meaning this test can be used) for the duration of the COVID-19 declaration under Section 564(b)(1) of the Act, 21 U.S.C. section 360bbb-3(b)(1), unless the authorization is terminated or revoked.  Performed at [facility], [address] [place], [place] [address]   MRSA PCR Screening     Status: None   Collection Time: 09/07/20  1:30 AM   Specimen: Nasal Mucosa; Nasopharyngeal  Result Value Ref Range Status   MRSA by PCR NEGATIVE NEGATIVE Final    Comment:        The GeneXpert MRSA Assay (FDA approved for NASAL specimens only), is one component of a comprehensive MRSA colonization surveillance program. It is not intended to diagnose MRSA infection nor to guide or monitor treatment for MRSA infections. Performed at [facility], [address] [place], [place] [address]      Radiology Studies: MR LUMBAR SPINE WO CONTRAST  Result Date:  09/06/2020 CLINICAL DATA:  Low back pain EXAM: MRI LUMBAR SPINE WITHOUT CONTRAST TECHNIQUE: Multiplanar, multisequence MR imaging of the lumbar spine was performed. No intravenous contrast was administered. COMPARISON:  None. FINDINGS: Segmentation:  Standard. Alignment:  Physiologic. Vertebrae:  No fracture, evidence of discitis, or bone lesion. Conus medullaris and cauda equina: Conus extends to the T12-L1 level. Paraspinal and other soft tissues: Negative Disc levels: L1-L2: Normal disc space and facet joints. No spinal canal stenosis. No neural foraminal stenosis. L2-L3: Normal disc space and facet joints. No spinal canal stenosis. No neural foraminal stenosis. L3-L4: Small left subarticular disc  protrusion. Left lateral recess narrowing without central spinal canal stenosis. No neural foraminal stenosis. L4-L5: Large central disc extrusion with superior migration to the pedicle level. Severe spinal canal stenosis. No neural foraminal stenosis. L5-S1: Disc space narrowing. No spinal canal stenosis. No neural foraminal stenosis. Visualized sacrum: Normal. IMPRESSION: 1. Large central disc extrusion with superior migration at L4-L5 causing severe spinal canal stenosis with impingement of the cauda equina. 2. Small left subarticular disc protrusion at L3-L4 narrowing the left lateral recess. Correlate for left L4 radiculopathy. Electronically Signed   By: Deatra Robinson M.D.   On: 09/06/2020 21:34     LOS: 1 day   Lanae Boast, MD [facility]  09/07/2020, 8:54 AM

## 2020-09-07 NOTE — Plan of Care (Signed)
Pt admitted from [facility] to 5N-24. Pt is A&Ox4 with calm, pleasant demeanor. Reports no pain while lying still but experiences severe 10/10 sharp pain in left leg when ambulating. Pt is neurologically intact with purposeful movement in all extremities. VSS. Pt educated on call bell use and fall risk mitigation plan and has verbalized understanding. Call bell is within reach, will continue to monitor.   Problem: Education: Goal: Knowledge of General Education information will improve Description: Including pain rating scale, medication(s)/side effects and non-pharmacologic comfort measures Outcome: Progressing   Problem: Health Behavior/Discharge Planning: Goal: Ability to manage health-related needs will improve Outcome: Progressing   Problem: Clinical Measurements: Goal: Ability to maintain clinical measurements within normal limits will improve Outcome: Progressing Goal: Will remain free from infection Outcome: Progressing Goal: Cardiovascular complication will be avoided Outcome: Progressing   Problem: Nutrition: Goal: Adequate nutrition will be maintained Outcome: Progressing   Problem: Coping: Goal: Level of anxiety will decrease Outcome: Progressing   Problem: Elimination: Goal: Will not experience complications related to bowel motility Outcome: Progressing Goal: Will not experience complications related to urinary retention Outcome: Progressing   Problem: Pain Managment: Goal: General experience of comfort will improve Outcome: Progressing   Problem: Safety: Goal: Ability to remain free from injury will improve Outcome: Progressing   Problem: Skin Integrity: Goal: Risk for impaired skin integrity will decrease Outcome: Progressing

## 2020-09-07 NOTE — ED Notes (Signed)
Report given to [facility] paramedic Onalee Hua.

## 2020-09-08 ENCOUNTER — Encounter (HOSPITAL_COMMUNITY): Payer: Self-pay | Admitting: Neurosurgery

## 2020-09-08 DIAGNOSIS — M48062 Spinal stenosis, lumbar region with neurogenic claudication: Secondary | ICD-10-CM | POA: Diagnosis not present

## 2020-09-08 MED ORDER — DOCUSATE SODIUM 100 MG PO CAPS: 100.0000 mg | ORAL_CAPSULE | Freq: Two times a day (BID) | ORAL | 0 refills | Status: DC

## 2020-09-08 MED ORDER — CYCLOBENZAPRINE HCL 10 MG PO TABS: 10.0000 mg | ORAL_TABLET | Freq: Three times a day (TID) | ORAL | 0 refills | Status: DC | PRN

## 2020-09-08 MED ORDER — HYDROCODONE-ACETAMINOPHEN 5-325 MG PO TABS: 1.0000 | ORAL_TABLET | Freq: Four times a day (QID) | ORAL | 0 refills | Status: DC | PRN

## 2020-09-08 NOTE — Progress Notes (Signed)
Subjective: Patient reports his leg pain has fully resolved  Objective: Vital signs in last 24 hours: Temp:  [97.3 F (36.3 C)-98.1 F (36.7 C)] 97.6 F (36.4 C) (05/20 0907) Pulse Rate:  [63-94] 71 (05/20 0907) Resp:  [14-23] 17 (05/20 0907) BP: (103-142)/(55-89) 142/85 (05/20 0907) SpO2:  [90 %-100 %] 100 % (05/20 0907)  Intake/Output from previous day: 05/19 0701 - 05/20 0700 In: 1329.6 [I.V.:1229.6; IV Piggyback:100] Out: 50 [Blood:50] Intake/Output this shift: No intake/output data recorded.  NAD 5/5 strength HF, KE, DF, and PF bilaterally Incision c/d  Lab Results: Recent Labs    09/06/20 1742 09/07/20 0425  WBC 13.8* 14.6*  HGB 15.8 15.0  HCT 48.9 45.2  PLT 308 280   BMET Recent Labs    09/06/20 1742 09/07/20 0425  NA 136 137  K 4.4 4.4  CL 103 102  CO2 21* 28  GLUCOSE 108* 141*  BUN 22* 23*  CREATININE 0.93 1.14  CALCIUM 10.1 9.9    Studies/Results: DG Lumbar Spine 2-3 Views  Result Date: 09/07/2020 CLINICAL DATA:  Intraoperative study. EXAM: LUMBAR SPINE - 2-3 VIEW COMPARISON:  MRI lumbar spine 09/06/2020. FINDINGS: Lumbar spine numbered as per prior MRI. On image number 2 metallic marker noted posteriorly at the L4-L5 disc space level. IMPRESSION: On image number 2 metallic marker noted posteriorly at the L4-L5 disc space level. Electronically Signed   By: Maisie Fus  Register   On: 09/07/2020 15:39   MR LUMBAR SPINE WO CONTRAST  Result Date: 09/06/2020 CLINICAL DATA:  Low back pain EXAM: MRI LUMBAR SPINE WITHOUT CONTRAST TECHNIQUE: Multiplanar, multisequence MR imaging of the lumbar spine was performed. No intravenous contrast was administered. COMPARISON:  None. FINDINGS: Segmentation:  Standard. Alignment:  Physiologic. Vertebrae:  No fracture, evidence of discitis, or bone lesion. Conus medullaris and cauda equina: Conus extends to the T12-L1 level. Paraspinal and other soft tissues: Negative Disc levels: L1-L2: Normal disc space and facet joints. No  spinal canal stenosis. No neural foraminal stenosis. L2-L3: Normal disc space and facet joints. No spinal canal stenosis. No neural foraminal stenosis. L3-L4: Small left subarticular disc protrusion. Left lateral recess narrowing without central spinal canal stenosis. No neural foraminal stenosis. L4-L5: Large central disc extrusion with superior migration to the pedicle level. Severe spinal canal stenosis. No neural foraminal stenosis. L5-S1: Disc space narrowing. No spinal canal stenosis. No neural foraminal stenosis. Visualized sacrum: Normal. IMPRESSION: 1. Large central disc extrusion with superior migration at L4-L5 causing severe spinal canal stenosis with impingement of the cauda equina. 2. Small left subarticular disc protrusion at L3-L4 narrowing the left lateral recess. Correlate for left L4 radiculopathy. Electronically Signed   By: Deatra Robinson M.D.   On: 09/06/2020 21:34    Assessment/Plan: S/p right L4-5 microdiscectomy for massive disc herniation, doing well - plan to f/u in clinic in 2 weeks - Vicodin, Flexeril for pain control - colace for stool softeners - d/c instructions given  Jon Beck 09/08/2020, 11:05 AM

## 2020-09-08 NOTE — Progress Notes (Signed)
Spoke with Physical Therapy.  Pt is completely independent at this time.  PT reviewed back precautions and how they relate to LE dressing with pt.  No OT needs at this time. Screened and sign off.  Tory Emerald, [place] [phone]

## 2020-09-08 NOTE — Plan of Care (Signed)

## 2020-09-08 NOTE — Discharge Summary (Signed)
Physician Discharge Summary  Jon Beck ZOX:[id number] DOB: January 31, 1981 DOA: 09/06/2020  PCP: Mila Palmer, MD  Admit date: 09/06/2020 Discharge date: 09/08/2020  Admitted From: home Disposition:  home  Recommendations for Outpatient Follow-up:  1. Follow up with PCP and Dr Maisie Fus  in 1-2 weeks  Home Health:no  Equipment/Devices: none  Discharge Condition: Stable Code Status:   Code Status: Full Code Diet recommendation:  Diet Order            Diet - low sodium heart healthy           Diet regular Room service appropriate? Yes; Fluid consistency: Thin  Diet effective now                  Brief/Interim Summary: Brief Narrative: 40 year old male with depression, ADHD, hypothyroidism, morbid obesity presented with severe left leg pain.  Symptom onset a month ago progressively getting worse involving both legs and left leg is somewhat worse than his right leg.  He was seen in the ED underwent extensive evaluation MRI showed a large central disc extrusion with superior migration at L4-L5 causing severe spinal canal stenosis with impingement of cauda equina.  Patient was placed on Decadron Dilaudid neurosurgery was consulted. S/p  Right L4 laminotomy, medial facetectomy for excision of herniated disc 5/19 by Dr Maisie Fus. Seen psot op. Pain resolved. Ambulatory. neurosx in room and cleared for d/c home and d/c instruction was provided by neurosx for wt restriction and follow up. Seen by PT and cleared.  Discharge Diagnoses:  Severe low back pain Lumbar spinal stenosis Abnormal MRI with large disc extrusion L4-L5 causing severe canal stenosis with cauda equina impingement: Status post right L4 laminectomy, medial facetectomy for excision of herniated disc on 5/19 by Dr Maisie Fus.  Continue Flexeril, oral norco prn and muscle relaxant and f/u with neurosurgery as outpatient. PT cleared.  Will prescribe oral hydrocodone per neurosurgery request.  Morbid obesity BMI 36 we discussed about  weight loss and also sleep on evaluation.  Reports since the COVID last year and being working from home endorse 30 pound weight gain.  Depression/anxiety/ADHD mood is stable on home Wellbutrin Zoloft and Klonopin  Hypothyroidism on Synthroid.  Leukocytosis likely from steroid use/reactive.  Monitor.  No fever.  Off Decadron.   Consults:  neurosurgery  Subjective: Aaox3, no pain, ambulatory, no complaitns  Discharge Exam: Vitals:   09/08/20 0442 09/08/20 0907  BP: 116/69 (!) 142/85  Pulse: 63 71  Resp: 18 17  Temp: (!) 97.5 F (36.4 C) 97.6 F (36.4 C)  SpO2: 95% 100%   General: Pt is alert, awake, not in acute distress Cardiovascular: RRR, S1/S2 +, no rubs, no gallops Respiratory: CTA bilaterally, no wheezing, no rhonchi Abdominal: Soft, NT, ND, bowel sounds + Extremities: no edema, no cyanosis  Discharge Instructions  Discharge Instructions    Diet - low sodium heart healthy   Complete by: As directed    Discharge instructions   Complete by: As directed    Please call call MD or return to ER for similar or worsening recurring problem that brought you to hospital or if any fever,nausea/vomiting,abdominal pain, uncontrolled pain, chest pain,  shortness of breath or any other alarming symptoms.  Please follow-up your doctor as instructed in a week time and call the office for appointment.  Please avoid alcohol, smoking, or any other illicit substance and maintain healthy habits including taking your regular medications as prescribed.  You were cared for by a hospitalist during your hospital stay. If  you have any questions about your discharge medications or the care you received while you were in the hospital after you are discharged, you can call the unit and ask to speak with the hospitalist on call if the hospitalist that took care of you is not available.  Once you are discharged, your primary care physician will handle any further medical issues. Please note that  NO REFILLS for any discharge medications will be authorized once you are discharged, as it is imperative that you return to your primary care physician (or establish a relationship with a primary care physician if you do not have one) for your aftercare needs so that they can reassess your need for medications and monitor your lab values   Incentive spirometry RT   Complete by: As directed    Increase activity slowly   Complete by: As directed    No heavy lifting as advised by neurosurgery, call office   No wound care   Complete by: As directed      Allergies as of 09/08/2020   No Known Allergies     Medication List    STOP taking these medications   methocarbamol 500 MG tablet Commonly known as: ROBAXIN   predniSONE 10 MG (21) Tbpk tablet Commonly known as: STERAPRED UNI-PAK 21 TAB   traMADol 50 MG tablet Commonly known as: ULTRAM     TAKE these medications   amphetamine-dextroamphetamine 20 MG 24 hr capsule Commonly known as: Adderall XR Take 2 capsules (40 mg total) by mouth every morning. What changed: Another medication with the same name was removed. Continue taking this medication, and follow the directions you see here.   buPROPion 150 MG 24 hr tablet Commonly known as: WELLBUTRIN XL Take total of 450 mg daily (300 mg + 150 mg ) Start taking on: September 24, 2020 What changed: Another medication with the same name was removed. Continue taking this medication, and follow the directions you see here.   clonazePAM 2 MG tablet Commonly known as: KLONOPIN Take 1 tablet (2 mg total) by mouth at bedtime.   cyclobenzaprine 10 MG tablet Commonly known as: FLEXERIL Take 1 tablet (10 mg total) by mouth 3 (three) times daily as needed for up to 30 doses for muscle spasms.   docusate sodium 100 MG capsule Commonly known as: COLACE Take 1 capsule (100 mg total) by mouth 2 (two) times daily.   HYDROcodone-acetaminophen 5-325 MG tablet Commonly known as: NORCO/VICODIN Take 1  tablet by mouth every 6 (six) hours as needed for up to 15 doses for severe pain. What changed: reasons to take this   ibuprofen 200 MG tablet Commonly known as: ADVIL Take 200 mg by mouth 3 (three) times daily as needed.   levothyroxine 75 MCG tablet Commonly known as: SYNTHROID Take 75 mcg by mouth daily before breakfast. 1 whole tablet (weekdays Mon-Fri) 1.5 tabs on weekends (Sat-Sun)   sertraline 100 MG tablet Commonly known as: ZOLOFT Take 1.5 tablets (150 mg total) by mouth at bedtime. Start taking on: September 24, 2020       Follow-up Information    Mila Palmer, MD Follow up in 1 week(s).   Specialty: Family Medicine Contact information: [address] [place] [address] [phone]        Bedelia Person, MD. Schedule an appointment as soon as possible for a visit in 2 week(s).   Specialty: Neurosurgery Contact information: [address] [place] [address] [phone]  No Known Allergies  The results of significant diagnostics from this hospitalization (including imaging, microbiology, ancillary and laboratory) are listed below for reference.    Microbiology: Recent Results (from the past 240 hour(s))  Resp Panel by RT-PCR (Flu A&B, Covid) Nasopharyngeal Swab     Status: None   Collection Time: 09/06/20  9:49 PM   Specimen: Nasopharyngeal Swab; Nasopharyngeal(NP) swabs in vial transport medium  Result Value Ref Range Status   SARS Coronavirus 2 by RT PCR NEGATIVE NEGATIVE Final    Comment: (NOTE) SARS-CoV-2 target nucleic acids are NOT DETECTED.  The SARS-CoV-2 RNA is generally detectable in upper respiratory specimens during the acute phase of infection. The lowest concentration of SARS-CoV-2 viral copies this assay can detect is 138 copies/mL. A negative result does not preclude SARS-Cov-2 infection and should not be used as the sole basis for treatment or other patient management decisions. A  negative result may occur with  improper specimen collection/handling, submission of specimen other than nasopharyngeal swab, presence of viral mutation(s) within the areas targeted by this assay, and inadequate number of viral copies(<138 copies/mL). A negative result must be combined with clinical observations, patient history, and epidemiological information. The expected result is Negative.  Fact Sheet for Patients:  BloggerCourse.comhttps://www.fda.gov/media/152166/download  Fact Sheet for Healthcare Providers:  SeriousBroker.ithttps://www.fda.gov/media/152162/download  This test is no t yet approved or cleared by the [place] FDA and  has been authorized for detection and/or diagnosis of SARS-CoV-2 by FDA under an Emergency Use Authorization (EUA). This EUA will remain  in effect (meaning this test can be used) for the duration of the COVID-19 declaration under Section 564(b)(1) of the Act, 21 U.S.C.section 360bbb-3(b)(1), unless the authorization is terminated  or revoked sooner.       Influenza A by PCR NEGATIVE NEGATIVE Final   Influenza B by PCR NEGATIVE NEGATIVE Final    Comment: (NOTE) The Xpert Xpress SARS-CoV-2/FLU/RSV plus assay is intended as an aid in the diagnosis of influenza from Nasopharyngeal swab specimens and should not be used as a sole basis for treatment. Nasal washings and aspirates are unacceptable for Xpert Xpress SARS-CoV-2/FLU/RSV testing.  Fact Sheet for Patients: BloggerCourse.comhttps://www.fda.gov/media/152166/download  Fact Sheet for Healthcare Providers: SeriousBroker.ithttps://www.fda.gov/media/152162/download  This test is not yet approved or cleared by the [place] FDA and has been authorized for detection and/or diagnosis of SARS-CoV-2 by FDA under an Emergency Use Authorization (EUA). This EUA will remain in effect (meaning this test can be used) for the duration of the COVID-19 declaration under Section 564(b)(1) of the Act, 21 U.S.C. section 360bbb-3(b)(1), unless the authorization  is terminated or revoked.  Performed at [facility], [address] [place], [place] [address]   MRSA PCR Screening     Status: None   Collection Time: 09/07/20  1:30 AM   Specimen: Nasal Mucosa; Nasopharyngeal  Result Value Ref Range Status   MRSA by PCR NEGATIVE NEGATIVE Final    Comment:        The GeneXpert MRSA Assay (FDA approved for NASAL specimens only), is one component of a comprehensive MRSA colonization surveillance program. It is not intended to diagnose MRSA infection nor to guide or monitor treatment for MRSA infections. Performed at [facility], [address] [place], [place] [address]     Procedures/Studies: DG Lumbar Spine 2-3 Views  Result Date: 09/07/2020 CLINICAL DATA:  Intraoperative study. EXAM: LUMBAR SPINE - 2-3 VIEW COMPARISON:  MRI lumbar spine 09/06/2020. FINDINGS: Lumbar spine numbered as per prior MRI. On  image number 2 metallic marker noted posteriorly at the L4-L5 disc space level. IMPRESSION: On image number 2 metallic marker noted posteriorly at the L4-L5 disc space level. Electronically Signed   By: Maisie Fus  Register   On: 09/07/2020 15:39   MR LUMBAR SPINE WO CONTRAST  Result Date: 09/06/2020 CLINICAL DATA:  Low back pain EXAM: MRI LUMBAR SPINE WITHOUT CONTRAST TECHNIQUE: Multiplanar, multisequence MR imaging of the lumbar spine was performed. No intravenous contrast was administered. COMPARISON:  None. FINDINGS: Segmentation:  Standard. Alignment:  Physiologic. Vertebrae:  No fracture, evidence of discitis, or bone lesion. Conus medullaris and cauda equina: Conus extends to the T12-L1 level. Paraspinal and other soft tissues: Negative Disc levels: L1-L2: Normal disc space and facet joints. No spinal canal stenosis. No neural foraminal stenosis. L2-L3: Normal disc space and facet joints. No spinal canal stenosis. No neural foraminal stenosis. L3-L4: Small left subarticular disc protrusion. Left lateral recess narrowing  without central spinal canal stenosis. No neural foraminal stenosis. L4-L5: Large central disc extrusion with superior migration to the pedicle level. Severe spinal canal stenosis. No neural foraminal stenosis. L5-S1: Disc space narrowing. No spinal canal stenosis. No neural foraminal stenosis. Visualized sacrum: Normal. IMPRESSION: 1. Large central disc extrusion with superior migration at L4-L5 causing severe spinal canal stenosis with impingement of the cauda equina. 2. Small left subarticular disc protrusion at L3-L4 narrowing the left lateral recess. Correlate for left L4 radiculopathy. Electronically Signed   By: Deatra Robinson M.D.   On: 09/06/2020 21:34   CT Renal Stone Study  Result Date: 08/26/2020 CLINICAL DATA:  Right flank pain EXAM: CT ABDOMEN AND PELVIS WITHOUT CONTRAST TECHNIQUE: Multidetector CT imaging of the abdomen and pelvis was performed following the standard protocol without IV contrast. COMPARISON:  None. FINDINGS: LOWER CHEST: Normal. HEPATOBILIARY: Normal hepatic contours. No intra- or extrahepatic biliary dilatation. Normal gallbladder. PANCREAS: Normal pancreas. No ductal dilatation or peripancreatic fluid collection. SPLEEN: Normal. ADRENALS/URINARY TRACT: The adrenal glands are normal. No hydronephrosis, nephroureterolithiasis or solid renal mass. The urinary bladder is normal for degree of distention STOMACH/BOWEL: There is no hiatal hernia. Normal duodenal course and caliber. No small bowel dilatation or inflammation. No focal colonic abnormality. Normal appendix. VASCULAR/LYMPHATIC: Hazy attenuation of the mid abdominal mesenteric fat. No lymphadenopathy. Normal vessels. REPRODUCTIVE: Normal prostate size with symmetric seminal vesicles. MUSCULOSKELETAL. No bony spinal canal stenosis or focal osseous abnormality. OTHER: None. IMPRESSION: 1. No obstructive uropathy or nephroureterolithiasis. 2. Hazy attenuation of the mid abdominal mesenteric fat may indicate mesenteric  panniculitis. Electronically Signed   By: Deatra Robinson M.D.   On: 08/26/2020 19:18   US SCROTUM W/DOPPLER  Result Date: 08/26/2020 CLINICAL DATA:  Right testicular pain. EXAM: SCROTAL ULTRASOUND DOPPLER ULTRASOUND OF THE TESTICLES TECHNIQUE: Complete ultrasound examination of the testicles, epididymis, and other scrotal structures was performed. Color and spectral Doppler ultrasound were also utilized to evaluate blood flow to the testicles. COMPARISON:  None. FINDINGS: Right testicle Measurements: 3.1 cm x 2.5 cm x 2.5 cm. No mass or microlithiasis visualized. Left testicle Measurements: 2.9 cm x 1.6 cm x 2.8 cm. No mass or microlithiasis visualized. Right epididymis:  Normal in size and appearance. Left epididymis:  Normal in size and appearance. Hydrocele:  None visualized. Varicocele:  A small left-sided varicocele is noted. Pulsed Doppler interrogation of both testes demonstrates normal low resistance arterial and venous waveforms bilaterally. IMPRESSION: Small left-sided varicocele. Electronically Signed   By: Aram Candela M.D.   On: 08/26/2020 20:34    Labs: BNP (last 3  results) No results for input(s): BNP in the last 8760 hours. Basic Metabolic Panel: Recent Labs  Lab 09/06/20 1742 09/07/20 0425  NA 136 137  K 4.4 4.4  CL 103 102  CO2 21* 28  GLUCOSE 108* 141*  BUN 22* 23*  CREATININE 0.93 1.14  CALCIUM 10.1 9.9   Liver Function Tests: No results for input(s): AST, ALT, ALKPHOS, BILITOT, PROT, ALBUMIN in the last 168 hours. No results for input(s): LIPASE, AMYLASE in the last 168 hours. No results for input(s): AMMONIA in the last 168 hours. CBC: Recent Labs  Lab 09/06/20 1742 09/07/20 0425  WBC 13.8* 14.6*  NEUTROABS 10.9*  --   HGB 15.8 15.0  HCT 48.9 45.2  MCV 85.9 85.6  PLT 308 280   Cardiac Enzymes: No results for input(s): CKTOTAL, CKMB, CKMBINDEX, TROPONINI in the last 168 hours. BNP: Invalid input(s): POCBNP CBG: No results for input(s): GLUCAP in  the last 168 hours. D-Dimer No results for input(s): DDIMER in the last 72 hours. Hgb A1c No results for input(s): HGBA1C in the last 72 hours. Lipid Profile No results for input(s): CHOL, HDL, LDLCALC, TRIG, CHOLHDL, LDLDIRECT in the last 72 hours. Thyroid function studies No results for input(s): TSH, T4TOTAL, T3FREE, THYROIDAB in the last 72 hours.  Invalid input(s): FREET3 Anemia work up No results for input(s): VITAMINB12, FOLATE, FERRITIN, TIBC, IRON, RETICCTPCT in the last 72 hours. Urinalysis    Component Value Date/Time   COLORURINE YELLOW 09/06/2020 1820   APPEARANCEUR CLEAR 09/06/2020 1820   LABSPEC 1.021 09/06/2020 1820   PHURINE 6.0 09/06/2020 1820   GLUCOSEU NEGATIVE 09/06/2020 1820   HGBUR NEGATIVE 09/06/2020 1820   BILIRUBINUR NEGATIVE 09/06/2020 1820   KETONESUR NEGATIVE 09/06/2020 1820   PROTEINUR NEGATIVE 09/06/2020 1820   UROBILINOGEN 0.2 07/08/2013 1329   NITRITE NEGATIVE 09/06/2020 1820   LEUKOCYTESUR NEGATIVE 09/06/2020 1820   Sepsis Labs Invalid input(s): PROCALCITONIN,  WBC,  LACTICIDVEN Microbiology Recent Results (from the past 240 hour(s))  Resp Panel by RT-PCR (Flu A&B, Covid) Nasopharyngeal Swab     Status: None   Collection Time: 09/06/20  9:49 PM   Specimen: Nasopharyngeal Swab; Nasopharyngeal(NP) swabs in vial transport medium  Result Value Ref Range Status   SARS Coronavirus 2 by RT PCR NEGATIVE NEGATIVE Final    Comment: (NOTE) SARS-CoV-2 target nucleic acids are NOT DETECTED.  The SARS-CoV-2 RNA is generally detectable in upper respiratory specimens during the acute phase of infection. The lowest concentration of SARS-CoV-2 viral copies this assay can detect is 138 copies/mL. A negative result does not preclude SARS-Cov-2 infection and should not be used as the sole basis for treatment or other patient management decisions. A negative result may occur with  improper specimen collection/handling, submission of specimen other than  nasopharyngeal swab, presence of viral mutation(s) within the areas targeted by this assay, and inadequate number of viral copies(<138 copies/mL). A negative result must be combined with clinical observations, patient history, and epidemiological information. The expected result is Negative.  Fact Sheet for Patients:  BloggerCourse.com  Fact Sheet for Healthcare Providers:  SeriousBroker.it  This test is no t yet approved or cleared by the [place] FDA and  has been authorized for detection and/or diagnosis of SARS-CoV-2 by FDA under an Emergency Use Authorization (EUA). This EUA will remain  in effect (meaning this test can be used) for the duration of the COVID-19 declaration under Section 564(b)(1) of the Act, 21 U.S.C.section 360bbb-3(b)(1), unless the authorization is terminated  or revoked sooner.  Influenza A by PCR NEGATIVE NEGATIVE Final   Influenza B by PCR NEGATIVE NEGATIVE Final    Comment: (NOTE) The Xpert Xpress SARS-CoV-2/FLU/RSV plus assay is intended as an aid in the diagnosis of influenza from Nasopharyngeal swab specimens and should not be used as a sole basis for treatment. Nasal washings and aspirates are unacceptable for Xpert Xpress SARS-CoV-2/FLU/RSV testing.  Fact Sheet for Patients: BloggerCourse.com  Fact Sheet for Healthcare Providers: SeriousBroker.it  This test is not yet approved or cleared by the [place] FDA and has been authorized for detection and/or diagnosis of SARS-CoV-2 by FDA under an Emergency Use Authorization (EUA). This EUA will remain in effect (meaning this test can be used) for the duration of the COVID-19 declaration under Section 564(b)(1) of the Act, 21 U.S.C. section 360bbb-3(b)(1), unless the authorization is terminated or revoked.  Performed at [facility], [address] [place], [place] [address]   MRSA PCR Screening     Status: None   Collection Time: 09/07/20  1:30 AM   Specimen: Nasal Mucosa; Nasopharyngeal  Result Value Ref Range Status   MRSA by PCR NEGATIVE NEGATIVE Final    Comment:        The GeneXpert MRSA Assay (FDA approved for NASAL specimens only), is one component of a comprehensive MRSA colonization surveillance program. It is not intended to diagnose MRSA infection nor to guide or monitor treatment for MRSA infections. Performed at [facility], [address], [place], [place] [address]      Time coordinating discharge: 25 minutes  SIGNED: Lanae Boast, MD  [facility] 09/08/2020, 11:35 AM  If 7PM-7AM, please contact night-coverage [email or web address]

## 2020-09-08 NOTE — Evaluation (Signed)
Physical Therapy Evaluation Patient Details Name: Jon Beck MRN: [id number] DOB: 1980-08-25 Today's Date: 09/08/2020   History of Present Illness  40 y.o. male with medical history significant for depression, ADHD, and hypothyroidism, presenting to the emergency department for evaluation of severe left leg pain. MRI lumbar spine reveals large central disc extrusion with superior migration at L4-L5 causing severe spinal canal stenosis with impingement of cauda equina. Pt underwent right L4 laminotomy and medial facetectomy for excision of herniated disc 5/19.    Clinical Impression  PT eval complete. Pt is independent with all functional mobility. Pt reports LLE pain has completely resolved. Educated on back precautions. Handout provided. All questions answered. No further PT intervention indicated. PT signing off.      Follow Up Recommendations No PT follow up    Equipment Recommendations  None recommended by PT    Recommendations for Other Services       Precautions / Restrictions Precautions Precautions: Back Precaution Booklet Issued: Yes (comment) Precaution Comments: educated on 3/3 back precautions Restrictions Other Position/Activity Restrictions: no brace needed      Mobility  Bed Mobility Overal bed mobility: Modified Independent             General bed mobility comments: good technique with logroll    Transfers Overall transfer level: Independent Equipment used: None                Ambulation/Gait Ambulation/Gait assistance: Independent Gait Distance (Feet): 350 Feet Assistive device: None Gait Pattern/deviations: WFL(Within Functional Limits) Gait velocity: WFL Gait velocity interpretation: >2.62 ft/sec, indicative of community ambulatory General Gait Details: steady gait  Stairs Stairs: Yes Stairs assistance: Modified independent (Device/Increase time) Stair Management: One rail Left;Alternating pattern Number of Stairs: 12     Wheelchair Mobility    Modified Rankin (Stroke Patients Only)       Balance Overall balance assessment: No apparent balance deficits (not formally assessed)                                           Pertinent Vitals/Pain Pain Assessment: Faces Faces Pain Scale: Hurts a little bit Pain Location: back incision site Pain Descriptors / Indicators: Sore Pain Intervention(s): Monitored during session    Home Living Family/patient expects to be discharged to:: Private residence Living Arrangements: Spouse/significant other Available Help at Discharge: Family;Available PRN/intermittently Type of Home: House Home Access: Stairs to enter Entrance Stairs-Rails: None Entrance Stairs-Number of Steps: 2 Home Layout: Able to live on main level with bedroom/bathroom;Two level Home Equipment: None      Prior Function Level of Independence: Independent               Hand Dominance        Extremity/Trunk Assessment   Upper Extremity Assessment Upper Extremity Assessment: Overall WFL for tasks assessed    Lower Extremity Assessment Lower Extremity Assessment: Overall WFL for tasks assessed    Cervical / Trunk Assessment Cervical / Trunk Assessment: Other exceptions Cervical / Trunk Exceptions: s/p back sx  Communication   Communication: No difficulties  Cognition Arousal/Alertness: Awake/alert Behavior During Therapy: WFL for tasks assessed/performed Overall Cognitive Status: Within Functional Limits for tasks assessed  General Comments General comments (skin integrity, edema, etc.): Educated on crossing ankle over knee to don shoes/socks, to avoid bending.    Exercises     Assessment/Plan    PT Assessment Patent does not need any further PT services  PT Problem List         PT Treatment Interventions      PT Goals (Current goals can be found in the Care Plan section)  Acute Rehab  PT Goals Patient Stated Goal: home today PT Goal Formulation: All assessment and education complete, DC therapy    Frequency     Barriers to discharge        Co-evaluation               AM-PAC PT "6 Clicks" Mobility  Outcome Measure Help needed turning from your back to your side while in a flat bed without using bedrails?: None Help needed moving from lying on your back to sitting on the side of a flat bed without using bedrails?: None Help needed moving to and from a bed to a chair (including a wheelchair)?: None Help needed standing up from a chair using your arms (e.g., wheelchair or bedside chair)?: None Help needed to walk in hospital room?: None Help needed climbing 3-5 steps with a railing? : None 6 Click Score: 24    End of Session   Activity Tolerance: Patient tolerated treatment well Patient left: in bed;with call bell/phone within reach Nurse Communication: Mobility status PT Visit Diagnosis: Difficulty in walking, not elsewhere classified (R26.2)    Time: 2956-2130 PT Time Calculation (min) (ACUTE ONLY): 15 min   Charges:   PT Evaluation $PT Eval Low Complexity: 1 Low          Aida Raider, PT  Office # [phone] Pager [phone]   Ilda Foil 09/08/2020, 9:16 AM

## 2020-09-08 NOTE — Progress Notes (Signed)
Patient set to discharge with transportation provided by spouse to home.  Patient prior to discharge received AVS and education provided by primary RN regarding follow up appointments and changes to medications with no questions/concerns voiced by the patient.  Patient's PIVs were removed prior to discharge with primary RN escorting patient and spouse off the unit at approximately 1030.

## 2020-09-08 NOTE — Anesthesia Postprocedure Evaluation (Signed)
Anesthesia Post Note  Patient: Jon Beck  Procedure(s) Performed: LUMBAR LAMINECTOMY/DECOMPRESSION MICRODISCECTOMY LUMBAR FOUR-FIVE (Right )     Patient location during evaluation: PACU Anesthesia Type: General Level of consciousness: awake and alert and oriented Pain management: pain level controlled Vital Signs Assessment: post-procedure vital signs reviewed and stable Respiratory status: spontaneous breathing, nonlabored ventilation and respiratory function stable Cardiovascular status: blood pressure returned to baseline Postop Assessment: no apparent nausea or vomiting Anesthetic complications: no   No complications documented.   Kaylyn Layer

## 2020-09-08 NOTE — Discharge Instructions (Signed)
Walk as much as possible No heavy lifting >10 lbs No excessive bending/twisting at the waist    Lumbar Diskectomy, Care After This sheet gives you information about how to care for yourself after your procedure. Your health care provider may also give you more specific instructions. If you have problems or questions, contact your health care provider. What can I expect after the procedure? After the procedure, it is common to have:  Pain in the lower back, in the area of the incision.  Numbness in the legs or in the lower back.  Weakness in the legs. Follow these instructions at home: Medicines  Take over-the-counter and prescription medicines as told by your health care provider. Finish all antibiotic medicine even when you start to feel better.  If you were prescribed an antibiotic medicine, take it as told by your health care provider. Do not stop using the antibiotic even if you start to feel better.  Ask your health care provider if the medicine prescribed to you: ? Requires you to avoid driving or using machinery. ? Can cause constipation. You may need to take these actions to prevent or treat constipation:  Drink enough fluid to keep your urine pale yellow.  Take over-the-counter or prescription medicines.  Eat foods that are high in fiber, such as beans, whole grains, and fresh fruits and vegetables.  Limit foods that are high in fat and processed sugars, such as fried or sweet foods. Incision care  Follow instructions from your health care provider about how to take care of your incision. Make sure you: ? Wash your hands with soap and water for at least 20 seconds before you change your bandage (dressing). If soap and water are not available, use hand sanitizer. ? Change your dressing as told by your health care provider. You may need to have someone change your dressing for you. ? Leave stitches (sutures), skin glue, or adhesive strips in place. These skin closures  may need to stay in place for 2 weeks or longer. If adhesive strip edges start to loosen and curl up, you may trim the loose edges. Do not remove adhesive strips completely unless your health care provider tells you to do that.  Check your incision area every day for signs of infection. If you cannot see your incision, have someone check it for you. Check for: ? More redness, swelling, or pain. ? Fluid or blood. ? Warmth. ? Pus or a bad smell.   Bathing  Do not take baths, swim, or use a hot tub until your health care provider approves. Ask your health care provider if you may take showers. You may only be allowed to take sponge baths.  Keep the dressing dry until your health care provider says it can be removed. Activity  Rest as told by your health care provider.  Avoid sitting or lying for a long time without moving. Get up to take short walks every 1-2 hours. This is important to improve blood flow and breathing. Ask for help if you feel weak or unsteady.  Do not climb stairs more than one time each day until your health care provider approves.  Do not bend or twist at the waist until your health care provider approves. To lower yourself to pick things up, bend your knees instead of tipping your upper body forward.  Do not lift anything that is heavier than 10 lb (4.5 kg), or the limit that you are told, until your health care provider says that  it is safe. Avoid lifting anything above the level of your head.  Avoid pushing and pulling motions, or other motions that require a lot of effort, such as vacuuming.  Do exercises as told by your health care provider.  Return to your normal activities as told by your health care provider. Ask your health care provider what activities are safe for you. Managing pain, stiffness, and swelling  If directed, put ice on the affected area. To do this: ? If you have a removable brace, remove it as told by your health care provider. ? Put ice in  a plastic bag. ? Place a towel between your skin and the bag. ? Leave the ice on for 20 minutes, 2-3 times a day. ? Remove the ice if your skin turns bright red. This is very important. If you cannot feel pain, heat, or cold, you have a greater risk of damage to the area.   Driving  If you were given a sedative during the procedure, it can affect you for several hours. Do not drive or operate machinery until your health care provider says that it is safe.  Ask your health care provider when it is safe to drive. General instructions  Take over-the-counter and prescription medicines only as told by your health care provider.  Do not use any products that contain nicotine or tobacco, such as cigarettes and e-cigarettes. These can delay bone healing. If you need help quitting, ask your health care provider.  If you have a back brace, wear it as told by your health care provider. Remove it only as told by your health care provider.  Keep all follow-up visits. This is important. Contact a health care provider if:  You have a fever.  Your pain is not controlled with medicine.  You have pain, numbness, or weakness that lasts longer than 3 weeks after surgery.  You become constipated.  You have more redness, swelling, or pain in your incision area.  You have fluid or blood coming from your incision.  Your incision feels warm to the touch.  You have pus or a bad smell coming from your incision. Get help right away if:  You have increasing pain, numbness, or weakness.  You lose control of when you urinate or have a bowel movement (incontinence).  You have chest pain.  You have trouble breathing.  You have pain or swelling in your lower leg.  You have confusion or are difficult to wake up. These symptoms may represent a serious problem that is an emergency. Do not wait to see if the symptoms will go away. Get medical help right away. Call your local emergency services (911 in the  [place] Do not drive yourself to the hospital. Summary  After a lumbar diskectomy, it is common to have pain in the lower back and numbness or weakness in the legs. These should improve in a short amount of time.  Be sure to check your incision every day for signs of infection. Change dressings and bathe as told by your health care provider.  Return to your normal activities as told by your health care provider and physical therapist. Avoid twisting, bending, or lifting until your health care provider approves.  Make sure you know which symptoms should cause you to contact your health care provider or to get help right away. This information is not intended to replace advice given to you by your health care provider. Make sure you discuss any questions you have with your  health care provider. Document Revised: 07/28/2019 Document Reviewed: 07/28/2019 Elsevier Patient Education  2021 [employer].

## 2020-10-11 ENCOUNTER — Other Ambulatory Visit: Payer: Self-pay | Admitting: Psychiatry

## 2020-10-11 ENCOUNTER — Telehealth: Payer: Self-pay

## 2020-10-11 DIAGNOSIS — F3342 Major depressive disorder, recurrent, in full remission: Secondary | ICD-10-CM

## 2020-10-11 MED ORDER — CLONAZEPAM 2 MG PO TABS: 2.0000 mg | ORAL_TABLET | Freq: Every day | ORAL | 2 refills | Status: DC

## 2020-10-11 NOTE — Telephone Encounter (Signed)
received a fax requeting a refill on the clonazepam

## 2020-10-11 NOTE — Telephone Encounter (Signed)
Ordered.   I have utilized the [place] Controlled Substances Reporting System (PMP [place]) to confirm adherence regarding the patient's medication. My review reveals appropriate prescription fills.

## 2020-10-20 ENCOUNTER — Encounter: Payer: Self-pay | Admitting: Psychiatry

## 2020-10-20 ENCOUNTER — Telehealth (INDEPENDENT_AMBULATORY_CARE_PROVIDER_SITE_OTHER): Payer: No Typology Code available for payment source | Admitting: Psychiatry

## 2020-10-20 ENCOUNTER — Other Ambulatory Visit: Payer: Self-pay

## 2020-10-20 DIAGNOSIS — F909 Attention-deficit hyperactivity disorder, unspecified type: Secondary | ICD-10-CM | POA: Diagnosis not present

## 2020-10-20 DIAGNOSIS — F3342 Major depressive disorder, recurrent, in full remission: Secondary | ICD-10-CM

## 2020-10-20 MED ORDER — AMPHETAMINE-DEXTROAMPHET ER 20 MG PO CP24: 40.0000 mg | ORAL_CAPSULE | ORAL | 0 refills | Status: DC

## 2020-10-20 MED ORDER — AMPHETAMINE-DEXTROAMPHET ER 20 MG PO CP24: 40.0000 mg | ORAL_CAPSULE | Freq: Every day | ORAL | 0 refills | Status: DC

## 2020-10-20 NOTE — Patient Instructions (Addendum)
1. Continue Adderall XR 40 mg daily   2. Increase sertraline 150 mg daily  3. Continue Bupropion 450 mg daily (150 mg + 300 mg ) 4. Continue clonazepam 2 mg at night as needed for anxiety  5. Next appointment: 9/9 at 9:30

## 2020-10-20 NOTE — Progress Notes (Signed)
Virtual Visit via Video Note  I connected with Jon Beck on 10/20/20 at  9:00 AM EDT by a video enabled telemedicine application and verified that I am speaking with the correct person using two identifiers.  Location: Patient: home Provider: office Persons participated in the visit- patient, provider    I discussed the limitations of evaluation and management by telemedicine and the availability of in person appointments. The patient expressed understanding and agreed to proceed.   I discussed the assessment and treatment plan with the patient. The patient was provided an opportunity to ask questions and all were answered. The patient agreed with the plan and demonstrated an understanding of the instructions.   The patient was advised to call back or seek an in-person evaluation if the symptoms worsen or if the condition fails to improve as anticipated.  I provided 10 minutes of non-face-to-face time during this encounter.   Jon Hotter, MD    [facility] MD/PA/NP OP Progress Note  10/20/2020 9:25 AM Jon Beck  MRN:  [id number]  Chief Complaint:  Chief Complaint   Depression; ADHD; Follow-up    HPI:  This is a follow-up appointment for depression and ADHD.  Per chart review, he had Abnormal MRI with large disc extrusion L4-L5 causing severe canal stenosis with cauda equina impingement:He underwent right L4 laminectomy, medial facetectomy for excision of herniated disc on 5/19 by Dr Maisie Fus  He states that he has been doing well.  He was admitted to the hospital for cauda equina impingement.  It came out of nowhere, and it was a scary experience.  Although he still has soreness, he denies any significant pain, and has been able to ambulate.  He works from home after about a week of leave.  He is [place].  He has been back on diet again.  He sleeps well.  He denies feeling depressed.  He denies anxiety or panic attacks.  He denies SI.  He has  been taking sertraline 100 mg daily instead of 150 mg; he tried this to come off the medication with the thought to try clonazepam ("harder one") later.  He agrees to first try to taper off clonazepam after being provided psychoeducation.  He denies any issues with focus.  He takes Adderall few times per week, and he has been able to still focus well.  He denies alcohol use or drug use.  Although he does not recognize any shortness of breath s/p COVID, he has not exerted himself since being discharged.   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 MDD (major depressive disorder), recurrent, in full remission (HCC)  F33.42     2. Attention deficit hyperactivity disorder (ADHD), unspecified ADHD type  F90.9       Past Psychiatric History: Please see initial evaluation for full details. I have reviewed the history. No updates at this time.     Past Medical History:  Past Medical History:  Diagnosis Date   CES (cauda equina syndrome) (HCC)    Hypertension     Past Surgical History:  Procedure Laterality Date   LUMBAR LAMINECTOMY/DECOMPRESSION MICRODISCECTOMY Right 09/07/2020   Procedure: LUMBAR LAMINECTOMY/DECOMPRESSION MICRODISCECTOMY LUMBAR FOUR-FIVE;  Surgeon: Bedelia Person, MD;  Location: [facility] OR;  Service: Neurosurgery;  Laterality: Right;    Family Psychiatric History: Please see initial evaluation for full details.  I have reviewed the history. No updates at this time.     Family History: No family history on file.  Social History:  Social History   Socioeconomic History   Marital status: Single    Spouse name: Not on file   Number of children: Not on file   Years of education: Not on file   Highest education level: Not on file  Occupational History   Not on file  Tobacco Use   Smoking status: Never   Smokeless tobacco: Never   Substance and Sexual Activity   Alcohol use: Yes    Comment: Socail    Drug use: No   Sexual activity: Not on file  Other Topics Concern   Not on file  Social History Narrative   Not on file   Social Determinants of Health   Financial Resource Strain: Not on file  Food Insecurity: Not on file  Transportation Needs: Not on file  Physical Activity: Not on file  Stress: Not on file  Social Connections: Not on file    Allergies: No Known Allergies  Metabolic Disorder Labs: No results found for: HGBA1C, MPG No results found for: PROLACTIN No results found for: CHOL, TRIG, HDL, CHOLHDL, VLDL, LDLCALC No results found for: TSH  Therapeutic Level Labs: No results found for: LITHIUM No results found for: VALPROATE No components found for:  CBMZ  Current Medications: Current Outpatient Medications  Medication Sig Dispense Refill   [START ON 10/31/2020] amphetamine-dextroamphetamine (ADDERALL XR) 20 MG 24 hr capsule Take 2 capsules (40 mg total) by mouth daily. 60 capsule 0   [START ON 12/01/2020] amphetamine-dextroamphetamine (ADDERALL XR) 20 MG 24 hr capsule Take 2 capsules (40 mg total) by mouth every morning. 60 capsule 0   buPROPion (WELLBUTRIN XL) 150 MG 24 hr tablet Take total of 450 mg daily (300 mg + 150 mg ) 90 tablet 1   clonazePAM (KLONOPIN) 2 MG tablet Take 1 tablet (2 mg total) by mouth at bedtime. 30 tablet 2   cyclobenzaprine (FLEXERIL) 10 MG tablet Take 1 tablet (10 mg total) by mouth 3 (three) times daily as needed for up to 30 doses for muscle spasms. 30 tablet 0   docusate sodium (COLACE) 100 MG capsule Take 1 capsule (100 mg total) by mouth 2 (two) times daily. 10 capsule 0   HYDROcodone-acetaminophen (NORCO/VICODIN) 5-325 MG tablet Take 1 tablet by mouth every 6 (six) hours as needed for up to 15 doses for severe pain. (Patient not taking: Reported on 10/20/2020) 15 tablet 0   ibuprofen (ADVIL,MOTRIN) 200 MG tablet Take 200 mg by mouth 3 (three) times daily as  needed.     levothyroxine (SYNTHROID, LEVOTHROID) 75 MCG tablet Take 75 mcg by mouth daily before breakfast. 1 whole tablet (weekdays Mon-Fri) 1.5 tabs on weekends (Sat-Sun)     sertraline (ZOLOFT) 100 MG tablet Take 1.5 tablets (150 mg total) by mouth at bedtime. 135 tablet 1   No current facility-administered medications for this visit.     Musculoskeletal: Strength & Muscle Tone:  N/A Gait & Station:  N/A Patient leans: N/A  Psychiatric Specialty Exam: Review of Systems  Psychiatric/Behavioral:  Negative for agitation, behavioral problems, confusion, decreased concentration, dysphoric mood, hallucinations, self-injury, sleep disturbance and suicidal ideas. The patient is not nervous/anxious and is not hyperactive.   All other systems reviewed and are negative.  There were no vitals taken for this visit.There is no height or weight on file to calculate BMI.  General Appearance: Fairly Groomed  Eye Contact:  Good  Speech:  Clear and Coherent  Volume:  Normal  Mood:   good  Affect:  Appropriate, Congruent, and euthymic  Thought Process:  Coherent and Goal Directed  Orientation:  Full (Time, Place, and Person)  Thought Content: Logical   Suicidal Thoughts:  No  Homicidal Thoughts:  No  Memory:  Immediate;   Good  Judgement:  Good  Insight:  Good  Psychomotor Activity:  Normal  Concentration:  Concentration: Good and Attention Span: Good  Recall:  Good  Fund of Knowledge: Good  Language: Good  Akathisia:  No  Handed:  Right  AIMS (if indicated): not done  Assets:  Communication Skills Desire for Improvement  ADL's:  Intact  Cognition: WNL  Sleep:  Good   Screenings: PHQ2-9    Flowsheet Row Video Visit from 10/20/2020 in [facility] Video Visit from 07/25/2020 in [facility]  PHQ-2 Total Score 0 0      Flowsheet Row Video Visit from 10/20/2020 in [facility] ED to Hosp-Admission  (Discharged) from 09/06/2020 in [facility] from 08/26/2020 in [facility]  C-SSRS RISK CATEGORY No Risk No Risk No Risk        Assessment and Plan:  Jon Beck is a 40 y.o. year old male with a history of depression,  ADHD,  hypertension,  hyperlipidemia, hypothyroidism, obesity, who presents for follow up appointment for below.   1. MDD (major depressive disorder), recurrent, in full remission (HCC) He denies significant mood symptoms since last visit.  Will continue sertraline and bupropion to target depression.  Noted that he self tapered sertraline, although he is willing to Titrate to the original dose.  Will continue clonazepam as needed for anxiety.  He is willing to try a lower dose after a month.   2. Attention deficit hyperactivity disorder (ADHD), unspecified ADHD type He had 3 psychological testing in the past, first diagnosed at age 70 according to the patient.  He reports good benefit from Adderall, which he takes few times per week.  Will continue current dose to target ADHD.   This clinician has discussed the side effect associated with medication prescribed during this encounter. Please refer to notes in the previous encounters for more details.    Plan I have reviewed and updated plans as below 1. Continue Adderall XR 40 mg daily   2. Increase sertraline 150 mg daily  3. Continue Bupropion 450 mg daily (150 mg + 300 mg ) 4. Continue clonazepam 2 mg at night as needed for anxiety  5. Next appointment: 9/9 at 9:30 for 30 mins, video     Past trials of medication: sertraline, Effexor (had "zaps"), Wellbutrin, Adderall, Adderall XR, Vyvanse, Concerta, Strattera,      The patient demonstrates the following risk factors for suicide: Chronic risk factors for suicide include: psychiatric disorder of depression. Acute risk factors for suicide include: N/A. Protective factors for this patient include:  positive social support, coping skills and hope for the future. Considering these factors, the overall suicide risk at this point appears to be low. Patient is appropriate for outpatient follow up., who presents for follow up appointment for below.       Jon Hotter, MD 10/20/2020, 9:25 AM

## 2020-12-27 NOTE — Progress Notes (Deleted)
BH MD/PA/NP OP Progress Note  12/27/2020 2:32 PM Jon Beck  MRN:  [id number]  Chief Complaint:  HPI: *** Visit Diagnosis: No diagnosis found.  Past Psychiatric History: Please see initial evaluation for full details. I have reviewed the history. No updates at this time.    Past Medical History:  Past Medical History:  Diagnosis Date   CES (cauda equina syndrome) (HCC)    Hypertension     Past Surgical History:  Procedure Laterality Date   LUMBAR LAMINECTOMY/DECOMPRESSION MICRODISCECTOMY Right 09/07/2020   Procedure: LUMBAR LAMINECTOMY/DECOMPRESSION MICRODISCECTOMY LUMBAR FOUR-FIVE;  Surgeon: Bedelia Person, MD;  Location: [facility] OR;  Service: Neurosurgery;  Laterality: Right;    Family Psychiatric History: Please see initial evaluation for full details. I have reviewed the history. No updates at this time.     Family History: No family history on file.  Social History:  Social History   Socioeconomic History   Marital status: Single    Spouse name: Not on file   Number of children: Not on file   Years of education: Not on file   Highest education level: Not on file  Occupational History   Not on file  Tobacco Use   Smoking status: Never   Smokeless tobacco: Never  Substance and Sexual Activity   Alcohol use: Yes    Comment: Socail    Drug use: No   Sexual activity: Not on file  Other Topics Concern   Not on file  Social History Narrative   Not on file   Social Determinants of Health   Financial Resource Strain: Not on file  Food Insecurity: Not on file  Transportation Needs: Not on file  Physical Activity: Not on file  Stress: Not on file  Social Connections: Not on file    Allergies: No Known Allergies  Metabolic Disorder Labs: No results found for: HGBA1C, MPG No results found for: PROLACTIN No results found for: CHOL, TRIG, HDL, CHOLHDL, VLDL, LDLCALC No results found for: TSH  Therapeutic Level Labs: No results found for: LITHIUM No  results found for: VALPROATE No components found for:  CBMZ  Current Medications: Current Outpatient Medications  Medication Sig Dispense Refill   amphetamine-dextroamphetamine (ADDERALL XR) 20 MG 24 hr capsule Take 2 capsules (40 mg total) by mouth daily. 60 capsule 0   amphetamine-dextroamphetamine (ADDERALL XR) 20 MG 24 hr capsule Take 2 capsules (40 mg total) by mouth every morning. 60 capsule 0   buPROPion (WELLBUTRIN XL) 150 MG 24 hr tablet Take total of 450 mg daily (300 mg + 150 mg ) 90 tablet 1   clonazePAM (KLONOPIN) 2 MG tablet Take 1 tablet (2 mg total) by mouth at bedtime. 30 tablet 2   cyclobenzaprine (FLEXERIL) 10 MG tablet Take 1 tablet (10 mg total) by mouth 3 (three) times daily as needed for up to 30 doses for muscle spasms. 30 tablet 0   docusate sodium (COLACE) 100 MG capsule Take 1 capsule (100 mg total) by mouth 2 (two) times daily. 10 capsule 0   HYDROcodone-acetaminophen (NORCO/VICODIN) 5-325 MG tablet Take 1 tablet by mouth every 6 (six) hours as needed for up to 15 doses for severe pain. (Patient not taking: Reported on 10/20/2020) 15 tablet 0   ibuprofen (ADVIL,MOTRIN) 200 MG tablet Take 200 mg by mouth 3 (three) times daily as needed.     levothyroxine (SYNTHROID, LEVOTHROID) 75 MCG tablet Take 75 mcg by mouth daily before breakfast. 1 whole tablet (weekdays Mon-Fri) 1.5 tabs on weekends (Sat-Sun)  sertraline (ZOLOFT) 100 MG tablet Take 1.5 tablets (150 mg total) by mouth at bedtime. 135 tablet 1   No current facility-administered medications for this visit.     Musculoskeletal: Strength & Muscle Tone:  N/A Gait & Station:  N?A Patient leans: N/A  Psychiatric Specialty Exam: Review of Systems  There were no vitals taken for this visit.There is no height or weight on file to calculate BMI.  General Appearance: {Appearance:22683}  Eye Contact:  {BHH EYE CONTACT:22684}  Speech:  Clear and Coherent  Volume:  Normal  Mood:  {BHH MOOD:22306}  Affect:   {Affect (PAA):22687}  Thought Process:  Coherent  Orientation:  Full (Time, Place, and Person)  Thought Content: Logical   Suicidal Thoughts:  {ST/HT (PAA):22692}  Homicidal Thoughts:  {ST/HT (PAA):22692}  Memory:  Immediate;   Good  Judgement:  {Judgement (PAA):22694}  Insight:  {Insight (PAA):22695}  Psychomotor Activity:  Normal  Concentration:  Concentration: Good and Attention Span: Good  Recall:  Good  Fund of Knowledge: Good  Language: Good  Akathisia:  No  Handed:  Right  AIMS (if indicated): not done  Assets:  Communication Skills Desire for Improvement  ADL's:  Intact  Cognition: WNL  Sleep:  {BHH GOOD/FAIR/POOR:22877}   Screenings: PHQ2-9    Flowsheet Row Video Visit from 10/20/2020 in [facility] Video Visit from 07/25/2020 in [facility]  PHQ-2 Total Score 0 0      Flowsheet Row Video Visit from 10/20/2020 in [facility] ED to Hosp-Admission (Discharged) from 09/06/2020 in [facility] from 08/26/2020 in [facility]  C-SSRS RISK CATEGORY No Risk No Risk No Risk        Assessment and Plan:  Jon Beck is a 40 y.o. year old male with a history of depression,  ADHD,  hypertension,  hyperlipidemia, hypothyroidism, obesity, who presents for follow up appointment for below.     1. MDD (major depressive disorder), recurrent, in full remission (HCC) He denies significant mood symptoms since last visit.  Will continue sertraline and bupropion to target depression.  Noted that he self tapered sertraline, although he is willing to Titrate to the original dose.  Will continue clonazepam as needed for anxiety.  He is willing to try a lower dose after a month.    2. Attention deficit hyperactivity disorder (ADHD), unspecified ADHD type He had 3 psychological testing in the past, first diagnosed at age 41 according to  the patient.  He reports good benefit from Adderall, which he takes few times per week.  Will continue current dose to target ADHD.    This clinician has discussed the side effect associated with medication prescribed during this encounter. Please refer to notes in the previous encounters for more details.     Plan  1. Continue Adderall XR 40 mg daily   2. Increase sertraline 150 mg daily  3. Continue Bupropion 450 mg daily (150 mg + 300 mg ) 4. Continue clonazepam 2 mg at night as needed for anxiety  5. Next appointment: 9/9 at 9:30 for 30 mins, video     Past trials of medication: sertraline, Effexor (had "zaps"), Wellbutrin, Adderall, Adderall XR, Vyvanse, Concerta, Strattera,      The patient demonstrates the following risk factors for suicide: Chronic risk factors for suicide include: psychiatric disorder of depression. Acute risk factors for suicide include: N/A. Protective factors for this patient include: positive social support, coping skills and  hope for the future. Considering these factors, the overall suicide risk at this point appears to be low. Patient is appropriate for outpatient follow up., who presents for follow up appointment for below.       [name], MD 12/27/2020, 2:32 PM

## 2020-12-29 ENCOUNTER — Telehealth: Payer: No Typology Code available for payment source | Admitting: Psychiatry

## 2021-01-01 NOTE — Progress Notes (Signed)
Virtual Visit via Video Note  I connected with Jon Beck on 01/03/21 at  1:30 PM EDT by a video enabled telemedicine application and verified that I am speaking with the correct person using two identifiers.  Location: Patient: home Provider: office Persons participated in the visit- patient, provider    I discussed the limitations of evaluation and management by telemedicine and the availability of in person appointments. The patient expressed understanding and agreed to proceed.   I discussed the assessment and treatment plan with the patient. The patient was provided an opportunity to ask questions and all were answered. The patient agreed with the plan and demonstrated an understanding of the instructions.   The patient was advised to call back or seek an in-person evaluation if the symptoms worsen or if the condition fails to improve as anticipated.  I provided 23 minutes of non-face-to-face time during this encounter.   Neysa Hottereina Teriann Livingood, MD    [facility]  01/03/2021 3:36 PM Jon Beck  MRN:  [id number]  Chief Complaint:  Chief Complaint   Follow-up; Depression; Other    HPI:  This is a follow-up appointment for depression and ADHD.  He states that he is having a rough days for the past few weeks.  He is under a lot of stress.  He talks about his mother, who was diagnosed with audio onset dementia.  His father has COPD, and has an aortic aneurysm.  He talks about his wife, who switched her job.  It caused him more stress and had a financial strain due to this.  His niece has moved in.  Although he denies any pressure due to this and enjoys her stay, he had a conversation with his brother/father of this niece so that she can stay with him.  He talks about the conflict with his brother since child. 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  He felt betrayed when he was contacted by HR regarding of "unapproved absence."  He has been feeling anxious  sometimes.  He feels depressed.  He reports anhedonia, stating that he neglected his boat, although he used to enjoy it a lot in the past.  He has good appetite.  He has difficulty in concentration.  He does not think this difficulty in concentration is secondary to ADHD.  He denies insomnia. There was a time he had SI, although he denies any intent or plan.  He literally ran away from the scene, and cried afterwards.  He has not had SI for the past week, and agrees to contact emergency resources if any worsening.  He has been taking medication consistently.  Although it will be contrary to what he wants (as his hope was to come off from his medication in the future), he is willing to try higher dose of sertraline at this time. He rarely drinks alcohol. He denies drug use.   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 MDD (major depressive disorder), recurrent episode, moderate (HCC)  F33.1     2. Attention deficit hyperactivity disorder (ADHD), unspecified ADHD type  F90.9     3. Anxiety state  F41.1       Past Psychiatric History: Please see initial evaluation for full details. I have reviewed the history. No updates at this time.     Past Medical History:  Past Medical History:  Diagnosis Date   CES (cauda equina syndrome) (HCC)    Hypertension     Past Surgical History:  Procedure Laterality Date   LUMBAR LAMINECTOMY/DECOMPRESSION MICRODISCECTOMY Right 09/07/2020   Procedure: LUMBAR LAMINECTOMY/DECOMPRESSION MICRODISCECTOMY LUMBAR FOUR-FIVE;  Surgeon: Bedelia Person, MD;  Location: [facility] OR;  Service: Neurosurgery;  Laterality: Right;    Family Psychiatric History: Please see initial evaluation for full details. I have reviewed the history. No updates at this time.     Family History: No family history on file.  Social History:  Social  History   Socioeconomic History   Marital status: Single    Spouse name: Not on file   Number of children: Not on file   Years of education: Not on file   Highest education level: Not on file  Occupational History   Not on file  Tobacco Use   Smoking status: Never   Smokeless tobacco: Never  Substance and Sexual Activity   Alcohol use: Yes    Comment: Socail    Drug use: No   Sexual activity: Not on file  Other Topics Concern   Not on file  Social History Narrative   Not on file   Social Determinants of Health   Financial Resource Strain: Not on file  Food Insecurity: Not on file  Transportation Needs: Not on file  Physical Activity: Not on file  Stress: Not on file  Social Connections: Not on file    Allergies: No Known Allergies  Metabolic Disorder Labs: No results found for: HGBA1C, MPG No results found for: PROLACTIN No results found for: CHOL, TRIG, HDL, CHOLHDL, VLDL, LDLCALC No results found for: TSH  Therapeutic Level Labs: No results found for: LITHIUM No results found for: VALPROATE No components found for:  CBMZ  Current Medications: Current Outpatient Medications  Medication Sig Dispense Refill   amphetamine-dextroamphetamine (ADDERALL XR) 20 MG 24 hr capsule Take 2 capsules (40 mg total) by mouth daily. 60 capsule 0   amphetamine-dextroamphetamine (ADDERALL XR) 20 MG 24 hr capsule Take 2 capsules (40 mg total) by mouth every morning. 60 capsule 0   buPROPion (WELLBUTRIN XL) 150 MG 24 hr tablet Take total of 450 mg daily (300 mg + 150 mg ) 90 tablet 1   clonazePAM (KLONOPIN) 2 MG tablet Take 1 tablet (2 mg total) by mouth at bedtime. 30 tablet 2   cyclobenzaprine (FLEXERIL) 10 MG tablet Take 1 tablet (10 mg total) by mouth 3 (three) times daily as needed for up to 30 doses for muscle spasms. 30 tablet 0   docusate sodium (COLACE) 100 MG capsule Take 1 capsule (100 mg total) by mouth 2 (two) times daily. 10 capsule 0   HYDROcodone-acetaminophen  (NORCO/VICODIN) 5-325 MG tablet Take 1 tablet by mouth every 6 (six) hours as needed for up to 15 doses for severe pain. (Patient not taking: Reported on 10/20/2020) 15 tablet 0   ibuprofen (ADVIL,MOTRIN) 200 MG tablet Take 200 mg by mouth 3 (three) times daily as needed.     levothyroxine (SYNTHROID, LEVOTHROID) 75 MCG tablet Take 75 mcg by mouth daily before breakfast. 1 whole tablet (weekdays Mon-Fri) 1.5 tabs on weekends (Sat-Sun)     sertraline (ZOLOFT) 100 MG tablet Take 1.5 tablets (150 mg total) by mouth at bedtime. 135 tablet 1   No current facility-administered medications for this visit.     Musculoskeletal: Strength & Muscle Tone:  N/A Gait & Station:  N/A Patient leans: N/A  Psychiatric Specialty Exam: Review of Systems  Psychiatric/Behavioral:  Positive for decreased concentration, dysphoric mood and suicidal ideas. Negative for agitation, behavioral problems, confusion, hallucinations, self-injury and sleep disturbance. The patient is nervous/anxious. The patient is not hyperactive.   All other systems reviewed and are negative.  There were no vitals taken for this visit.There is no height or weight on file to calculate BMI.  General Appearance: Fairly Groomed  Eye Contact:  Good  Speech:  Clear and Coherent  Volume:  Normal  Mood:  Anxious and Depressed  Affect:  Appropriate, Congruent, and tense  Thought Process:  Coherent  Orientation:  Full (Time, Place, and Person)  Thought Content: Logical   Suicidal Thoughts:  Yes.  without intent/plan  Homicidal Thoughts:  No  Memory:  Immediate;   Good  Judgement:  Good  Insight:  Good  Psychomotor Activity:  Normal  Concentration:  Concentration: Good and Attention Span: Good  Recall:  Good  Fund of Knowledge: Good  Language: Good  Akathisia:  No  Handed:  Right  AIMS (if indicated): not done  Assets:  Communication Skills Desire for Improvement  ADL's:  Intact  Cognition: WNL  Sleep:  Good   Screenings: PHQ2-9     Flowsheet Row Video Visit from 10/20/2020 in [facility] Video Visit from 07/25/2020 in [facility]  PHQ-2 Total Score 0 0      Flowsheet Row Video Visit from 01/03/2021 in [facility] Video Visit from 10/20/2020 in [facility] to Hosp-Admission (Discharged) from 09/06/2020 in [facility]  C-SSRS RISK CATEGORY Low Risk No Risk No Risk        Assessment and Plan:  Jon Beck is a 40 y.o. year old male with a history of depression,  ADHD,  hypertension,  hyperlipidemia, hypothyroidism, obesity who presents for follow up appointment for below.   1. MDD (major depressive disorder), recurrent episode, moderate (HCC) 3. Anxiety state There has been significant worsening in anxiety and depressive symptoms since the last visit.  Psychosocial stressors includes conflict with his manager, change in his wife's job, his parents medical condition, and conflict with his brother.  We uptitrate sertraline to optimize treatment for depression and then anxiety.  Will continue bupropion as adjunctive treatment for depression.  Will continue clonazepam as needed for anxiety.   2. Attention deficit hyperactivity disorder (ADHD), unspecified ADHD type He had 3 psychological testing in the past, first diagnosed at age 33 according to the patient. Although she reports worsening in inattention, it is likely more attributable to worsening in mood symptoms.  Will continue current dose of Adderall at this time to target ADHD.   This clinician has discussed the side effect associated with medication prescribed during this encounter. Please refer to notes in the previous encounters for more details.     Plan I have reviewed and updated plans as below  1. Continue Adderall XR 40 mg daily   2. Increase sertraline 200 mg daily  3. Continue Bupropion 450 mg daily (150  mg + 300 mg ) 4. Continue clonazepam 2 mg at night as needed for anxiety  5. Next appointment: 10/27 at 10:30 for 30 mins, video   Emergency resources which includes 911, ED, suicide crisis line [phone]) are discussed.     Past trials of medication: sertraline, Effexor (had "zaps"), Wellbutrin, Adderall, Adderall XR, Vyvanse, Concerta, Strattera,    I have reviewed suicide assessment in detail. No change in the following assessment.  The patient demonstrates the following risk factors for suicide: Chronic risk factors for suicide include: psychiatric disorder of depression. Acute risk factors for suicide include: N/A. Protective factors for this patient include: positive social support, coping skills and hope for the future. Considering these factors, the overall suicide risk at this point appears to be low. Patient is appropriate for outpatient follow up., who presents for follow up appointment for below.        [name], MD 01/03/2021, 3:36 PM

## 2021-01-03 ENCOUNTER — Other Ambulatory Visit: Payer: Self-pay

## 2021-01-03 ENCOUNTER — Telehealth (INDEPENDENT_AMBULATORY_CARE_PROVIDER_SITE_OTHER): Payer: No Typology Code available for payment source | Admitting: Psychiatry

## 2021-01-03 ENCOUNTER — Encounter: Payer: Self-pay | Admitting: Psychiatry

## 2021-01-03 DIAGNOSIS — F331 Major depressive disorder, recurrent, moderate: Secondary | ICD-10-CM

## 2021-01-03 DIAGNOSIS — F3342 Major depressive disorder, recurrent, in full remission: Secondary | ICD-10-CM

## 2021-01-03 DIAGNOSIS — F909 Attention-deficit hyperactivity disorder, unspecified type: Secondary | ICD-10-CM | POA: Diagnosis not present

## 2021-01-03 DIAGNOSIS — F411 Generalized anxiety disorder: Secondary | ICD-10-CM

## 2021-01-03 MED ORDER — CLONAZEPAM 2 MG PO TABS
2.0000 mg | ORAL_TABLET | Freq: Every day | ORAL | 2 refills | Status: DC
Start: 2021-01-14 — End: 2021-04-10

## 2021-01-03 MED ORDER — AMPHETAMINE-DEXTROAMPHET ER 20 MG PO CP24: 20.0000 mg | ORAL_CAPSULE | ORAL | 0 refills | Status: DC

## 2021-01-03 MED ORDER — SERTRALINE HCL 100 MG PO TABS: 200.0000 mg | ORAL_TABLET | Freq: Every day | ORAL | 0 refills | Status: DC

## 2021-01-03 NOTE — Patient Instructions (Addendum)
1. Continue Adderall XR 40 mg daily   2. Increase sertraline 200 mg daily  3. Continue Bupropion 450 mg daily (150 mg + 300 mg ) 4. Continue clonazepam 2 mg at night as needed for anxiety  5. Next appointment: 10/27 at 10:30   CONTACT INFORMATION  What to do if you need to get in touch with someone regarding a psychiatric issue:  1. EMERGENCY: For psychiatric emergencies (if you are suicidal or if there are any other safety issues) call 911 and/or go to your nearest Emergency Room immediately.   2. IF YOU NEED SOMEONE TO TALK TO RIGHT NOW: Given my clinical responsibilities, I may not be able to speak with you over the phone for a prolonged period of time.  A. You may always call The National Suicide Prevention Lifeline at 1-800-273-TALK 9313433753).  B. You may walk in to [facility]  Address: [address] [place], [place] [address], Phone: [phone].  Open 24/7, No appointment required. C. Your county of residence will also have local crisis services. For [facility]: [facility] at [phone] (24 Hour Crisis Hotline)

## 2021-01-04 ENCOUNTER — Telehealth: Payer: Self-pay

## 2021-01-04 NOTE — Telephone Encounter (Signed)
pharmacy notifed to cancel sertraline 150 mg and it  will be replace with ordering 200 mg this time, fyi

## 2021-01-30 ENCOUNTER — Telehealth: Payer: Self-pay

## 2021-01-30 ENCOUNTER — Other Ambulatory Visit: Payer: Self-pay | Admitting: Psychiatry

## 2021-01-30 MED ORDER — AMPHETAMINE-DEXTROAMPHET ER 20 MG PO CP24: 40.0000 mg | ORAL_CAPSULE | Freq: Every day | ORAL | 0 refills | Status: DC

## 2021-01-30 NOTE — Telephone Encounter (Signed)
Sorry, he is correct. Please advise him to take two caps of Adderral XR 20 mg to make 40 mg daily. New order was sent in to start on 10/17 as well.

## 2021-01-30 NOTE — Telephone Encounter (Signed)
pt called states that the adderall was sent in wrong.  he states that he been [place] it twice a daily. and it was only sent in for 1 time.

## 2021-02-13 NOTE — Progress Notes (Signed)
Virtual Visit via Video Note  I connected with Jon Beck on 02/15/21 at 10:30 AM EDT by a video enabled telemedicine application and verified that I am speaking with the correct person using two identifiers.  Location: Patient: home Provider: office Persons participated in the visit- patient, provider    I discussed the limitations of evaluation and management by telemedicine and the availability of in person appointments. The patient expressed understanding and agreed to proceed.   I discussed the assessment and treatment plan with the patient. The patient was provided an opportunity to ask questions and all were answered. The patient agreed with the plan and demonstrated an understanding of the instructions.   The patient was advised to call back or seek an in-person evaluation if the symptoms worsen or if the condition fails to improve as anticipated.  I provided 15 minutes of non-face-to-face time during this encounter.   [name], MD     [facility] MD/PA/NP OP Progress Note  02/15/2021 11:59 AM Jon Beck  MRN:  [id number]  Chief Complaint:  Chief Complaint   Depression; Follow-up    HPI:  This is a follow-up appointment for depression and ADHD.  He states that he has been doing well, and things are progressively better.  He accepted a new position, and will start in 2 weeks.  This is an advancement in his career.  He has been able to stay on top of things.  He was able to have communication with his wife, and things has been going better since then.  His niece continues to stay at his place.  He reports good relationship with her while he has not talked with his brother since the last time.  His parents have been doing good.  He states that he has not experienced that low in the past, and hopes not to have it again.  He feels comfortable to stay on the current medication regimen.  He sleeps well.  He denies feeling depressed or anhedonia.  He has started to work on diet,  and regular exercise consistently again.  He denies SI.  He takes clonazepam every day for insomnia/anxiety.  He has been able to focus well, and stay on tasks.  He tries not to take medication when he does not work as he is concerned of developing tolerance/dependence.  He denies alcohol use or drug use.    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 MDD (major depressive disorder), recurrent, in partial remission (HCC)  F33.41 sertraline (ZOLOFT) 100 MG tablet    2. Attention deficit hyperactivity disorder (ADHD), unspecified ADHD type  F90.9       Past Psychiatric History: Please see initial evaluation for full details. I have reviewed the history. No updates at this time.    Past Medical History:  Past Medical History:  Diagnosis Date   CES (cauda equina syndrome) (HCC)    Hypertension     Past Surgical History:  Procedure Laterality Date   LUMBAR LAMINECTOMY/DECOMPRESSION MICRODISCECTOMY Right 09/07/2020   Procedure: LUMBAR LAMINECTOMY/DECOMPRESSION MICRODISCECTOMY LUMBAR FOUR-FIVE;  Surgeon: Bedelia Person, MD;  Location: [facility] OR;  Service: Neurosurgery;  Laterality: Right;    Family Psychiatric History: Please see initial evaluation for full details. I have reviewed the history. No updates at this time.     Family History: No family history on file.  Social History:  Social History   Socioeconomic History   Marital status: Single    Spouse name: Not on file   Number of children: Not on file   Years of education: Not on file   Highest education level: Not on file  Occupational History   Not on file  Tobacco Use   Smoking status: Never   Smokeless tobacco: Never  Substance and Sexual Activity   Alcohol use: Yes    Comment: Socail    Drug use: No   Sexual activity: Not on file  Other Topics Concern   Not on file   Social History Narrative   Not on file   Social Determinants of Health   Financial Resource Strain: Not on file  Food Insecurity: Not on file  Transportation Needs: Not on file  Physical Activity: Not on file  Stress: Not on file  Social Connections: Not on file    Allergies: No Known Allergies  Metabolic Disorder Labs: No results found for: HGBA1C, MPG No results found for: PROLACTIN No results found for: CHOL, TRIG, HDL, CHOLHDL, VLDL, LDLCALC No results found for: TSH  Therapeutic Level Labs: No results found for: LITHIUM No results found for: VALPROATE No components found for:  CBMZ  Current Medications: Current Outpatient Medications  Medication Sig Dispense Refill   buPROPion (WELLBUTRIN XL) 300 MG 24 hr tablet Take 300 mg by mouth daily. Total of 450 mg daily     [START ON 03/09/2021] amphetamine-dextroamphetamine (ADDERALL XR) 20 MG 24 hr capsule Take 2 capsules (40 mg total) by mouth daily. 60 capsule 0   buPROPion (WELLBUTRIN XL) 150 MG 24 hr tablet Take total of 450 mg daily (300 mg + 150 mg ) 90 tablet 1   clonazePAM (KLONOPIN) 2 MG tablet Take 1 tablet (2 mg total) by mouth at bedtime. 30 tablet 2   cyclobenzaprine (FLEXERIL) 10 MG tablet Take 1 tablet (10 mg total) by mouth 3 (three) times daily as needed for up to 30 doses for muscle spasms. 30 tablet 0   docusate sodium (COLACE) 100 MG capsule Take 1 capsule (100 mg total) by mouth 2 (two) times daily. 10 capsule 0   HYDROcodone-acetaminophen (NORCO/VICODIN) 5-325 MG tablet Take 1 tablet by mouth every 6 (six) hours as needed for up to 15 doses for severe pain. (Patient not taking: Reported on 10/20/2020) 15 tablet 0   ibuprofen (ADVIL,MOTRIN) 200 MG tablet Take 200 mg by mouth 3 (three) times daily as needed.     levothyroxine (SYNTHROID, LEVOTHROID) 75 MCG tablet Take 75 mcg by mouth daily before breakfast. 1 whole tablet (weekdays Mon-Fri) 1.5 tabs on weekends (Sat-Sun)     [START ON 04/04/2021] sertraline  (ZOLOFT) 100 MG tablet Take 2 tablets (200 mg total) by mouth at bedtime. 180 tablet 0   No current facility-administered medications for this visit.     Musculoskeletal: Strength & Muscle Tone:  N/A Gait & Station:  N/A Patient leans: N/A  Psychiatric Specialty Exam: Review of Systems  Psychiatric/Behavioral:  Negative for agitation, behavioral problems, confusion, decreased concentration, dysphoric mood, hallucinations, self-injury, sleep disturbance and suicidal ideas. The patient is not nervous/anxious and is not hyperactive.   All other systems reviewed and are negative.  There were no vitals taken for this visit.There is no height or weight on file to calculate BMI.  General Appearance: Fairly Groomed  Eye Contact:  Good  Speech:  Clear and Coherent  Volume:  Normal  Mood:   good  Affect:  Appropriate, Congruent, and  calm, euthymic  Thought Process:  Coherent  Orientation:  Full (Time, Place, and Person)  Thought Content: Logical   Suicidal Thoughts:  No  Homicidal Thoughts:  No  Memory:  Immediate;   Good  Judgement:  Good  Insight:  Good  Psychomotor Activity:  Normal  Concentration:  Concentration: Good and Attention Span: Good  Recall:  Good  Fund of Knowledge: Good  Language: Good  Akathisia:  No  Handed:  Right  AIMS (if indicated): not done  Assets:  Communication Skills Desire for Improvement  ADL's:  Intact  Cognition: WNL  Sleep:  Good   Screenings: PHQ2-9    Flowsheet Row Video Visit from 10/20/2020 in [facility] Video Visit from 07/25/2020 in [facility]  PHQ-2 Total Score 0 0      Flowsheet Row Video Visit from 01/03/2021 in [facility] Video Visit from 10/20/2020 in [facility] ED to Hosp-Admission (Discharged) from 09/06/2020 in [facility]  C-SSRS RISK CATEGORY Low Risk No Risk No Risk         Assessment and Plan:  Jon Beck is a 40 y.o. year old male with a history of depression,  ADHD,  hypertension,  hyperlipidemia, hypothyroidism, obesity, who presents for follow up appointment for below.   1. MDD (major depressive disorder), recurrent, in partial remission (HCC) There has been significant improvement in depressive symptoms and anxiety since up titration of sertraline.  She reports good relationship with his wife, and is looking forward to starting a new position at work.  Will continue current dose of sertraline to target depression and anxiety.  Will continue bupropion as adjunctive treatment for depression.  Will continue clonazepam for anxiety.   2. Attention deficit hyperactivity disorder (ADHD), unspecified ADHD type He had 3 psychological testing in the past, first diagnosed at age 4 according to the patient.  He reports good benefit from Adderall.  We will continue current dose to target ADHD symptoms.   This clinician has discussed the side effect associated with medication prescribed during this encounter. Please refer to notes in the previous encounters for more details.      Plan Continue sertraline 200 mg daily Continue bupropion 450 mg daily (150 mg + 300 mg )- per patient, he recently picked up 90 days med Continue clonazepam 2 mg at night as needed for anxiety Continue Adderall XR 40 mg daily Next appointment: 12/13 at 4 PM for 30 mins, video    Past trials of medication: sertraline, Effexor (had "zaps"), Wellbutrin, Adderall, Adderall XR, Vyvanse, Concerta, Strattera,    The patient demonstrates the following risk factors for suicide: Chronic risk factors for suicide include: psychiatric disorder of depression. Acute risk factors for suicide include: N/A. Protective factors for this patient include: positive social support, coping skills and hope for the future. Considering these factors, the overall suicide risk at this point appears to be low.  Patient is appropriate for outpatient follow up., who presents for follow up appointment for below.         [name], MD 02/15/2021, 11:59 AM

## 2021-02-15 ENCOUNTER — Telehealth (INDEPENDENT_AMBULATORY_CARE_PROVIDER_SITE_OTHER): Payer: No Typology Code available for payment source | Admitting: Psychiatry

## 2021-02-15 ENCOUNTER — Encounter: Payer: Self-pay | Admitting: Psychiatry

## 2021-02-15 ENCOUNTER — Other Ambulatory Visit: Payer: Self-pay

## 2021-02-15 DIAGNOSIS — F3341 Major depressive disorder, recurrent, in partial remission: Secondary | ICD-10-CM | POA: Diagnosis not present

## 2021-02-15 DIAGNOSIS — F909 Attention-deficit hyperactivity disorder, unspecified type: Secondary | ICD-10-CM

## 2021-02-15 MED ORDER — SERTRALINE HCL 100 MG PO TABS: 200.0000 mg | ORAL_TABLET | Freq: Every day | ORAL | 0 refills | Status: DC

## 2021-02-15 MED ORDER — AMPHETAMINE-DEXTROAMPHET ER 20 MG PO CP24: 40.0000 mg | ORAL_CAPSULE | Freq: Every day | ORAL | 0 refills | Status: DC

## 2021-02-15 NOTE — Patient Instructions (Signed)
Continue sertraline 200 mg daily Continue bupropion 450 mg daily (150 mg + 300 mg ) Continue clonazepam 2 mg at night as needed for anxiety Continue Adderall XR 40 mg daily Next appointment: 12/13 at 4 PM

## 2021-03-29 NOTE — Progress Notes (Deleted)
[facility] MD/PA/NP OP Progress Note  03/29/2021 10:27 AM Bralen Wiltgen  MRN:  [id number]  Chief Complaint:  HPI: *** Visit Diagnosis: No diagnosis found.  Past Psychiatric History: Please see initial evaluation for full details. I have reviewed the history. No updates at this time.     Past Medical History:  Past Medical History:  Diagnosis Date   CES (cauda equina syndrome) (HCC)    Hypertension     Past Surgical History:  Procedure Laterality Date   LUMBAR LAMINECTOMY/DECOMPRESSION MICRODISCECTOMY Right 09/07/2020   Procedure: LUMBAR LAMINECTOMY/DECOMPRESSION MICRODISCECTOMY LUMBAR FOUR-FIVE;  Surgeon: Bedelia Person, MD;  Location: [facility] OR;  Service: Neurosurgery;  Laterality: Right;    Family Psychiatric History: Please see initial evaluation for full details. I have reviewed the history. No updates at this time.     Family History: No family history on file.  Social History:  Social History   Socioeconomic History   Marital status: Single    Spouse name: Not on file   Number of children: Not on file   Years of education: Not on file   Highest education level: Not on file  Occupational History   Not on file  Tobacco Use   Smoking status: Never   Smokeless tobacco: Never  Substance and Sexual Activity   Alcohol use: Yes    Comment: Socail    Drug use: No   Sexual activity: Not on file  Other Topics Concern   Not on file  Social History Narrative   Not on file   Social Determinants of Health   Financial Resource Strain: Not on file  Food Insecurity: Not on file  Transportation Needs: Not on file  Physical Activity: Not on file  Stress: Not on file  Social Connections: Not on file    Allergies: No Known Allergies  Metabolic Disorder Labs: No results found for: HGBA1C, MPG No results found for: PROLACTIN No results found for: CHOL, TRIG, HDL, CHOLHDL, VLDL, LDLCALC No results found for: TSH  Therapeutic Level Labs: No results found for: LITHIUM No  results found for: VALPROATE No components found for:  CBMZ  Current Medications: Current Outpatient Medications  Medication Sig Dispense Refill   amphetamine-dextroamphetamine (ADDERALL XR) 20 MG 24 hr capsule Take 2 capsules (40 mg total) by mouth daily. 60 capsule 0   buPROPion (WELLBUTRIN XL) 150 MG 24 hr tablet Take total of 450 mg daily (300 mg + 150 mg ) 90 tablet 1   buPROPion (WELLBUTRIN XL) 300 MG 24 hr tablet Take 300 mg by mouth daily. Total of 450 mg daily     clonazePAM (KLONOPIN) 2 MG tablet Take 1 tablet (2 mg total) by mouth at bedtime. 30 tablet 2   cyclobenzaprine (FLEXERIL) 10 MG tablet Take 1 tablet (10 mg total) by mouth 3 (three) times daily as needed for up to 30 doses for muscle spasms. 30 tablet 0   docusate sodium (COLACE) 100 MG capsule Take 1 capsule (100 mg total) by mouth 2 (two) times daily. 10 capsule 0   HYDROcodone-acetaminophen (NORCO/VICODIN) 5-325 MG tablet Take 1 tablet by mouth every 6 (six) hours as needed for up to 15 doses for severe pain. (Patient not taking: Reported on 10/20/2020) 15 tablet 0   ibuprofen (ADVIL,MOTRIN) 200 MG tablet Take 200 mg by mouth 3 (three) times daily as needed.     levothyroxine (SYNTHROID, LEVOTHROID) 75 MCG tablet Take 75 mcg by mouth daily before breakfast. 1 whole tablet (weekdays Mon-Fri) 1.5 tabs on weekends (Sat-Sun)     [  START ON 04/04/2021] sertraline (ZOLOFT) 100 MG tablet Take 2 tablets (200 mg total) by mouth at bedtime. 180 tablet 0   No current facility-administered medications for this visit.     Musculoskeletal: Strength & Muscle Tone:  N/A Gait & Station:  N/A Patient leans: N/A  Psychiatric Specialty Exam: Review of Systems  There were no vitals taken for this visit.There is no height or weight on file to calculate BMI.  General Appearance: {Appearance:22683}  Eye Contact:  {BHH EYE CONTACT:22684}  Speech:  Clear and Coherent  Volume:  Normal  Mood:  {BHH MOOD:22306}  Affect:  {Affect  (PAA):22687}  Thought Process:  Coherent  Orientation:  Full (Time, Place, and Person)  Thought Content: Logical   Suicidal Thoughts:  {ST/HT (PAA):22692}  Homicidal Thoughts:  {ST/HT (PAA):22692}  Memory:  Immediate;   Good  Judgement:  {Judgement (PAA):22694}  Insight:  {Insight (PAA):22695}  Psychomotor Activity:  Normal  Concentration:  Concentration: Good and Attention Span: Good  Recall:  Good  Fund of Knowledge: Good  Language: Good  Akathisia:  No  Handed:  Right  AIMS (if indicated): not done  Assets:  Communication Skills Desire for Improvement  ADL's:  Intact  Cognition: WNL  Sleep:  {B[facility] GOOD/FAIR/POOR:22877}   Screenings: PHQ2-9    Flowsheet Row Video Visit from 10/20/2020 in [facility] Video Visit from 07/25/2020 in [facility]  PHQ-2 Total Score 0 0      Flowsheet Row Video Visit from 01/03/2021 in [facility] Video Visit from 10/20/2020 in [facility] ED to Hosp-Admission (Discharged) from 09/06/2020 in [facility]  C-SSRS RISK CATEGORY Low Risk No Risk No Risk        Assessment and Plan:  Myking Sar is a 40 y.o. year old male with a history of depression,  ADHD,  hypertension,  hyperlipidemia, hypothyroidism, obesity, who presents for follow up appointment for below.   1. MDD (major depressive disorder), recurrent, in partial remission (HCC) There has been significant improvement in depressive symptoms and anxiety since up titration of sertraline.  She reports good relationship with his wife, and is looking forward to starting a new position at work.  Will continue current dose of sertraline to target depression and anxiety.  Will continue bupropion as adjunctive treatment for depression.  Will continue clonazepam for anxiety.    2. Attention deficit hyperactivity disorder (ADHD), unspecified ADHD type He  had 3 psychological testing in the past, first diagnosed at age 22 according to the patient.  He reports good benefit from Adderall.  We will continue current dose to target ADHD symptoms.    This clinician has discussed the side effect associated with medication prescribed during this encounter. Please refer to notes in the previous encounters for more details.       Plan Continue sertraline 200 mg daily Continue bupropion 450 mg daily (150 mg + 300 mg )- per patient, he recently picked up 90 days med Continue clonazepam 2 mg at night as needed for anxiety Continue Adderall XR 40 mg daily Next appointment: 12/13 at 4 PM for 30 mins, video     Past trials of medication: sertraline, Effexor (had "zaps"), Wellbutrin, Adderall, Adderall XR, Vyvanse, Concerta, Strattera,    The patient demonstrates the following risk factors for suicide: Chronic risk factors for suicide include: psychiatric disorder of depression. Acute risk factors for suicide include: N/A. Protective factors for this patient include: positive social support,  coping skills and hope for the future. Considering these factors, the overall suicide risk at this point appears to be low. Patient is appropriate for outpatient follow up., who presents for follow up appointment for below.       [name], MD 03/29/2021, 10:27 AM

## 2021-04-03 ENCOUNTER — Telehealth: Payer: No Typology Code available for payment source | Admitting: Psychiatry

## 2021-04-10 ENCOUNTER — Telehealth: Payer: Self-pay

## 2021-04-10 DIAGNOSIS — F3342 Major depressive disorder, recurrent, in full remission: Secondary | ICD-10-CM

## 2021-04-10 DIAGNOSIS — F909 Attention-deficit hyperactivity disorder, unspecified type: Secondary | ICD-10-CM

## 2021-04-10 MED ORDER — CLONAZEPAM 2 MG PO TABS
2.0000 mg | ORAL_TABLET | Freq: Every day | ORAL | 0 refills | Status: DC
Start: 2021-04-20 — End: 2021-08-20

## 2021-04-10 MED ORDER — BUPROPION HCL ER (XL) 150 MG PO TB24: ORAL_TABLET | ORAL | 0 refills | Status: DC

## 2021-04-10 MED ORDER — AMPHETAMINE-DEXTROAMPHET ER 20 MG PO CP24
40.0000 mg | ORAL_CAPSULE | Freq: Every day | ORAL | 0 refills | Status: DC
Start: 2021-04-10 — End: 2021-06-01

## 2021-04-10 NOTE — Telephone Encounter (Signed)
pt made appt for the 05-04-21 with Dr. Vanetta Shawl but he needs enough medications to get to his appt.

## 2021-04-10 NOTE — Telephone Encounter (Signed)
I have sent Wellbutrin 150 mg, Adderall XR 20 mg, Klonopin-limited supply to pharmacy.  We will route this message to Dr. Vanetta Shawl to address further refills once she is back.

## 2021-04-12 ENCOUNTER — Other Ambulatory Visit: Payer: Self-pay | Admitting: Psychiatry

## 2021-04-12 DIAGNOSIS — F909 Attention-deficit hyperactivity disorder, unspecified type: Secondary | ICD-10-CM

## 2021-04-12 MED ORDER — CLONAZEPAM 2 MG PO TABS: 2.0000 mg | ORAL_TABLET | Freq: Two times a day (BID) | ORAL | 0 refills | Status: DC

## 2021-04-12 MED ORDER — AMPHETAMINE-DEXTROAMPHET ER 20 MG PO CP24: 40.0000 mg | ORAL_CAPSULE | Freq: Every day | ORAL | 0 refills | Status: DC

## 2021-04-12 NOTE — Telephone Encounter (Signed)
Ordered

## 2021-04-17 ENCOUNTER — Telehealth: Payer: Self-pay

## 2021-04-17 NOTE — Telephone Encounter (Signed)
Medication management - Telephone call with pt, after he left a message the wrong dosage was sent in for his Addderall XR 20 mg, 2 a day, #60.  Informed this was sent in correctly by Dr. Vanetta Shawl on 04/12/21. Pt. had picked up an order prior for just #30, which is why pharmacy was not allowing him to pick up the new order yet, due to too early but they do have the prescription on file and it is correct for #60.  Message left for patient the order was at his [facility] for when can be filled and that the order is correct per Dr. Bing Matter last notes and orders.

## 2021-05-02 NOTE — Progress Notes (Signed)
Virtual Visit via Video Note  I connected with Jon Beck on 05/04/21 at  8:30 AM EST by a video enabled telemedicine application and verified that I am speaking with the correct person using two identifiers.  Location: Patient: home Provider: office Persons participated in the visit- patient, provider    I discussed the limitations of evaluation and management by telemedicine and the availability of in person appointments. The patient expressed understanding and agreed to proceed.     I discussed the assessment and treatment plan with the patient. The patient was provided an opportunity to ask questions and all were answered. The patient agreed with the plan and demonstrated an understanding of the instructions.   The patient was advised to call back or seek an in-person evaluation if the symptoms worsen or if the condition fails to improve as anticipated.  I provided 18 minutes of non-face-to-face time during this encounter.   [name], MD    [facility] MD/PA/NP OP Progress Note  05/04/2021 9:14 AM Jon Beck  MRN:  [id number]  Chief Complaint:  Chief Complaint   Depression; Follow-up; ADHD    HPI:  This is a follow-up appointment for depression and ADHD.  He states that he had a COVID.  He denies lingering symptoms except dysgeusia.  He had a difficult time during holiday.  His mother had an episode of hypoglycemia and mini stroke.  He is planning to visit his parents later today.  He reports very close relationship especially with his mother, who he talks with at least 30 minutes every day.  He states that his mood has been fine despite this.  He has been struggling with focus.  He has not been able to take Adderall XR as it is backorder.  He notices that it has been difficult for him to focus.  He has had a challenges in working on several projects.  He likes the new job so far.  He likes his colleagues as well.  He is willing to try Adderall IR with the hope to get back  on XR soon.  He denies insomnia.  He denies feeling depressed or anxiety.  He has slightly decrease in appetite, which he mainly attributes to dysgeusia.  He has fair energy.  He denies SI.  He denies panic attacks.  He does clonazepam every day for anxiety.    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 MDD (major depressive disorder), recurrent, in full remission (HCC)  F33.42     2. Attention deficit hyperactivity disorder (ADHD), unspecified ADHD type  F90.9       Past Psychiatric History: Please see initial evaluation for full details. I have reviewed the history. No updates at this time.     Past Medical History:  Past Medical History:  Diagnosis Date   CES (cauda equina syndrome) (HCC)    Hypertension     Past Surgical History:  Procedure Laterality Date   LUMBAR LAMINECTOMY/DECOMPRESSION MICRODISCECTOMY Right 09/07/2020   Procedure: LUMBAR LAMINECTOMY/DECOMPRESSION MICRODISCECTOMY LUMBAR FOUR-FIVE;  Surgeon: Bedelia Person, MD;  Location: [facility] OR;  Service: Neurosurgery;  Laterality: Right;    Family Psychiatric History: Please see initial evaluation for full details. I have reviewed the history. No updates at this time.     Family History: No family history on file.  Social History:  Social History  Socioeconomic History   Marital status: Single    Spouse name: Not on file   Number of children: Not on file   Years of education: Not on file   Highest education level: Not on file  Occupational History   Not on file  Tobacco Use   Smoking status: Never   Smokeless tobacco: Never  Substance and Sexual Activity   Alcohol use: Yes    Comment: Socail    Drug use: No   Sexual activity: Not on file  Other Topics Concern   Not on file  Social History Narrative   Not on file   Social Determinants of Health    Financial Resource Strain: Not on file  Food Insecurity: Not on file  Transportation Needs: Not on file  Physical Activity: Not on file  Stress: Not on file  Social Connections: Not on file    Allergies: No Known Allergies  Metabolic Disorder Labs: No results found for: HGBA1C, MPG No results found for: PROLACTIN No results found for: CHOL, TRIG, HDL, CHOLHDL, VLDL, LDLCALC No results found for: TSH  Therapeutic Level Labs: No results found for: LITHIUM No results found for: VALPROATE No components found for:  CBMZ  Current Medications: Current Outpatient Medications  Medication Sig Dispense Refill   amphetamine-dextroamphetamine (ADDERALL) 15 MG tablet Take 1 tablet by mouth 2 (two) times daily. 60 tablet 0   amphetamine-dextroamphetamine (ADDERALL XR) 20 MG 24 hr capsule Take 2 capsules (40 mg total) by mouth daily for 15 days. 30 capsule 0   amphetamine-dextroamphetamine (ADDERALL XR) 20 MG 24 hr capsule Take 2 capsules (40 mg total) by mouth daily. 60 capsule 0   buPROPion (WELLBUTRIN XL) 150 MG 24 hr tablet Take total of 450 mg daily (300 mg + 150 mg ) 90 tablet 0   buPROPion (WELLBUTRIN XL) 300 MG 24 hr tablet Take 1 tablet (300 mg total) by mouth daily. Take total of 450 mg daily, along with 150 mg tab 90 tablet 0   clonazePAM (KLONOPIN) 2 MG tablet Take 1 tablet (2 mg total) by mouth at bedtime. 10 tablet 0   [START ON 05/13/2021] clonazePAM (KLONOPIN) 2 MG tablet Take 1 tablet (2 mg total) by mouth at bedtime. 30 tablet 0   cyclobenzaprine (FLEXERIL) 10 MG tablet Take 1 tablet (10 mg total) by mouth 3 (three) times daily as needed for up to 30 doses for muscle spasms. 30 tablet 0   docusate sodium (COLACE) 100 MG capsule Take 1 capsule (100 mg total) by mouth 2 (two) times daily. 10 capsule 0   HYDROcodone-acetaminophen (NORCO/VICODIN) 5-325 MG tablet Take 1 tablet by mouth every 6 (six) hours as needed for up to 15 doses for severe pain. (Patient not taking: Reported on  10/20/2020) 15 tablet 0   ibuprofen (ADVIL,MOTRIN) 200 MG tablet Take 200 mg by mouth 3 (three) times daily as needed.     levothyroxine (SYNTHROID, LEVOTHROID) 75 MCG tablet Take 75 mcg by mouth daily before breakfast. 1 whole tablet (weekdays Mon-Fri) 1.5 tabs on weekends (Sat-Sun)     sertraline (ZOLOFT) 100 MG tablet Take 2 tablets (200 mg total) by mouth at bedtime. 180 tablet 0   No current facility-administered medications for this visit.     Musculoskeletal: Strength & Muscle Tone:  N/A Gait & Station:  N/A Patient leans: N/A  Psychiatric Specialty Exam: Review of Systems  Psychiatric/Behavioral:  Positive for decreased concentration. Negative for agitation, behavioral problems, confusion, dysphoric mood, hallucinations, self-injury, sleep disturbance and suicidal  ideas. The patient is nervous/anxious. The patient is not hyperactive.   All other systems reviewed and are negative.  There were no vitals taken for this visit.There is no height or weight on file to calculate BMI.  General Appearance: Fairly Groomed  Eye Contact:  Good  Speech:  Clear and Coherent  Volume:  Normal  Mood:   good  Affect:  Appropriate, Congruent, and euthymic  Thought Process:  Coherent  Orientation:  Full (Time, Place, and Person)  Thought Content: Logical   Suicidal Thoughts:  No  Homicidal Thoughts:  No  Memory:  Immediate;   Good  Judgement:  Good  Insight:  Good  Psychomotor Activity:  Normal  Concentration:  Concentration: Good and Attention Span: Good  Recall:  Good  Fund of Knowledge: Good  Language: Good  Akathisia:  No  Handed:  Right  AIMS (if indicated): not done  Assets:  Communication Skills Desire for Improvement  ADL's:  Intact  Cognition: WNL  Sleep:  Good   Screenings: PHQ2-9    Flowsheet Row Video [facility] Video Visit from 07/25/2020 [facility]  PHQ-2 Total Score 0 0       Flowsheet Row [facility] Video [facility] Hosp[facility]Admission (Discharged) from [facility]  C-SSRS RISK CATEGORY Low Risk No Risk No Risk        Assessment and Plan:  Jon Beck is a 41 y.o. year old male with a history of depression,  ADHD,  hypertension,  hyperlipidemia, hypothyroidism, obesity, who presents for follow up appointment for below.    1. MDD (major depressive disorder), recurrent, in full remission (HCC) He denies significant mood symptoms since the last visit despite recent medical issues of his mother.  Will continue current dose of sertraline and bupropion to target depression and anxiety.  Will continue clonazepam as needed for anxiety.   2. Attention deficit hyperactivity disorder (ADHD), unspecified ADHD type He had 3 psychological testing in the past, first diagnosed at age 41 according to the patient.  Although he has had a good benefit from Adderall XR, it is now back ordered, and has had worsening in concentration since he is out of the medication.  Will start Adderall IR to target this with the hope that he can get XR soon.  Discussed potential risks, which includes but not limited to dependence, headache, perpetration, worsening in anxiety and insomnia.  He was advised to contact the office if any concern about the medication.       Plan Continue sertraline 200 mg daily Continue bupropion 450 mg daily (150 mg + 300 mg )- per patient, he recently picked up 90 days med Continue clonazepam 2 mg at night as needed for anxiety Start Adderall IR 15 mg twice a day (was on Adderall XR 40 mg daily, which is no back order) Next appointment: 2/10 at 9 AM for 30 mins, video     Past trials of medication: sertraline, Effexor (had "zaps"), Wellbutrin, Adderall, Adderall XR, Vyvanse, Concerta, Strattera,     The patient demonstrates the following risk factors for suicide: Chronic risk factors for suicide include: psychiatric disorder of depression. Acute risk factors for suicide include: N/A. Protective factors for this patient include: positive social support, coping skills and hope for the future. Considering these factors, the overall suicide risk at this point  appears to be low. Patient is appropriate for outpatient follow up., who presents for follow up appointment for below.       Neysa Hottereina Zameer Borman, MD 05/04/2021, 9:14 AM

## 2021-05-04 ENCOUNTER — Telehealth: Payer: Self-pay

## 2021-05-04 ENCOUNTER — Telehealth (INDEPENDENT_AMBULATORY_CARE_PROVIDER_SITE_OTHER): Payer: No Typology Code available for payment source | Admitting: Psychiatry

## 2021-05-04 ENCOUNTER — Other Ambulatory Visit: Payer: Self-pay

## 2021-05-04 ENCOUNTER — Encounter: Payer: Self-pay | Admitting: Psychiatry

## 2021-05-04 DIAGNOSIS — F3342 Major depressive disorder, recurrent, in full remission: Secondary | ICD-10-CM | POA: Diagnosis not present

## 2021-05-04 DIAGNOSIS — F909 Attention-deficit hyperactivity disorder, unspecified type: Secondary | ICD-10-CM

## 2021-05-04 MED ORDER — CLONAZEPAM 2 MG PO TABS: 2.0000 mg | ORAL_TABLET | Freq: Every day | ORAL | 0 refills | Status: DC

## 2021-05-04 MED ORDER — AMPHETAMINE-DEXTROAMPHETAMINE 15 MG PO TABS: 15.0000 mg | ORAL_TABLET | Freq: Two times a day (BID) | ORAL | 0 refills | Status: DC

## 2021-05-04 MED ORDER — BUPROPION HCL ER (XL) 300 MG PO TB24: 300.0000 mg | ORAL_TABLET | Freq: Every day | ORAL | 0 refills | Status: DC

## 2021-05-04 NOTE — Patient Instructions (Signed)
Continue sertraline 200 mg daily Continue bupropion 450 mg daily (150 mg + 300 mg ) Continue clonazepam 2 mg at night as needed for anxiety Start Addrall IR 15 mg twice a day  Next appointment: 2/10 at 9 AM

## 2021-05-04 NOTE — Telephone Encounter (Signed)
pt called left message that a prior auth was needed for the adderall

## 2021-05-04 NOTE — Telephone Encounter (Signed)
Dr.Hisada's patient °

## 2021-05-04 NOTE — Telephone Encounter (Signed)
went online and prior auth notice was in [email or web address].  submitted the prior auth - pending

## 2021-05-04 NOTE — Telephone Encounter (Signed)
Amphetamine -dextroamphetamin was approved from 05-04-21 to 05-03-24

## 2021-05-30 NOTE — Progress Notes (Signed)
Virtual Visit via Video Note  I connected with Jon Beck on 06/01/21 at  9:00 AM EST by a video enabled telemedicine application and verified that I am speaking with the correct person using two identifiers.  Location: Patient: home Provider: office Persons participated in the visit- patient, provider    I discussed the limitations of evaluation and management by telemedicine and the availability of in person appointments. The patient expressed understanding and agreed to proceed.    I discussed the assessment and treatment plan with the patient. The patient was provided an opportunity to ask questions and all were answered. The patient agreed with the plan and demonstrated an understanding of the instructions.   The patient was advised to call back or seek an in-person evaluation if the symptoms worsen or if the condition fails to improve as anticipated.  I provided 15 minutes of non-face-to-face time during this encounter.   [name], MD    [facility] MD/PA/NP OP Progress Note  06/01/2021 9:30 AM Jon Beck  MRN:  [id number]  Chief Complaint:  Chief Complaint   Depression; Follow-up; ADHD    HPI:  This is a follow up appointment for depression, ADHD.  He states that he was more anxious when he took Adderall IR.  It did not work for his attention either. he has not taken this medication for the past week with the thought to wait for this appointment.  Although it has been slow at work lately, he will start a project in a week.  He is easily distracted, and more irritable.  He has not been able to work on diet or exercise, and is planning to do so next week.  His niece has been doing good.  Although he feels down due to his anxiety, he denies significant concerns about this.  He enjoys time with his fiance at home.  He denies insomnia.  He denies panic attacks.  He takes clonazepam every night for anxiety/sleep.  He denies alcohol use or drug use.  He is willing to try Ritalin  at this time.   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 MDD (major depressive disorder), recurrent, in full remission (HCC)  F33.42     2. Attention deficit hyperactivity disorder (ADHD), unspecified ADHD type  F90.9     3. Anxiety state  F41.1       Past Psychiatric History: Please see initial evaluation for full details. I have reviewed the history. No updates at this time.     Past Medical History:  Past Medical History:  Diagnosis Date   CES (cauda equina syndrome) (HCC)    Hypertension     Past Surgical History:  Procedure Laterality Date   LUMBAR LAMINECTOMY/DECOMPRESSION MICRODISCECTOMY Right 09/07/2020   Procedure: LUMBAR LAMINECTOMY/DECOMPRESSION MICRODISCECTOMY LUMBAR FOUR-FIVE;  Surgeon: Bedelia Person, MD;  Location: [facility] OR;  Service: Neurosurgery;  Laterality: Right;    Family Psychiatric History: Please see initial evaluation for full details. I have reviewed the history. No updates at this time.     Family History: No family history on file.  Social History:  Social History   Socioeconomic History   Marital status: Single    Spouse name: Not on file   Number of children: Not on file   Years of education: Not on file   Highest education level: Not on file  Occupational History   Not on file  Tobacco Use   Smoking status: Never   Smokeless tobacco: Never  Substance and Sexual Activity   Alcohol use: Yes    Comment: Socail    Drug use: No   Sexual activity: Not on file  Other Topics Concern   Not on file  Social History Narrative   Not on file   Social Determinants of Health   Financial Resource Strain: Not on file  Food Insecurity: Not on file  Transportation Needs: Not on file  Physical Activity: Not on file  Stress: Not on file  Social Connections: Not on file    Allergies: No Known  Allergies  Metabolic Disorder Labs: No results found for: HGBA1C, MPG No results found for: PROLACTIN No results found for: CHOL, TRIG, HDL, CHOLHDL, VLDL, LDLCALC No results found for: TSH  Therapeutic Level Labs: No results found for: LITHIUM No results found for: VALPROATE No components found for:  CBMZ  Current Medications: Current Outpatient Medications  Medication Sig Dispense Refill   methylphenidate (RITALIN) 20 MG tablet Take 1 tablet (20 mg total) by mouth 2 (two) times daily with breakfast and lunch. 60 tablet 0   buPROPion (WELLBUTRIN XL) 150 MG 24 hr tablet Take total of 450 mg daily (300 mg + 150 mg ) 90 tablet 0   buPROPion (WELLBUTRIN XL) 300 MG 24 hr tablet Take 1 tablet (300 mg total) by mouth daily. Take total of 450 mg daily, along with 150 mg tab 90 tablet 0   clonazePAM (KLONOPIN) 2 MG tablet Take 1 tablet (2 mg total) by mouth at bedtime. 10 tablet 0   [START ON 06/20/2021] clonazePAM (KLONOPIN) 2 MG tablet Take 1 tablet (2 mg total) by mouth at bedtime. 30 tablet 2   cyclobenzaprine (FLEXERIL) 10 MG tablet Take 1 tablet (10 mg total) by mouth 3 (three) times daily as needed for up to 30 doses for muscle spasms. 30 tablet 0   docusate sodium (COLACE) 100 MG capsule Take 1 capsule (100 mg total) by mouth 2 (two) times daily. 10 capsule 0   HYDROcodone-acetaminophen (NORCO/VICODIN) 5-325 MG tablet Take 1 tablet by mouth every 6 (six) hours as needed for up to 15 doses for severe pain. (Patient not taking: Reported on 10/20/2020) 15 tablet 0   ibuprofen (ADVIL,MOTRIN) 200 MG tablet Take 200 mg by mouth 3 (three) times daily as needed.     levothyroxine (SYNTHROID, LEVOTHROID) 75 MCG tablet Take 75 mcg by mouth daily before breakfast. 1 whole tablet (weekdays Mon-Fri) 1.5 tabs on weekends (Sat-Sun)     sertraline (ZOLOFT) 100 MG tablet Take 2 tablets (200 mg total) by mouth at bedtime. 180 tablet 0   No current facility-administered medications for this visit.      Musculoskeletal: Strength & Muscle Tone:  N/A Gait & Station:  N/A Patient leans: N/A  Psychiatric Specialty Exam: Review of Systems  Psychiatric/Behavioral:  Positive for decreased concentration. Negative for agitation, behavioral problems, confusion, dysphoric mood, hallucinations, self-injury, sleep disturbance and suicidal ideas. The patient is nervous/anxious. The patient is not hyperactive.   All other systems reviewed and are negative.  There were no vitals taken for this visit.There is no height or weight on file to calculate BMI.  General Appearance: Fairly Groomed  Eye Contact:  Good  Speech:  Clear and Coherent  Volume:  Normal  Mood:  Anxious  Affect:  Appropriate, Congruent, and slightly down  Thought Process:  Coherent  Orientation:  Full (Time,  Place, and Person)  Thought Content: Logical   Suicidal Thoughts:  No  Homicidal Thoughts:  No  Memory:  Immediate;   Good  Judgement:  Good  Insight:  Good  Psychomotor Activity:  Normal  Concentration:  Concentration: Good and Attention Span: Good  Recall:  Good  Fund of Knowledge: Good  Language: Good  Akathisia:  No  Handed:  Right  AIMS (if indicated): not done  Assets:  Communication Skills Desire for Improvement  ADL's:  Intact  Cognition: WNL  Sleep:  Good   Screenings: PHQ2-9    Flowsheet Row Video Visit [facility]2020 in [facility] Video Visit from 07/25/2020 in [facility]  PHQ-2 Total Score 0 0      Flowsheet Row Video Visit from 01/03/2021 in [facility] Video Visit from 10/20/2020 in [facility] Hosp-Admission (Discharged) from 09/06/2020 in [facility]  C-SSRS RISK CATEGORY Low Risk No Risk No Risk        Assessment and Plan:  Jon Beck is a 41 y.o. year old male with a history of depression,  ADHD,  hypertension,  hyperlipidemia,  hypothyroidism, obesity, who presents for follow up appointment for below.    1. MDD (major depressive disorder), recurrent, in full remission (HCC) 3. Anxiety state He reports worsening in anxiety in the context of starting Adderall IR, and otherwise denies depressive symptoms.  Will continue current dose of sertraline and bupropion as maintenance treatment for depression.  Will continue clonazepam as needed for anxiety.   2. Attention deficit hyperactivity disorder (ADHD), unspecified ADHD type He had adverse reaction of worsening in anxiety with limited benefit from Adderall IR.  Noted that although he used to be on Adderall XR, this medication needs to be changed due to shortage of this medication.  Will try Ritalin to target ADHD.  Discussed potential risks, which includes but not limited to dependence, headache, palpitation, worsening in anxiety and insomnia.      Plan Continue sertraline 200 mg daily Continue bupropion 450 mg daily (150 mg + 300 mg )- per patient, he recently picked up 90 days med Continue clonazepam 2 mg at night as needed for anxiety Discontinue Adderall IR (15 mg caused worsening in anxiety) Start Ritalin 20 mg twice a day  Next appointment: 3/10 at 8:30 for 30 mins, video     Past trials of medication: sertraline, Effexor (had "zaps"), Wellbutrin, Adderall, Adderall XR, Vyvanse, Concerta, Strattera (limited benefit),    The patient demonstrates the following risk factors for suicide: Chronic risk factors for suicide include: psychiatric disorder of depression. Acute risk factors for suicide include: N/A. Protective factors for this patient include: positive social support, coping skills and hope for the future. Considering these factors, the overall suicide risk at this point appears to be low. Patient is appropriate for outpatient follow up., who presents for follow up appointment for below.       [name], MD 06/01/2021, 9:30 AM

## 2021-06-01 ENCOUNTER — Encounter: Payer: Self-pay | Admitting: Psychiatry

## 2021-06-01 ENCOUNTER — Telehealth (INDEPENDENT_AMBULATORY_CARE_PROVIDER_SITE_OTHER): Payer: No Typology Code available for payment source | Admitting: Psychiatry

## 2021-06-01 ENCOUNTER — Other Ambulatory Visit: Payer: Self-pay

## 2021-06-01 DIAGNOSIS — F909 Attention-deficit hyperactivity disorder, unspecified type: Secondary | ICD-10-CM | POA: Diagnosis not present

## 2021-06-01 DIAGNOSIS — F3342 Major depressive disorder, recurrent, in full remission: Secondary | ICD-10-CM

## 2021-06-01 DIAGNOSIS — F411 Generalized anxiety disorder: Secondary | ICD-10-CM | POA: Diagnosis not present

## 2021-06-01 MED ORDER — CLONAZEPAM 2 MG PO TABS: 2.0000 mg | ORAL_TABLET | Freq: Every day | ORAL | 2 refills | Status: DC

## 2021-06-01 MED ORDER — METHYLPHENIDATE HCL 20 MG PO TABS: 20.0000 mg | ORAL_TABLET | Freq: Two times a day (BID) | ORAL | 0 refills | Status: DC

## 2021-06-01 NOTE — Patient Instructions (Addendum)
Continue sertraline 200 mg daily Continue bupropion 450 mg daily (150 mg + 300 mg) Continue clonazepam 2 mg at night as needed for anxiety Discontinue Adderall IR  Start Ritalin 20 mg twice a day  Next appointment: 3/10 at 8:30

## 2021-06-04 ENCOUNTER — Telehealth: Payer: Self-pay

## 2021-06-04 NOTE — Telephone Encounter (Signed)
pt called states that he needed a prior auth.

## 2021-06-04 NOTE — Telephone Encounter (Signed)
pt called states that he needed a prior auth. -  pending

## 2021-06-05 NOTE — Telephone Encounter (Signed)
received fax that prior auth for the methylphenidate is approved from 2-13--23 to 06-03-24

## 2021-06-27 NOTE — Progress Notes (Deleted)
BH MD/PA/NP OP Progress Note ? ?06/27/2021 11:08 AM ?Jon Beck  ?MRN:  [id number] ? ?Chief Complaint: No chief complaint on file. ? ?HPI: *** ?Visit Diagnosis: No diagnosis found. ? ?Past Psychiatric History: Please see initial evaluation for full details. I have reviewed the history. No updates at this time.  ?  ? ?Past Medical History:  ?Past Medical History:  ?Diagnosis Date  ? CES (cauda equina syndrome) (HCC)   ? Hypertension   ?  ?Past Surgical History:  ?Procedure Laterality Date  ? LUMBAR LAMINECTOMY/DECOMPRESSION MICRODISCECTOMY Right 09/07/2020  ? Procedure: LUMBAR LAMINECTOMY/DECOMPRESSION MICRODISCECTOMY LUMBAR FOUR-FIVE;  Surgeon: Bedelia Person, MD;  Location: [facility] OR;  Service: Neurosurgery;  Laterality: Right;  ? ? ?Family Psychiatric History: Please see initial evaluation for full details. I have reviewed the history. No updates at this time.  ?  ? ?Family History: No family history on file. ? ?Social History:  ?Social History  ? ?Socioeconomic History  ? Marital status: Single  ?  Spouse name: Not on file  ? Number of children: Not on file  ? Years of education: Not on file  ? Highest education level: Not on file  ?Occupational History  ? Not on file  ?Tobacco Use  ? Smoking status: Never  ? Smokeless tobacco: Never  ?Substance and Sexual Activity  ? Alcohol use: Yes  ?  Comment: Socail   ? Drug use: No  ? Sexual activity: Not on file  ?Other Topics Concern  ? Not on file  ?Social History Narrative  ? Not on file  ? ?Social Determinants of Health  ? ?Financial Resource Strain: Not on file  ?Food Insecurity: Not on file  ?Transportation Needs: Not on file  ?Physical Activity: Not on file  ?Stress: Not on file  ?Social Connections: Not on file  ? ? ?Allergies: No Known Allergies ? ?Metabolic Disorder Labs: ?No results found for: HGBA1C, MPG ?No results found for: PROLACTIN ?No results found for: CHOL, TRIG, HDL, CHOLHDL, VLDL, LDLCALC ?No results found for: TSH ? ?Therapeutic Level Labs: ?No  results found for: LITHIUM ?No results found for: VALPROATE ?No components found for:  CBMZ ? ?Current Medications: ?Current Outpatient Medications  ?Medication Sig Dispense Refill  ? buPROPion (WELLBUTRIN XL) 150 MG 24 hr tablet Take total of 450 mg daily (300 mg + 150 mg ) 90 tablet 0  ? buPROPion (WELLBUTRIN XL) 300 MG 24 hr tablet Take 1 tablet (300 mg total) by mouth daily. Take total of 450 mg daily, along with 150 mg tab 90 tablet 0  ? clonazePAM (KLONOPIN) 2 MG tablet Take 1 tablet (2 mg total) by mouth at bedtime. 10 tablet 0  ? clonazePAM (KLONOPIN) 2 MG tablet Take 1 tablet (2 mg total) by mouth at bedtime. 30 tablet 2  ? cyclobenzaprine (FLEXERIL) 10 MG tablet Take 1 tablet (10 mg total) by mouth 3 (three) times daily as needed for up to 30 doses for muscle spasms. 30 tablet 0  ? docusate sodium (COLACE) 100 MG capsule Take 1 capsule (100 mg total) by mouth 2 (two) times daily. 10 capsule 0  ? HYDROcodone-acetaminophen (NORCO/VICODIN) 5-325 MG tablet Take 1 tablet by mouth every 6 (six) hours as needed for up to 15 doses for severe pain. (Patient not taking: Reported on 10/20/2020) 15 tablet 0  ? ibuprofen (ADVIL,MOTRIN) 200 MG tablet Take 200 mg by mouth 3 (three) times daily as needed.    ? levothyroxine (SYNTHROID, LEVOTHROID) 75 MCG tablet Take 75 mcg by mouth  daily before breakfast. 1 whole tablet (weekdays Mon-Fri) ?1.5 tabs on weekends (Sat-Sun)    ? methylphenidate (RITALIN) 20 MG tablet Take 1 tablet (20 mg total) by mouth 2 (two) times daily with breakfast and lunch. 60 tablet 0  ? sertraline (ZOLOFT) 100 MG tablet Take 2 tablets (200 mg total) by mouth at bedtime. 180 tablet 0  ? ?No current facility-administered medications for this visit.  ? ? ? ?Musculoskeletal: ?Strength & Muscle Tone:  N/A ?Gait & Station:  N/A ?Patient leans: N/A ? ?Psychiatric Specialty Exam: ?Review of Systems  ?There were no vitals taken for this visit.There is no height or weight on file to calculate BMI.  ?General  Appearance: {Appearance:22683}  ?Eye Contact:  {B[facility] EYE CONTACT:22684}  ?Speech:  Clear and Coherent  ?Volume:  Normal  ?Mood:  {B[facility] MOOD:22306}  ?Affect:  {Affect (PAA):22687}  ?Thought Process:  Coherent  ?Orientation:  Full (Time, Place, and Person)  ?Thought Content: Logical   ?Suicidal Thoughts:  {ST/HT (PAA):22692}  ?Homicidal Thoughts:  {ST/HT (PAA):22692}  ?Memory:  Immediate;   Good  ?Judgement:  {Judgement (PAA):22694}  ?Insight:  {Insight (PAA):22695}  ?Psychomotor Activity:  Normal  ?Concentration:  Concentration: Good and Attention Span: Good  ?Recall:  Good  ?Fund of Knowledge: Good  ?Language: Good  ?Akathisia:  No  ?Handed:  Right  ?AIMS (if indicated): not done  ?Assets:  Communication Skills ?Desire for Improvement  ?ADL's:  Intact  ?Cognition: WNL  ?Sleep:  {[facility] GOOD/FAIR/POOR:22877}  ? ?Screenings: ?PHQ2-9   ? ?Flowsheet Row [facility] Visit from [facility]/25/2020 [facility]  ?PHQ-2 Total Score 0 0  ? ?  ? ?Flowsheet Row Video Visit from 01/03/2021 in [facility] to Hosp[facility]Admission (Discharged) [facility]  ?C-SSRS RISK CATEGORY Low Risk No Risk No Risk  ? ?  ? ? ? ?Assessment and Plan:  ?Jon Crutchshley Heffley is a 41 y.o. year old male with a history of depression,  ADHD,  hypertension,  hyperlipidemia, hypothyroidism, obesity, who presents for follow up appointment for below.  ?  ?1. MDD (major depressive disorder), recurrent, in full remission (HCC) ?3. Anxiety state ?He reports worsening in anxiety in the context of starting Adderall IR, and otherwise denies depressive symptoms.  Will continue current dose of sertraline and bupropion as maintenance treatment for depression.  Will continue clonazepam as needed for anxiety.  ?  ?2. Attention deficit  hyperactivity disorder (ADHD), unspecified ADHD type ?He had adverse reaction of worsening in anxiety with limited benefit from Adderall IR.  Noted that although he used to be on Adderall XR, this medication needs to be changed due to shortage of this medication.  Will try Ritalin to target ADHD.  Discussed potential risks, which includes but not limited to dependence, headache, palpitation, worsening in anxiety and insomnia.  ?  ?  ?Plan ?Continue sertraline 200 mg daily ?Continue bupropion 450 mg daily (150 mg + 300 mg )- per patient, he recently picked up 90 days med ?Continue clonazepam 2 mg at night as needed for anxiety ?Discontinue Adderall IR (15 mg caused worsening in anxiety) ?Start Ritalin 20 mg twice a day  ?Next appointment: 3/10 at 8:30 for 30 mins, video ?  ?  ?Past trials of medication: sertraline, Effexor (had "zaps"), Wellbutrin, Adderall, Adderall XR, Vyvanse, Concerta, Strattera (limited benefit),  ?  ?The patient  demonstrates the following risk factors for suicide: Chronic risk factors for suicide include: psychiatric disorder of depression. Acute risk factors for suicide include: N/A. Protective factors for this patient include: positive social support, coping skills and hope for the future. Considering these factors, the overall suicide risk at this point appears to be low. Patient is appropriate for outpatient follow up., who presents for follow up appointment for below.  ?  ?  ? ?Collaboration of Care: Collaboration of Care: {BH OP Collaboration of IRSW:[id number]} ? ?Patient/Guardian was advised Release of Information must be obtained prior to any record release in order to collaborate their care with an outside provider. Patient/Guardian was advised if they have not already done so to contact the registration department to sign all necessary forms in order for [place] to release information regarding their care.  ? ?Consent: Patient/Guardian gives verbal consent for treatment and assignment of  benefits for services provided during this visit. Patient/Guardian expressed understanding and agreed to proceed.  ? ? ?[name], MD ?06/27/2021, 11:08 AM ? ?

## 2021-06-29 ENCOUNTER — Telehealth: Payer: No Typology Code available for payment source | Admitting: Psychiatry

## 2021-07-08 ENCOUNTER — Other Ambulatory Visit: Payer: Self-pay | Admitting: Psychiatry

## 2021-07-08 DIAGNOSIS — F3341 Major depressive disorder, recurrent, in partial remission: Secondary | ICD-10-CM

## 2021-07-08 NOTE — Telephone Encounter (Signed)
I noticed that he was rescheduled his next appointment in June. I will need to see him at least every three months due to the medication he is on. Could you advise him to reschedule sometime in May (for 30 mins)? Thanks.

## 2021-07-11 ENCOUNTER — Other Ambulatory Visit: Payer: Self-pay | Admitting: Psychiatry

## 2021-07-11 ENCOUNTER — Telehealth: Payer: Self-pay

## 2021-07-11 DIAGNOSIS — F3342 Major depressive disorder, recurrent, in full remission: Secondary | ICD-10-CM

## 2021-07-11 DIAGNOSIS — F3341 Major depressive disorder, recurrent, in partial remission: Secondary | ICD-10-CM

## 2021-07-11 MED ORDER — METHYLPHENIDATE HCL 20 MG PO TABS: 20.0000 mg | ORAL_TABLET | Freq: Two times a day (BID) | ORAL | 0 refills | Status: DC

## 2021-07-11 MED ORDER — SERTRALINE HCL 100 MG PO TABS: 200.0000 mg | ORAL_TABLET | Freq: Every day | ORAL | 0 refills | Status: DC

## 2021-07-11 MED ORDER — BUPROPION HCL ER (XL) 150 MG PO TB24: ORAL_TABLET | ORAL | 0 refills | Status: DC

## 2021-07-11 NOTE — Telephone Encounter (Signed)
pt left message that he needs refills on the zoloft, ritalin and wellbutrins  ?

## 2021-07-11 NOTE — Telephone Encounter (Signed)
Lvm to sch appt °

## 2021-07-11 NOTE — Telephone Encounter (Signed)
Ordered. As I previous sent you a message, please contact the patient to make an appointment in May (for 30 mins). I will need to see him at least every three months given the medication he is on.

## 2021-07-13 NOTE — Telephone Encounter (Signed)
Pt was set up with an appt ?

## 2021-07-19 ENCOUNTER — Telehealth: Payer: Self-pay | Admitting: Nurse Practitioner

## 2021-07-19 ENCOUNTER — Ambulatory Visit: Payer: No Typology Code available for payment source | Admitting: Nurse Practitioner

## 2021-07-19 NOTE — Telephone Encounter (Signed)
Pt was a no show for his NP app with Lauren on 07/19/21, I sent a no show letter.  ?

## 2021-07-19 NOTE — Progress Notes (Deleted)
? ?New Patient Office Visit ? ?Subjective:  ?Patient ID: Jon Beck, male    DOB: [birth year]  Age: 41 y.o. MRN: [id number] ? ?CC: No chief complaint on file. ? ? ?HPI ?Jon Beck presents for new patient visit to establish care.  Introduced to Publishing rights manager role and practice setting.  All questions answered.  Discussed provider/patient relationship and expectations. ? ? ?Past Medical History:  ?Diagnosis Date  ? CES (cauda equina syndrome) (HCC)   ? Hypertension   ? ? ?Past Surgical History:  ?Procedure Laterality Date  ? LUMBAR LAMINECTOMY/DECOMPRESSION MICRODISCECTOMY Right 09/07/2020  ? Procedure: LUMBAR LAMINECTOMY/DECOMPRESSION MICRODISCECTOMY LUMBAR FOUR-FIVE;  Surgeon: Bedelia Person, MD;  Location: [facility] OR;  Service: Neurosurgery;  Laterality: Right;  ? ? ?No family history on file. ? ?Social History  ? ?Socioeconomic History  ? Marital status: Single  ?  Spouse name: Not on file  ? Number of children: Not on file  ? Years of education: Not on file  ? Highest education level: Not on file  ?Occupational History  ? Not on file  ?Tobacco Use  ? Smoking status: Never  ? Smokeless tobacco: Never  ?Substance and Sexual Activity  ? Alcohol use: Yes  ?  Comment: Socail   ? Drug use: No  ? Sexual activity: Not on file  ?Other Topics Concern  ? Not on file  ?Social History Narrative  ? Not on file  ? ?Social Determinants of Health  ? ?Financial Resource Strain: Not on file  ?Food Insecurity: Not on file  ?Transportation Needs: Not on file  ?Physical Activity: Not on file  ?Stress: Not on file  ?Social Connections: Not on file  ?Intimate Partner Violence: Not on file  ? ? ?ROS ?Review of Systems ? ?Objective:  ? ?Today's Vitals: There were no vitals taken for this visit. ? ?Physical Exam ? ?Assessment & Plan:  ? ?Problem List Items Addressed This Visit   ?None ? ? ?Outpatient Encounter Medications as of 07/19/2021  ?Medication Sig  ? buPROPion (WELLBUTRIN XL) 150 MG 24 hr tablet Take total of 450 mg daily  (300 mg + 150 mg )  ? buPROPion (WELLBUTRIN XL) 300 MG 24 hr tablet Take 1 tablet (300 mg total) by mouth daily. Take total of 450 mg daily, along with 150 mg tab  ? clonazePAM (KLONOPIN) 2 MG tablet Take 1 tablet (2 mg total) by mouth at bedtime.  ? clonazePAM (KLONOPIN) 2 MG tablet Take 1 tablet (2 mg total) by mouth at bedtime.  ? cyclobenzaprine (FLEXERIL) 10 MG tablet Take 1 tablet (10 mg total) by mouth 3 (three) times daily as needed for up to 30 doses for muscle spasms.  ? docusate sodium (COLACE) 100 MG capsule Take 1 capsule (100 mg total) by mouth 2 (two) times daily.  ? HYDROcodone-acetaminophen (NORCO/VICODIN) 5-325 MG tablet Take 1 tablet by mouth every 6 (six) hours as needed for up to 15 doses for severe pain. (Patient not taking: Reported on 10/20/2020)  ? ibuprofen (ADVIL,MOTRIN) 200 MG tablet Take 200 mg by mouth 3 (three) times daily as needed.  ? levothyroxine (SYNTHROID, LEVOTHROID) 75 MCG tablet Take 75 mcg by mouth daily before breakfast. 1 whole tablet (weekdays Mon-Fri) ?1.5 tabs on weekends (Sat-Sun)  ? methylphenidate (RITALIN) 20 MG tablet Take 1 tablet (20 mg total) by mouth 2 (two) times daily with breakfast and lunch.  ? methylphenidate (RITALIN) 20 MG tablet Take 1 tablet (20 mg total) by mouth 2 (two) times daily.  ? [START  ON 08/10/2021] methylphenidate (RITALIN) 20 MG tablet Take 1 tablet (20 mg total) by mouth 2 (two) times daily.  ? sertraline (ZOLOFT) 100 MG tablet Take 2 tablets (200 mg total) by mouth at bedtime.  ? ?No facility-administered encounter medications on file as of 07/19/2021.  ? ? ?Follow-up: No follow-ups on file.  ? ?Gerre Scull, NP ? ?

## 2021-07-31 NOTE — Telephone Encounter (Signed)
1st no show, fee waived ?

## 2021-08-20 ENCOUNTER — Other Ambulatory Visit: Payer: Self-pay | Admitting: Psychiatry

## 2021-08-20 ENCOUNTER — Telehealth: Payer: Self-pay

## 2021-08-20 DIAGNOSIS — F3342 Major depressive disorder, recurrent, in full remission: Secondary | ICD-10-CM

## 2021-08-20 MED ORDER — BUPROPION HCL ER (XL) 300 MG PO TB24: 300.0000 mg | ORAL_TABLET | Freq: Every day | ORAL | 1 refills | Status: DC

## 2021-08-20 MED ORDER — METHYLPHENIDATE HCL 20 MG PO TABS: 20.0000 mg | ORAL_TABLET | Freq: Two times a day (BID) | ORAL | 0 refills | Status: DC

## 2021-08-20 MED ORDER — CLONAZEPAM 2 MG PO TABS: 2.0000 mg | ORAL_TABLET | Freq: Every day | ORAL | 1 refills | Status: DC

## 2021-08-20 NOTE — Telephone Encounter (Signed)
pt called left message that he needs refills on his medication he needs his wellbutrins , clonazepam, methylphenidate  ?

## 2021-08-20 NOTE — Telephone Encounter (Signed)
Ordered.   I have utilized the [place] Controlled Substances Reporting System (PMP AWARxE) to confirm adherence regarding the patient's medication. My review reveals appropriate prescription fills.

## 2021-09-24 NOTE — Progress Notes (Signed)
Virtual Visit via Video Note  I connected with Jon Beck on 09/28/21 at  8:00 AM EDT by a video enabled telemedicine application and verified that I am speaking with the correct person using two identifiers.  Location: Patient: car Provider: office Persons participated in the visit- patient, provider    I discussed the limitations of evaluation and management by telemedicine and the availability of in person appointments. The patient expressed understanding and agreed to proceed.    I discussed the assessment and treatment plan with the patient. The patient was provided an opportunity to ask questions and all were answered. The patient agreed with the plan and demonstrated an understanding of the instructions.   The patient was advised to call back or seek an in-person evaluation if the symptoms worsen or if the condition fails to improve as anticipated.  I provided 20 minutes of non-face-to-face time during this encounter.   [name], MD    [facility] MD/PA/NP OP Progress Note  09/28/2021 8:32 AM Jon Beck  MRN:  [id number]  Chief Complaint:  Chief Complaint  Patient presents with   Follow-up   Depression   ADHD   HPI:  This is a follow-up appointment for depression and an ADHD.  He states that he has been doing very well.  He reports good relationship with his wife, although they have been busy due to work, and fixing their rental house.  He reports concern about his niece, who he has found has been cutting again.  Although she states that she needs to see a therapist, she does not follow through.  He is open to discuss with her if she feels more comfortable if he were to come to her appointment together.  He thinks he feels in control since being on Ritalin.  He has been able to focus well without any side effect.  He denies any issues at work.  He denies irritability as he used to feel when he was on Adderall.  He sleeps well as long as he is on Klonopin.  Although he is  interested in tapering off this medication in the future, he would like to try it after is situation is getting settled.  He denies change in appetite.  He has started to the regular exercise again.  He denies feeling depressed or anxiety.  He denies SI.  He feels comfortable to stay on his current medication.     Visit Diagnosis:    ICD-10-CM   1. Attention deficit hyperactivity disorder (ADHD), unspecified ADHD type  F90.9     2. MDD (major depressive disorder), recurrent, in full remission (HCC)  F33.42 buPROPion (WELLBUTRIN XL) 150 MG 24 hr tablet    clonazePAM (KLONOPIN) 2 MG tablet    3. Anxiety  F41.9 sertraline (ZOLOFT) 100 MG tablet      Past Psychiatric History: Please see initial evaluation for full details. I have reviewed the history. No updates at this time.     Past Medical History:  Past Medical History:  Diagnosis Date   CES (cauda equina syndrome) (HCC)    Hypertension     Past Surgical History:  Procedure Laterality Date   LUMBAR LAMINECTOMY/DECOMPRESSION MICRODISCECTOMY Right 09/07/2020   Procedure: LUMBAR LAMINECTOMY/DECOMPRESSION MICRODISCECTOMY LUMBAR FOUR-FIVE;  Surgeon: Bedelia Person, MD;  Location: [facility] OR;  Service: Neurosurgery;  Laterality: Right;    Family Psychiatric History: Please see initial evaluation for full details. I have reviewed the history. No updates at this time.     Family History:  No family history on file.  Social History:  Social History   Socioeconomic History   Marital status: Single    Spouse name: Not on file   Number of children: Not on file   Years of education: Not on file   Highest education level: Not on file  Occupational History   Not on file  Tobacco Use   Smoking status: Never   Smokeless tobacco: Never  Substance and Sexual Activity   Alcohol use: Yes    Comment: Socail    Drug use: No   Sexual activity: Not on file  Other Topics Concern   Not on file  Social History Narrative   Not on file    Social Determinants of Health   Financial Resource Strain: Not on file  Food Insecurity: Not on file  Transportation Needs: Not on file  Physical Activity: Not on file  Stress: Not on file  Social Connections: Not on file    Allergies: No Known Allergies  Metabolic Disorder Labs: No results found for: "HGBA1C", "MPG" No results found for: "PROLACTIN" No results found for: "CHOL", "TRIG", "HDL", "CHOLHDL", "VLDL", "LDLCALC" No results found for: "TSH"  Therapeutic Level Labs: No results found for: "LITHIUM" No results found for: "VALPROATE" No results found for: "CBMZ"  Current Medications: Current Outpatient Medications  Medication Sig Dispense Refill   methylphenidate (RITALIN) 20 MG tablet Take 1 tablet (20 mg total) by mouth 2 (two) times daily. 60 tablet 0   [START ON 10/28/2021] methylphenidate (RITALIN) 20 MG tablet Take 1 tablet (20 mg total) by mouth 2 (two) times daily. 60 tablet 0   Ascorbic Acid (VITAMIN C) 1000 MG tablet 1 tablet     [START ON 10/11/2021] buPROPion (WELLBUTRIN XL) 150 MG 24 hr tablet Take 1 tablet (150 mg total) by mouth daily. Take total of 450 mg daily. Take along with 300 mg tab 90 tablet 1   buPROPion (WELLBUTRIN XL) 300 MG 24 hr tablet Take 1 tablet (300 mg total) by mouth daily. Take total of 450 mg daily, along with 150 mg tab 90 tablet 1   [START ON 10/20/2021] clonazePAM (KLONOPIN) 2 MG tablet Take 1 tablet (2 mg total) by mouth at bedtime. 30 tablet 2   cyclobenzaprine (FLEXERIL) 10 MG tablet Take 1 tablet (10 mg total) by mouth 3 (three) times daily as needed for up to 30 doses for muscle spasms. 30 tablet 0   docusate sodium (COLACE) 100 MG capsule Take 1 capsule (100 mg total) by mouth 2 (two) times daily. 10 capsule 0   ibuprofen (ADVIL,MOTRIN) 200 MG tablet Take 200 mg by mouth 3 (three) times daily as needed.     levothyroxine (SYNTHROID, LEVOTHROID) 75 MCG tablet Take 75 mcg by mouth daily before breakfast. 1 whole tablet (weekdays  Mon-Fri) 1.5 tabs on weekends (Sat-Sun)     methylphenidate (RITALIN) 20 MG tablet Take 1 tablet (20 mg total) by mouth 2 (two) times daily. 60 tablet 0   Multiple Vitamin (MULTIVITAMIN ADULT) TABS 1 tablet     [START ON 10/10/2021] sertraline (ZOLOFT) 100 MG tablet Take 2 tablets (200 mg total) by mouth at bedtime. 180 tablet 1   traMADol (ULTRAM) 50 MG tablet 1 tablet as needed     No current facility-administered medications for this visit.     Musculoskeletal: Strength & Muscle Tone:  N/A Gait & Station:  N/A Patient leans: N/A  Psychiatric Specialty Exam: Review of Systems  Psychiatric/Behavioral:  Negative for agitation, behavioral problems, confusion,  decreased concentration, dysphoric mood, hallucinations, self-injury, sleep disturbance and suicidal ideas. The patient is not nervous/anxious and is not hyperactive.   All other systems reviewed and are negative.   There were no vitals taken for this visit.There is no height or weight on file to calculate BMI.  General Appearance: Fairly Groomed  Eye Contact:  Good  Speech:  Clear and Coherent  Volume:  Normal  Mood:   good  Affect:  Appropriate, Congruent, and Full Range  Thought Process:  Coherent  Orientation:  Full (Time, Place, and Person)  Thought Content: Logical   Suicidal Thoughts:  No  Homicidal Thoughts:  No  Memory:  Immediate;   Good  Judgement:  Good  Insight:  Good  Psychomotor Activity:  Normal  Concentration:  Concentration: Good and Attention Span: Good  Recall:  Good  Fund of Knowledge: Good  Language: Good  Akathisia:  No  Handed:  Right  AIMS (if indicated): not done  Assets:  Communication Skills Desire for Improvement  ADL's:  Intact  Cognition: WNL  Sleep:  Good   Screenings: PHQ2-9    Flowsheet Row Video Visit from 10/[facility]/2020 in [facility]  PHQ-2 Total Score 0 0      Flowsheet Row  Video Visit from 01/03/2021 in [facility] Hosp[facility]Admission (Discharged) [facility]  C-SSRS RISK CATEGORY Low Risk No Risk No Risk        Assessment and Plan:  Jon Crutchshley Librizzi is a 41 y.o. year old male with a history of depression,  ADHD,  hypertension,  hyperlipidemia, hypothyroidism, obesity, who presents for follow up appointment for below.   1. MDD (major depressive disorder), recurrent, in full remission (HCC) 2. Anxiety He denies significant mood symptoms since the last visit/switching from Adderall IR to Ritalin.  Will continue current dose of sertraline in the bupropion as maintenance treatment for depression.  Will continue clonazepam as needed for anxiety.  Noted that he is wanting to taper down clonazepam in the future/once his situation is settled.   3. Attention deficit hyperactivity disorder (ADHD), unspecified ADHD type He had significant benefit from Ritalin without any side effect.  Will continue current dose to target ADHD.       Plan Continue sertraline 200 mg daily Continue bupropion 450 mg daily (150 mg + 300 mg )  Continue clonazepam 2 mg at night as needed for anxiety Continue Ritalin 20 mg twice a day  Next appointment: 9/1 at 8 AM, video     Past trials of medication: sertraline, Effexor (had "zaps"), Wellbutrin, Adderall, Adderall XR, Vyvanse, Concerta, Strattera (limited benefit),    The patient demonstrates the following risk factors for suicide: Chronic risk factors for suicide include: psychiatric disorder of depression. Acute risk factors for suicide include: N/A. Protective factors for this patient include: positive social support, coping skills and hope for the future. Considering these factors, the overall suicide risk at this point appears to be low. Patient is appropriate for outpatient follow  up., who presents for follow up appointment for below.      Collaboration of Care: Collaboration of Care: Other N/A  Patient/Guardian was advised Release of Information must be obtained prior to any record release in order to collaborate their care with an outside provider. Patient/Guardian was advised if they have not already done so to contact  the registration department to sign all necessary forms in order for [place] to release information regarding their care.   Consent: Patient/Guardian gives verbal consent for treatment and assignment of benefits for services provided during this visit. Patient/Guardian expressed understanding and agreed to proceed.    [name], MD 09/28/2021, 8:32 AM

## 2021-09-28 ENCOUNTER — Encounter: Payer: Self-pay | Admitting: Psychiatry

## 2021-09-28 ENCOUNTER — Telehealth (INDEPENDENT_AMBULATORY_CARE_PROVIDER_SITE_OTHER): Payer: No Typology Code available for payment source | Admitting: Psychiatry

## 2021-09-28 DIAGNOSIS — F909 Attention-deficit hyperactivity disorder, unspecified type: Secondary | ICD-10-CM

## 2021-09-28 DIAGNOSIS — F3342 Major depressive disorder, recurrent, in full remission: Secondary | ICD-10-CM | POA: Diagnosis not present

## 2021-09-28 DIAGNOSIS — F419 Anxiety disorder, unspecified: Secondary | ICD-10-CM | POA: Diagnosis not present

## 2021-09-28 MED ORDER — METHYLPHENIDATE HCL 20 MG PO TABS: 20.0000 mg | ORAL_TABLET | Freq: Two times a day (BID) | ORAL | 0 refills | Status: DC

## 2021-09-28 MED ORDER — SERTRALINE HCL 100 MG PO TABS: 200.0000 mg | ORAL_TABLET | Freq: Every day | ORAL | 1 refills | Status: DC

## 2021-09-28 MED ORDER — CLONAZEPAM 2 MG PO TABS: 2.0000 mg | ORAL_TABLET | Freq: Every day | ORAL | 2 refills | Status: DC

## 2021-09-28 MED ORDER — BUPROPION HCL ER (XL) 150 MG PO TB24: 150.0000 mg | ORAL_TABLET | Freq: Every day | ORAL | 1 refills | Status: DC

## 2021-09-28 NOTE — Patient Instructions (Signed)
Continue sertraline 200 mg daily Continue bupropion 450 mg daily (150 mg + 300 mg ) Continue clonazepam 2 mg at night as needed for anxiety Continue Ritalin 20 mg twice a day  Next appointment: 9/1 at 8 AM

## 2021-12-19 NOTE — Progress Notes (Deleted)
BH MD/PA/NP OP Progress Note  12/19/2021 5:37 PM Jon Beck  MRN:  [id number]  Chief Complaint: No chief complaint on file.  HPI: *** Visit Diagnosis: No diagnosis found.  Past Psychiatric History: Please see initial evaluation for full details. I have reviewed the history. No updates at this time.     Past Medical History:  Past Medical History:  Diagnosis Date   CES (cauda equina syndrome) (HCC)    Hypertension     Past Surgical History:  Procedure Laterality Date   LUMBAR LAMINECTOMY/DECOMPRESSION MICRODISCECTOMY Right 09/07/2020   Procedure: LUMBAR LAMINECTOMY/DECOMPRESSION MICRODISCECTOMY LUMBAR FOUR-FIVE;  Surgeon: Bedelia Person, MD;  [facility];  Service: Neurosurgery;  Laterality: Right;    Family Psychiatric History: Please see initial evaluation for full details. I have reviewed the history. No updates at this time.     Family History: No family history on file.  Social History:  Social History   Socioeconomic History   Marital status: Single    Spouse name: Not on file   Number of children: Not on file   Years of education: Not on file   Highest education level: Not on file  Occupational History   Not on file  Tobacco Use   Smoking status: Never   Smokeless tobacco: Never  Substance and Sexual Activity   Alcohol use: Yes    Comment: Socail    Drug use: No   Sexual activity: Not on file  Other Topics Concern   Not on file  Social History Narrative   Not on file   Social Determinants of Health   Financial Resource Strain: Not on file  Food Insecurity: Not on file  Transportation Needs: Not on file  Physical Activity: Not on file  Stress: Not on file  Social Connections: Not on file    Allergies: No Known Allergies  Metabolic Disorder Labs: No results found for: "HGBA1C", "MPG" No results found for: "PROLACTIN" No results found for: "CHOL", "TRIG", "HDL", "CHOLHDL", "VLDL", "LDLCALC" No results found for: "TSH"  Therapeutic  Level Labs: No results found for: "LITHIUM" No results found for: "VALPROATE" No results found for: "CBMZ"  Current Medications: Current Outpatient Medications  Medication Sig Dispense Refill   Ascorbic Acid (VITAMIN C) 1000 MG tablet 1 tablet     buPROPion (WELLBUTRIN XL) 150 MG 24 hr tablet Take 1 tablet (150 mg total) by mouth daily. Take total of 450 mg daily. Take along with 300 mg tab 90 tablet 1   buPROPion (WELLBUTRIN XL) 300 MG 24 hr tablet Take 1 tablet (300 mg total) by mouth daily. Take total of 450 mg daily, along with 150 mg tab 90 tablet 1   clonazePAM (KLONOPIN) 2 MG tablet Take 1 tablet (2 mg total) by mouth at bedtime. 30 tablet 2   cyclobenzaprine (FLEXERIL) 10 MG tablet Take 1 tablet (10 mg total) by mouth 3 (three) times daily as needed for up to 30 doses for muscle spasms. 30 tablet 0   docusate sodium (COLACE) 100 MG capsule Take 1 capsule (100 mg total) by mouth 2 (two) times daily. 10 capsule 0   ibuprofen (ADVIL,MOTRIN) 200 MG tablet Take 200 mg by mouth 3 (three) times daily as needed.     levothyroxine (SYNTHROID, LEVOTHROID) 75 MCG tablet Take 75 mcg by mouth daily before breakfast. 1 whole tablet (weekdays Mon-Fri) 1.5 tabs on weekends (Sat-Sun)     methylphenidate (RITALIN) 20 MG tablet Take 1 tablet (20 mg total) by mouth 2 (two) times daily.  60 tablet 0   methylphenidate (RITALIN) 20 MG tablet Take 1 tablet (20 mg total) by mouth 2 (two) times daily. 60 tablet 0   methylphenidate (RITALIN) 20 MG tablet Take 1 tablet (20 mg total) by mouth 2 (two) times daily. 60 tablet 0   Multiple Vitamin (MULTIVITAMIN ADULT) TABS 1 tablet     sertraline (ZOLOFT) 100 MG tablet Take 2 tablets (200 mg total) by mouth at bedtime. 180 tablet 1   traMADol (ULTRAM) 50 MG tablet 1 tablet as needed     No current facility-administered medications for this visit.     Musculoskeletal: Strength & Muscle Tone:  N/A Gait & Station:  N/A Patient leans: N/A  Psychiatric Specialty  Exam: Review of Systems  There were no vitals taken for this visit.There is no height or weight on file to calculate BMI.  General Appearance: {Appearance:22683}  Eye Contact:  {BHH EYE CONTACT:22684}  Speech:  Clear and Coherent  Volume:  Normal  Mood:  {BHH MOOD:22306}  Affect:  {Affect (PAA):22687}  Thought Process:  Coherent  Orientation:  Full (Time, Place, and Person)  Thought Content: Logical   Suicidal Thoughts:  {ST/HT (PAA):22692}  Homicidal Thoughts:  {ST/HT (PAA):22692}  Memory:  Immediate;   Good  Judgement:  {Judgement (PAA):22694}  Insight:  {Insight (PAA):22695}  Psychomotor Activity:  Normal  Concentration:  Concentration: Good and Attention Span: Good  Recall:  Good  Fund of Knowledge: Good  Language: Good  Akathisia:  No  Handed:  Right  AIMS (if indicated): not done  Assets:  Communication Skills Desire for Improvement  ADL's:  Intact  Cognition: WNL  Sleep:  {BHH GOOD/FAIR/POOR:22877}   Screenings: PHQ2-9    Flowsheet Row Video Visit from 10/20/2020 in [facility] Video Visit from 07/25/2020 in [facility]  PHQ-2 Total Score 0 0      Flowsheet Row Video Visit from 01/03/2021 in [facility] Video Visit from 10/20/2020 in [facility] to Hosp-Admission (Discharged) from 09/06/2020 in [facility]  C-SSRS RISK CATEGORY Low Risk No Risk No Risk        Assessment and Plan:  Jon Beck is a 41 y.o. year old male with a history of depression,  ADHD,  hypertension,  hyperlipidemia, hypothyroidism, obesity, who presents for follow up appointment for below.    1. MDD (major depressive disorder), recurrent, in full remission (HCC) 2. Anxiety He denies significant mood symptoms since the last visit/switching from Adderall IR to Ritalin.  Will continue current dose of sertraline in the bupropion as maintenance  treatment for depression.  Will continue clonazepam as needed for anxiety.  Noted that he is wanting to taper down clonazepam in the future/once his situation is settled.    3. Attention deficit hyperactivity disorder (ADHD), unspecified ADHD type He had significant benefit from Ritalin without any side effect.  Will continue current dose to target ADHD.        Plan Continue sertraline 200 mg daily Continue bupropion 450 mg daily (150 mg + 300 mg )  Continue clonazepam 2 mg at night as needed for anxiety Continue Ritalin 20 mg twice a day  Next appointment: 9/1 at 8 AM, video     Past trials of medication: sertraline, Effexor (had "zaps"), Wellbutrin, Adderall, Adderall XR, Vyvanse, Concerta, Strattera (limited benefit),    The patient demonstrates the following risk factors for suicide: Chronic risk factors for suicide include: psychiatric disorder of depression.  Acute risk factors for suicide include: N/A. Protective factors for this patient include: positive social support, coping skills and hope for the future. Considering these factors, the overall suicide risk at this point appears to be low. Patient is appropriate for outpatient follow up., who presents for follow up appointment for below.         Collaboration of Care: Collaboration of Care: {BH OP Collaboration of Care:21014065}  Patient/Guardian was advised Release of Information must be obtained prior to any record release in order to collaborate their care with an outside provider. Patient/Guardian was advised if they have not already done so to contact the registration department to sign all necessary forms in order for [place] to release information regarding their care.   Consent: Patient/Guardian gives verbal consent for treatment and assignment of benefits for services provided during this visit. Patient/Guardian expressed understanding and agreed to proceed.    [name], MD 12/19/2021, 5:37 PM

## 2021-12-21 ENCOUNTER — Telehealth: Payer: No Typology Code available for payment source | Admitting: Psychiatry

## 2022-01-17 ENCOUNTER — Telehealth: Payer: Self-pay | Admitting: Psychiatry

## 2022-01-17 ENCOUNTER — Other Ambulatory Visit: Payer: Self-pay | Admitting: Psychiatry

## 2022-01-17 DIAGNOSIS — F3342 Major depressive disorder, recurrent, in full remission: Secondary | ICD-10-CM

## 2022-01-17 MED ORDER — METHYLPHENIDATE HCL 20 MG PO TABS: 20.0000 mg | ORAL_TABLET | Freq: Two times a day (BID) | ORAL | 0 refills | Status: DC

## 2022-01-17 MED ORDER — CLONAZEPAM 2 MG PO TABS: 2.0000 mg | ORAL_TABLET | Freq: Every day | ORAL | 2 refills | Status: DC

## 2022-01-17 NOTE — Telephone Encounter (Signed)
Pt was notified.  

## 2022-01-17 NOTE — Telephone Encounter (Signed)
pt notified that the ritalin and the klonopin were both sent to the pharmacy .

## 2022-01-17 NOTE — Telephone Encounter (Signed)
Patient left message on 01-16-22 regarding refill on Klonopin and Ritalin. Please advise if can be called in. He has scheduled his appointment for 01-29-22

## 2022-01-17 NOTE — Telephone Encounter (Signed)
Ordered

## 2022-01-28 NOTE — Progress Notes (Unsigned)
Virtual Visit via Video Note  I connected with Jon Beck on 01/29/22 at  1:00 PM EDT by a video enabled telemedicine application and verified that I am speaking with the correct person using two identifiers.  Location: Patient: home Provider: office Persons participated in the visit- patient, provider    I discussed the limitations of evaluation and management by telemedicine and the availability of in person appointments. The patient expressed understanding and agreed to proceed.    I discussed the assessment and treatment plan with the patient. The patient was provided an opportunity to ask questions and all were answered. The patient agreed with the plan and demonstrated an understanding of the instructions.   The patient was advised to call back or seek an in-person evaluation if the symptoms worsen or if the condition fails to improve as anticipated.  I provided 15 minutes of non-face-to-face time during this encounter.   Norman Clay, MD    [facility] MD/PA/NP OP Progress Note  01/29/2022 1:33 PM Jon Beck  MRN:  [id number]  Chief Complaint:  Chief Complaint  Patient presents with   Follow-up   HPI:  This is a follow-up appointment for depression and ADHD.  He states that he has been doing very well.  His niece is not at his house anymore.  She tried to be with her boyfriend, which did not work.  She then moved into her mother's place.  He continues to communicate with her.  She appears to be happy, and does not do cutting anymore.  He is hoping to find out more after she visits his place.  He thinks he feels less stressed since she moved out as his niece and his partner used to have argument.  He has not heard back from his brother despite his attempt.  He denies feeling depressed or anxiety.  He sleeps well.  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  He prefers Ritalin as he does not have palpitation anymore  compared to Adderall.  He is able to focus good later in the day.  He does not take this medication every day.  He denies SI.  He denies panic attacks.  Although he is willing to taper down clonazepam in the future, he would like to hold it at this time as he is working on his weight loss.  He states that he could tell if he were not to take clonazepam, although he will be fine without taking it for a day.  He drank 1 drink only once a few weeks ago.  He denies drug use.  He denies cigarette use.    Visit Diagnosis:    ICD-10-CM   1. Attention deficit hyperactivity disorder (ADHD), unspecified ADHD type  F90.9 Drug Screen, Urine    2. MDD (major depressive disorder), recurrent, in full remission ([place])  F33.42 buPROPion (WELLBUTRIN XL) 150 MG 24 hr tablet    3. Anxiety  F41.9 sertraline (ZOLOFT) 100 MG tablet      Past Psychiatric History: Please see initial evaluation for full details. I have reviewed the history. No updates at this time.     Past Medical History:  Past Medical History:  Diagnosis Date   CES (cauda equina syndrome) ([place])    Hypertension     Past Surgical History:  Procedure Laterality Date   LUMBAR LAMINECTOMY/DECOMPRESSION MICRODISCECTOMY Right 09/07/2020   Procedure: LUMBAR LAMINECTOMY/DECOMPRESSION MICRODISCECTOMY LUMBAR FOUR-FIVE;  Surgeon: Duffy Rhody  G, MD;  Location: [place];  Service: Neurosurgery;  Laterality: Right;    Family Psychiatric History: Please see initial evaluation for full details. I have reviewed the history. No updates at this time.     Family History: No family history on file.  Social History:  Social History   Socioeconomic History   Marital status: Single    Spouse name: Not on file   Number of children: Not on file   Years of education: Not on file   Highest education level: Not on file  Occupational History   Not on file  Tobacco Use   Smoking status: Never   Smokeless tobacco: Never  Substance and Sexual Activity    Alcohol use: Yes    Comment: Socail    Drug use: No   Sexual activity: Not on file  Other Topics Concern   Not on file  Social History Narrative   Not on file   Social Determinants of Health   Financial Resource Strain: Not on file  Food Insecurity: Not on file  Transportation Needs: Not on file  Physical Activity: Not on file  Stress: Not on file  Social Connections: Not on file    Allergies: No Known Allergies  Metabolic Disorder Labs: No results found for: "HGBA1C", "MPG" No results found for: "PROLACTIN" No results found for: "CHOL", "TRIG", "HDL", "CHOLHDL", "VLDL", "LDLCALC" No results found for: "TSH"  Therapeutic Level Labs: No results found for: "LITHIUM" No results found for: "VALPROATE" No results found for: "CBMZ"  Current Medications: Current Outpatient Medications  Medication Sig Dispense Refill   [START ON 02/16/2022] methylphenidate (RITALIN) 20 MG tablet Take 1 tablet (20 mg total) by mouth 2 (two) times daily. 60 tablet 0   [START ON 03/19/2022] methylphenidate (RITALIN) 20 MG tablet Take 1 tablet (20 mg total) by mouth 2 (two) times daily. 60 tablet 0   [START ON 04/18/2022] methylphenidate (RITALIN) 20 MG tablet Take 1 tablet (20 mg total) by mouth 2 (two) times daily. 60 tablet 0   Ascorbic Acid (VITAMIN C) 1000 MG tablet 1 tablet     [START ON 04/10/2022] buPROPion (WELLBUTRIN XL) 150 MG 24 hr tablet Take 1 tablet (150 mg total) by mouth daily. Take total of 450 mg daily. Take along with 300 mg tab 90 tablet 1   [START ON 02/17/2022] buPROPion (WELLBUTRIN XL) 300 MG 24 hr tablet Take 1 tablet (300 mg total) by mouth daily. Take total of 450 mg daily, along with 150 mg tab 90 tablet 1   clonazePAM (KLONOPIN) 2 MG tablet Take 1 tablet (2 mg total) by mouth at bedtime. 30 tablet 2   cyclobenzaprine (FLEXERIL) 10 MG tablet Take 1 tablet (10 mg total) by mouth 3 (three) times daily as needed for up to 30 doses for muscle spasms. 30 tablet 0   docusate  sodium (COLACE) 100 MG capsule Take 1 capsule (100 mg total) by mouth 2 (two) times daily. 10 capsule 0   ibuprofen (ADVIL,MOTRIN) 200 MG tablet Take 200 mg by mouth 3 (three) times daily as needed.     levothyroxine (SYNTHROID, LEVOTHROID) 75 MCG tablet Take 75 mcg by mouth daily before breakfast. 1 whole tablet (weekdays Mon-Fri) 1.5 tabs on weekends (Sat-Sun)     methylphenidate (RITALIN) 20 MG tablet Take 1 tablet (20 mg total) by mouth 2 (two) times daily. 60 tablet 0   Multiple Vitamin (MULTIVITAMIN ADULT) TABS 1 tablet     [START ON 04/09/2022] sertraline (ZOLOFT) 100 MG tablet Take  2 tablets (200 mg total) by mouth at bedtime. 180 tablet 1   traMADol (ULTRAM) 50 MG tablet 1 tablet as needed     No current facility-administered medications for this visit.     Musculoskeletal: Strength & Muscle Tone:  N/A Gait & Station:  N/A Patient leans: N/A  Psychiatric Specialty Exam: Review of Systems  Psychiatric/Behavioral:  Negative for agitation, behavioral problems, confusion, decreased concentration, dysphoric mood, hallucinations, self-injury, sleep disturbance and suicidal ideas. The patient is not nervous/anxious and is not hyperactive.   All other systems reviewed and are negative.   There were no vitals taken for this visit.There is no height or weight on file to calculate BMI.  General Appearance: Fairly Groomed  Eye Contact:  Good  Speech:  Clear and Coherent  Volume:  Normal  Mood:   good  Affect:  Appropriate, Congruent, and calm  Thought Process:  Coherent  Orientation:  Full (Time, Place, and Person)  Thought Content: Logical   Suicidal Thoughts:  No  Homicidal Thoughts:  No  Memory:  Immediate;   Good  Judgement:  Good  Insight:  Good  Psychomotor Activity:  Normal  Concentration:  Concentration: Good and Attention Span: Good  Recall:  Good  Fund of Knowledge: Good  Language: Good  Akathisia:  No  Handed:  Right  AIMS (if indicated): not done  Assets:   Communication Skills Desire for Improvement  ADL's:  Intact  Cognition: WNL  Sleep:  Good   Screenings: PHQ2-9    Flowsheet Row Video Visit from 10/20/2020 in [place] Video Visit from 07/25/2020 in [place]  PHQ-2 Total Score 0 0      Flowsheet Row Video Visit from 01/03/2021 in [place] Video Visit from 10/20/2020 in [place] ED to Hosp-Admission (Discharged) from 09/06/2020 in [place] No Risk No Risk        Assessment and Plan:  Jon Beck is a 41 y.o. year old male with a history of depression,  ADHD,  hypertension,  hyperlipidemia, hypothyroidism, obesity , who presents for follow up appointment for below.   1. MDD (major depressive disorder), recurrent, in full remission ([place]) 2. Anxiety He denies significant mood symptoms since the last visit.  Psychosocial stressors includes conflict with his brother, who has not contacted with him for almost an year, and his niece moving out to her mother's.  Will continue current dose of sertraline and bupropion as maintenance treatment for depression.  He verbalized understanding to stay on this regimen and work on tapering down clonazepam.  Although he is willing to try tapering down clonazepam, he would like to wait at this time while working on weight loss.   3. Attention deficit hyperactivity disorder (ADHD), unspecified ADHD type He reportedly had 3 psychological testing, first at age 44.  He reports great benefit from within and without side effect.  Will do current dose to target ADHD.  Will obtain UDS for screening.  He verbalized understanding.     Plan Continue sertraline 200 mg daily Continue bupropion 450 mg daily (150 mg + 300 mg )  Continue clonazepam 2 mg at night as needed for anxiety Continue Ritalin 20 mg twice a day  Obtain  UDS Next appointment: 1/4 at 10 AM, in person     Past trials of medication: sertraline, Effexor (had "zaps"), Wellbutrin, Adderall, Adderall XR, Vyvanse, Concerta, Strattera (limited benefit),  The patient demonstrates the following risk factors for suicide: Chronic risk factors for suicide include: psychiatric disorder of depression. Acute risk factors for suicide include: N/A. Protective factors for this patient include: positive social support, coping skills and hope for the future. Considering these factors, the overall suicide risk at this point appears to be low. Patient is appropriate for outpatient follow up., who presents for follow up appointment for below.         Collaboration of Care: Collaboration of Care: Other reviewed notes in Epic  Patient/Guardian was advised Release of Information must be obtained prior to any record release in order to collaborate their care with an outside provider. Patient/Guardian was advised if they have not already done so to contact the registration department to sign all necessary forms in order for [place] to release information regarding their care.   Consent: Patient/Guardian gives verbal consent for treatment and assignment of benefits for services provided during this visit. Patient/Guardian expressed understanding and agreed to proceed.    Norman Clay, MD 01/29/2022, 1:33 PM

## 2022-01-29 ENCOUNTER — Encounter: Payer: Self-pay | Admitting: Psychiatry

## 2022-01-29 ENCOUNTER — Telehealth (INDEPENDENT_AMBULATORY_CARE_PROVIDER_SITE_OTHER): Payer: No Typology Code available for payment source | Admitting: Psychiatry

## 2022-01-29 DIAGNOSIS — F909 Attention-deficit hyperactivity disorder, unspecified type: Secondary | ICD-10-CM | POA: Diagnosis not present

## 2022-01-29 DIAGNOSIS — F419 Anxiety disorder, unspecified: Secondary | ICD-10-CM

## 2022-01-29 DIAGNOSIS — F3342 Major depressive disorder, recurrent, in full remission: Secondary | ICD-10-CM

## 2022-01-29 DIAGNOSIS — F418 Other specified anxiety disorders: Secondary | ICD-10-CM

## 2022-01-29 MED ORDER — METHYLPHENIDATE HCL 20 MG PO TABS: 20.0000 mg | ORAL_TABLET | Freq: Two times a day (BID) | ORAL | 0 refills | Status: DC

## 2022-01-29 MED ORDER — BUPROPION HCL ER (XL) 300 MG PO TB24: 300.0000 mg | ORAL_TABLET | Freq: Every day | ORAL | 1 refills | Status: DC

## 2022-01-29 MED ORDER — BUPROPION HCL ER (XL) 150 MG PO TB24: 150.0000 mg | ORAL_TABLET | Freq: Every day | ORAL | 1 refills | Status: DC

## 2022-01-29 MED ORDER — SERTRALINE HCL 100 MG PO TABS: 200.0000 mg | ORAL_TABLET | Freq: Every day | ORAL | 1 refills | Status: DC

## 2022-01-29 NOTE — Patient Instructions (Addendum)
Continue sertraline 200 mg daily Continue bupropion 450 mg daily (150 mg + 300 mg )  Continue clonazepam 2 mg at night as needed for anxiety Continue Ritalin 20 mg twice a day  Obtain UDS Next appointment: 1/4 at 10 AM, in person

## 2022-03-13 IMAGING — MR MR LUMBAR SPINE W/O CM
4 of 5 series · 31 of 48 positions shown · non-contrast
Comparison: None.

CLINICAL DATA: Low back pain

EXAM:
MRI LUMBAR SPINE WITHOUT CONTRAST
TECHNIQUE: Multiplanar, multisequence MR imaging of the lumbar spine was
performed. No intravenous contrast was administered.

[Series 5: T1 · sagittal · 4.0mm · 0.88mm/px · 6 of 14 slices shown (1 of 2)]
[im 1/14]
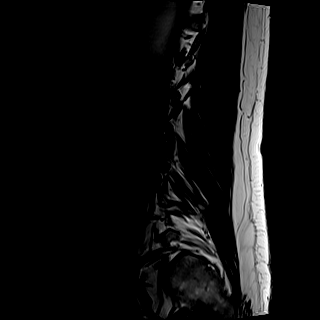
[im 3/14]
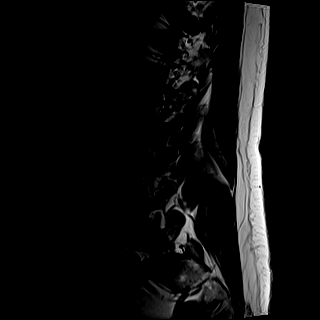
[im 6/14]
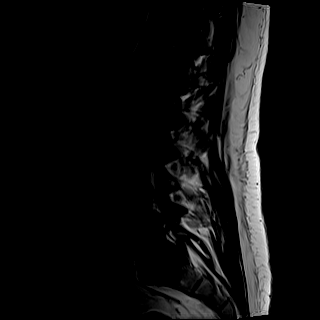
[im 8/14]
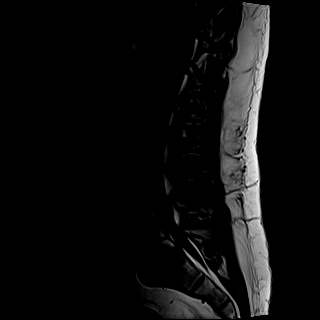
[im 11/14]
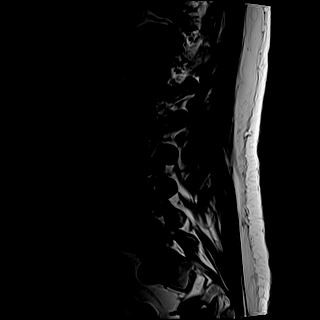
[im 14/14]
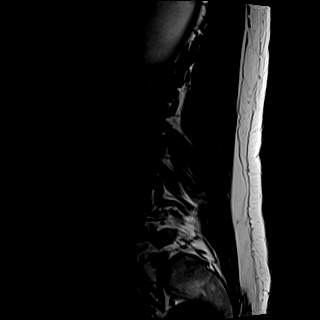

[Series 6: T2 · sagittal · 4.0mm · 0.81mm/px · 5 of 14 slices shown (1 of 2)]
[im 1/14]
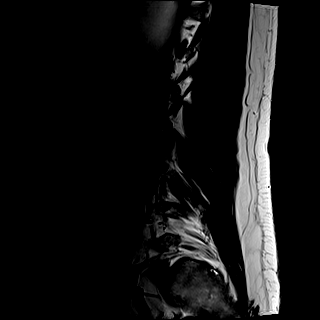
[im 4/14]
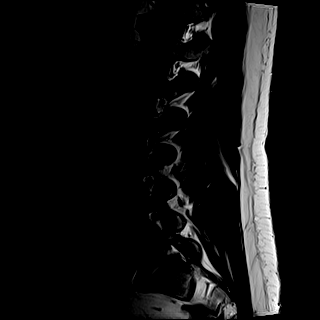
[im 7/14]
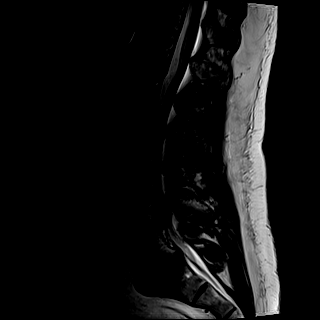
[im 10/14]
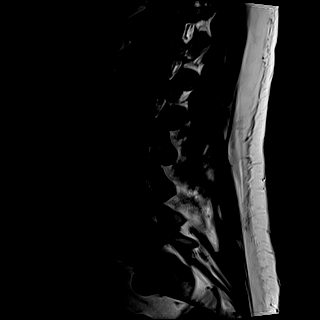
[im 14/14]
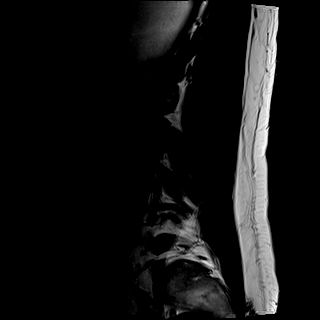

[Series 8: T2 · axial · 4.0mm · 0.62mm/px · z∈[-124,+116]mm · 10 of 42 slices shown (2 of 2)]
[im 3/42]
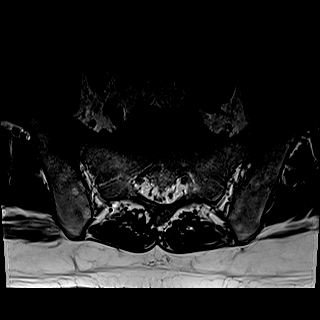
[im 6/42]
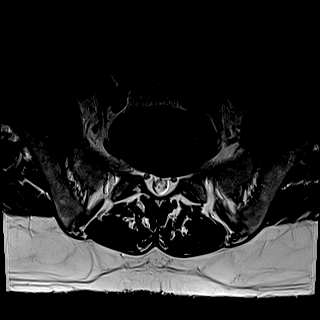
[im 9/42]
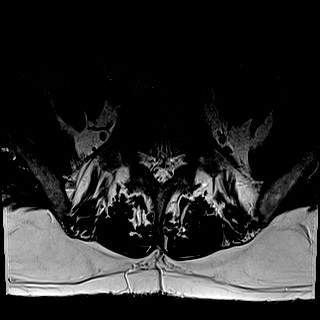
[im 14/42]
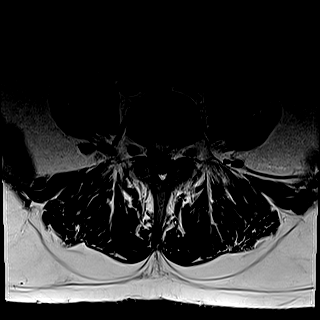
[im 20/42]
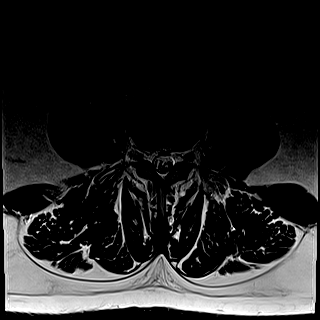
[im 22/42]
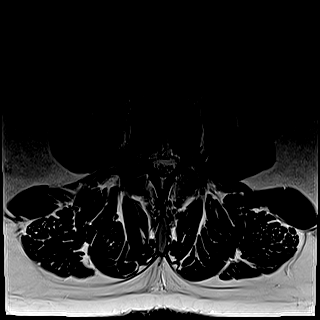
[im 25/42]
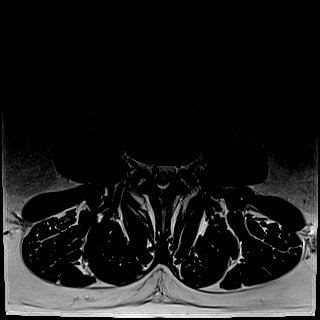
[im 31/42]
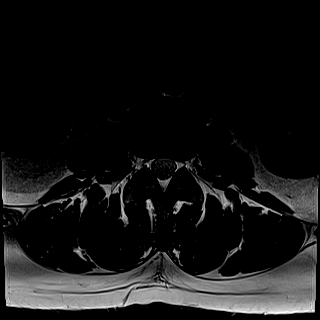
[im 36/42]
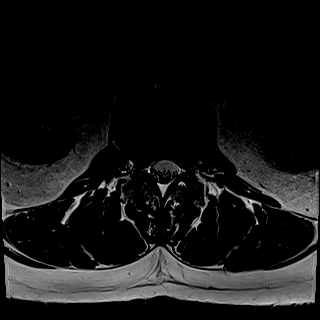
[im 42/42]
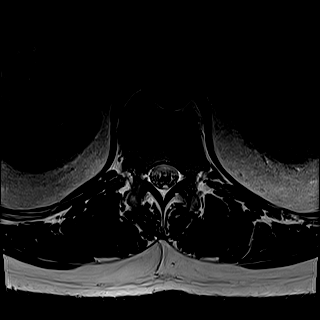

[Series 9: T1 · axial · 4.0mm · 0.39mm/px · z∈[-124,+116]mm · 10 of 42 slices shown (2 of 2)]
[im 3/42]
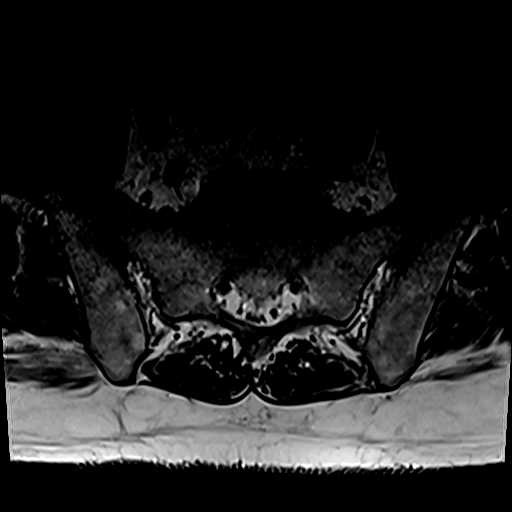
[im 6/42]
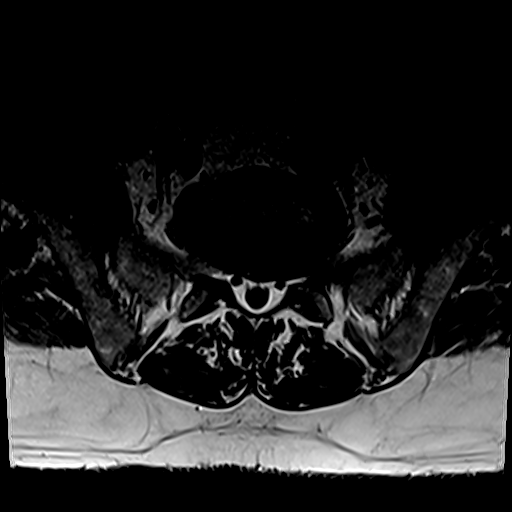
[im 9/42]
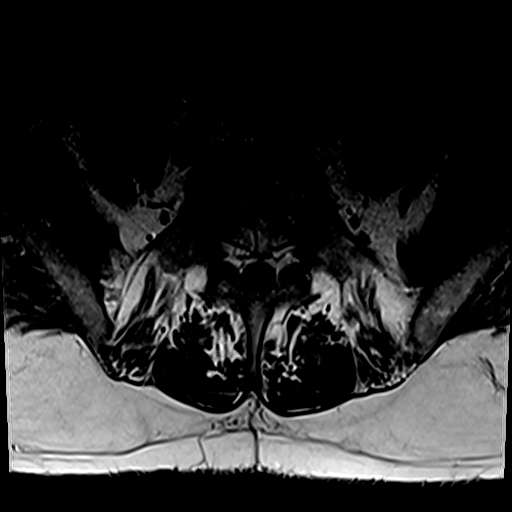
[im 14/42]
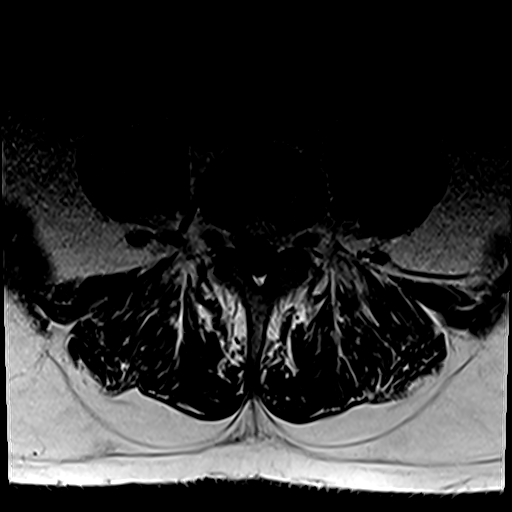
[im 20/42]
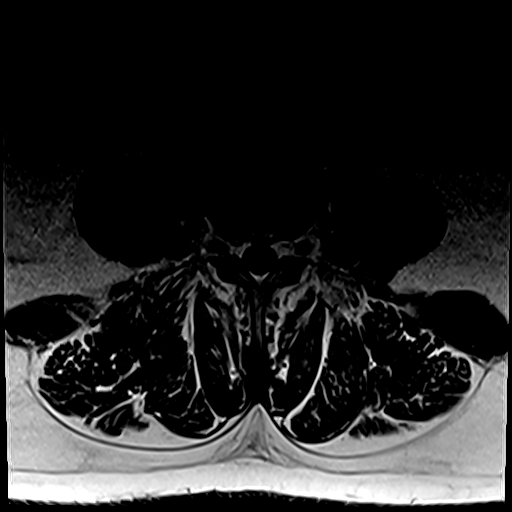
[im 22/42]
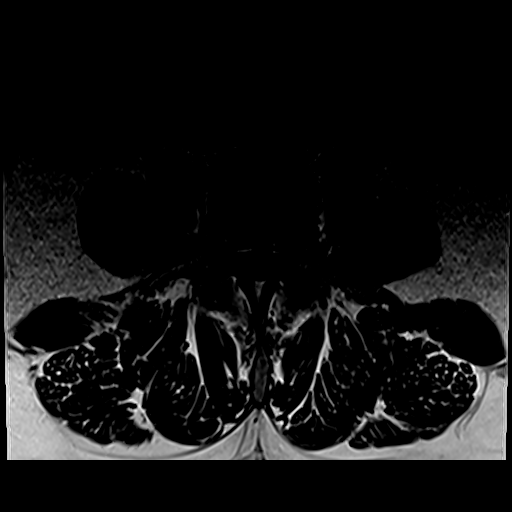
[im 25/42]
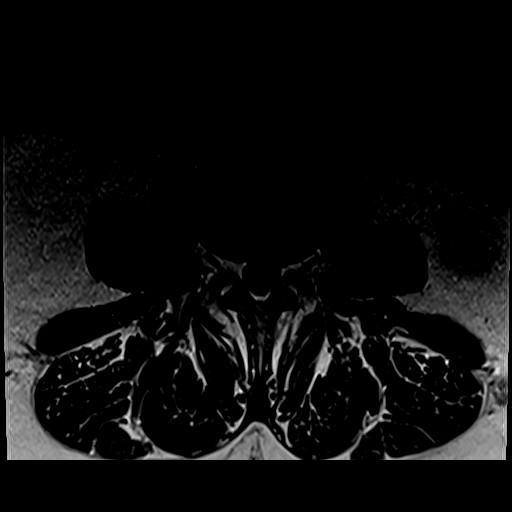
[im 31/42]
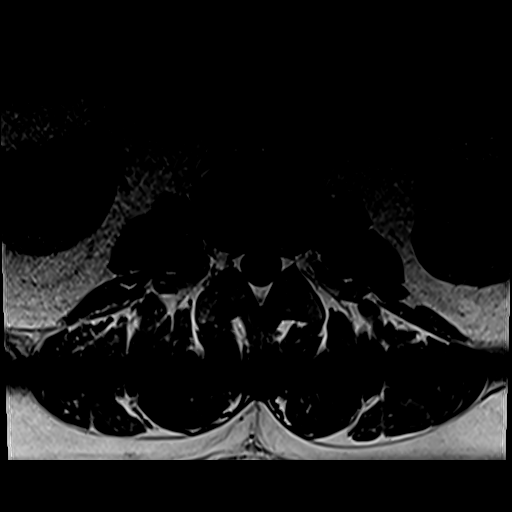
[im 36/42]
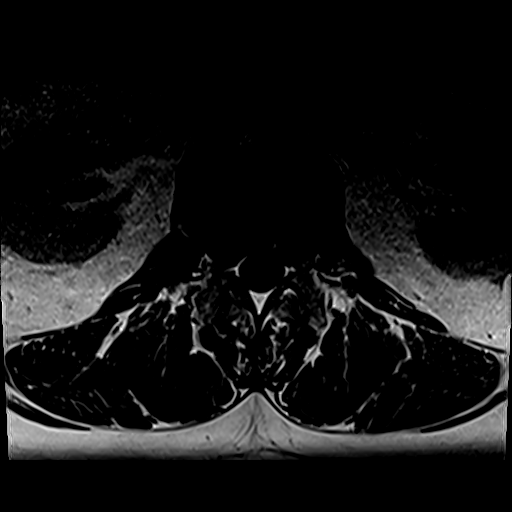
[im 42/42]
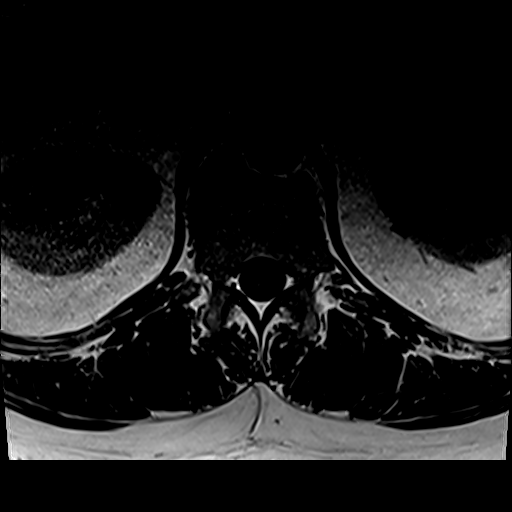

[31 of 48 positions shown; findings below may reference images not displayed]

FINDINGS: Segmentation:  Standard.

Alignment:  Physiologic.

Vertebrae:  No fracture, evidence of discitis, or bone lesion.

Conus medullaris and cauda equina: Conus extends to the T12-L1
level.

Paraspinal and other soft tissues: Negative

Disc levels:

L1-L2: Normal disc space and facet joints. No spinal canal stenosis.
No neural foraminal stenosis.

L2-L3: Normal disc space and facet joints. No spinal canal stenosis.
No neural foraminal stenosis.

L3-L4: Small left subarticular disc protrusion. Left lateral recess
narrowing without central spinal canal stenosis. No neural foraminal
stenosis.

L4-L5: Large central disc extrusion with superior migration to the
pedicle level. Severe spinal canal stenosis. No neural foraminal
stenosis.

L5-S1: Disc space narrowing. No spinal canal stenosis. No neural
foraminal stenosis.

Visualized sacrum: Normal.
IMPRESSION: 1. Large central disc extrusion with superior migration at L4-L5
causing severe spinal canal stenosis with impingement of the cauda
equina.
2. Small left subarticular disc protrusion at L3-L4 narrowing the
left lateral recess. Correlate for left L4 radiculopathy.

## 2022-04-19 ENCOUNTER — Other Ambulatory Visit: Payer: Self-pay | Admitting: Psychiatry

## 2022-04-19 DIAGNOSIS — F3342 Major depressive disorder, recurrent, in full remission: Secondary | ICD-10-CM

## 2022-04-20 ENCOUNTER — Ambulatory Visit (HOSPITAL_COMMUNITY)
Admission: EM | Admit: 2022-04-20 | Discharge: 2022-04-20 | Disposition: A | Discharge status: Home / Self Care | Payer: No Typology Code available for payment source | Attending: Family Medicine | Admitting: Family Medicine

## 2022-04-20 ENCOUNTER — Telehealth: Payer: No Typology Code available for payment source | Admitting: Physician Assistant

## 2022-04-20 ENCOUNTER — Ambulatory Visit
Admission: EM | Admit: 2022-04-20 | Discharge: 2022-04-20 | Discharge status: Against Medical Advice | Payer: No Typology Code available for payment source

## 2022-04-20 DIAGNOSIS — Z760 Encounter for issue of repeat prescription: Secondary | ICD-10-CM

## 2022-04-20 DIAGNOSIS — F411 Generalized anxiety disorder: Secondary | ICD-10-CM

## 2022-04-20 MED ORDER — CLONAZEPAM 2 MG PO TBDP: 2.0000 mg | ORAL_TABLET | Freq: Every evening | ORAL | 0 refills | Status: DC | PRN

## 2022-04-20 NOTE — ED Provider Notes (Cosign Needed)
Behavioral Health Urgent Care Medical Screening Exam  Patient Name: Jon Beck MRN: [id number] Date of Evaluation: 04/20/22 Chief Complaint:  "Unable to refill Klonopin" Diagnosis: GAD & Medication Refill  Final diagnoses:  Encounter for medication refill  GAD (generalized anxiety disorder)    History of Present illness:  Jon Beck is a 41 y.o. male requesting a short supply of Klonopin until his psychiatry appointment on 04/25/22.  Jon Beck 41 y.o., male patient presented to [facility] as a walk in, voluntarily, unaccompanied, as a routine visit due to needing a bridge of Klonopin until his psychiatry appointment on 04/25/22 medication refill problem.  Jon Beck, 41 y.o., male patient seen face to face by this provider, consulted with Dr. Lucianne Muss; and chart reviewed on 04/20/22.    On evaluation Jon Beck reports calling his pharmacy overnight for refill of his Klonopin and being made aware that he had no additional refills.  He last saw his psychiatric provider Dr. [name], MD, at [facility] 01/29/2022 and received 90 days refill for Klonopin 01/17/2022, however the medication prescription ran out 5 days prior to his next appointment scheduled for 04/25/2022. He is fears that he may experience withdrawals if he has to go a full 5 days without his medication. On chart review, patient has consistently been prescribed and adherent with psychotropic treatment for an extended period of time.   During evaluation Jon Beck is sitting upright in the chair, without acute distress. He is alert, oriented x 4, calm, cooperative and attentive.  His mood is euthymic with congruent affect. He has normal speech, and behavior.  Objectively there is no evidence of psychosis/mania or delusional thinking.  Patient is able to converse coherently, goal directed thoughts, no distractibility, or pre-occupation.  Patient denies suicidal or homicidal ideation.   Patient has no acute psychiatric condition or crisis.  Patient is routine here for medication management only.  Patient does not meet criteria for psychiatric inpatient treatment.   Flowsheet Row Video Visit from 01/03/2021 in [facility] Video Visit from 10/20/2020 in [facility] ED to Hosp-Admission (Discharged) from 09/06/2020 in [facility]  C-SSRS RISK CATEGORY Low Risk No Risk No Risk       Psychiatric Specialty Exam  Presentation  General Appearance:Appropriate for Environment  Eye Contact:Good  Speech:Beck and Coherent  Speech Volume:Normal    Mood and Affect  Mood:Euthymic  Affect:Congruent   Thought Process  Thought Processes:Coherent  Descriptions of Associations:Intact  Orientation:Full (Time, Place and Person)  Suicidal Thoughts:No  Homicidal Thoughts:No   Sensorium  Memory:Immediate Good  Judgment:Good  Insight:None   Executive Functions  Concentration:Good  Attention Span:Good  Recall:Good  Fund of Knowledge:Good  Language:Good   Psychomotor Activity  Psychomotor Activity:Normal   Assets  Assets:Communication Skills; Desire for Improvement; Financial Resources/Insurance; Physical Health   Sleep  Sleep:-- (Requires Klonopin (prescribed and taken for several years) confirmed via chart review)  Number of hours: No data recorded  No data recorded  Physical Exam: Physical Exam HENT:     Head: Normocephalic.  Eyes:     Extraocular Movements: Extraocular movements intact.     Pupils: Pupils are equal, round, and reactive to light.  Cardiovascular:     Rate and Rhythm: Normal rate.  Pulmonary:     Effort: Pulmonary effort is normal.     Breath sounds: Normal breath sounds.  Neurological:     General: No focal deficit present.     Mental  Status: He is alert.     Review of Systems  Psychiatric/Behavioral: Negative.          Fears withdrawal symptoms if unable to refill chronic prescription for Klonopin 2 mg at bedtime.   Blood pressure (!) 145/91, pulse 86, temperature 98.7 F (37.1 C), temperature source Oral, resp. rate 20, SpO2 97 %. There is no height or weight on file to calculate BMI.     [facility] MSE Discharge Disposition for Follow up and Recommendations: Based on my evaluation the patient does not appear to have an emergency medical condition and can be discharged as already scheduled to follow-up with outpatient psychiatric provider. Dr. [name], MD [facility].  -Agreed to bridge Klonopin 5 mg @bedtime  x 5 tablets till outpatient to follow-up with psychiatric provider on 04/26/2022.  PMP aware, [facility] substance registry reviewed and is appropriate for medication refill.  [facility], FNP-C, PMHNP-BC  04/20/2022, 11:58 AM

## 2022-04-20 NOTE — Progress Notes (Signed)
Jon Beck ROUTINE:  is a 41 year old male who presents voluntarily a [facility]. Pt. denies SI/HI/ AHV. Pt. has dx of MDD and ADHD. He is seeking refill on medications. denies subtance use. Client was alert and oriented x4.   04/20/22 1110  [facility] Triage Screening (Walk-ins at [facility] only)  How Did You Hear About [place]? Self  What Is the Reason for Your Visit/Call Today? Pt. is a 7 year ol male who presents voluntarily  a [facility].  Pt. denies SI/HI/ AHV. Pt. has dx of MDD and ADHD.  He is seeking refill on medications. denies subtance use. Client was alert and oriented x4.[name]  How Long Has This Been Causing You Problems? <Week  Have You Recently Had Any Thoughts About Hurting Yourself? No  Are You Planning to Commit Suicide/Harm Yourself At This time? No  Have you Recently Had Thoughts About Hurting Someone [name]? No  Are You Planning To Harm Someone At This Time? No  Are you currently experiencing any auditory, visual or other hallucinations? No  Have You Used Any Alcohol or Drugs in the Past 24 Hours? No  Do you have any current medical co-morbidities that require immediate attention? No  Clinician description of patient physical appearance/behavior: well-groomed,good eye cotact, cooperative, engaged.  What Do You Feel Would Help You the Most Today? Medication(s)  If access to [facility] was not available, would you have sought care in the Emergency Department? No  Determination of Need Routine (7 days)  Options For Referral Medication Management;Outpatient Therapy

## 2022-04-20 NOTE — Progress Notes (Signed)
Patient advised we were unable to accommodate his request of refilling a controlled substance. He is at a local UC in person per Epic but did not cancel his appt. Patient did not show for the video visit. Will mark no charge.

## 2022-04-20 NOTE — Progress Notes (Signed)
  Jon Beck is a 41 year old male who presents voluntarily a [facility]. Pt. denies SI/HI/ AHV. Pt. has dx of MDD and ADHD. He is seeking refill on medications, denies subtance use.  Pt. Is usually medication compliant but just noticed that he is almost out and cannot wait until Tuesday. Pt. was alert and oriented x4.   04/20/22 1110  [facility] Triage Screening (Walk-ins at [facility] only)  How Did You Hear About [place]? Self  What Is the Reason for Your Visit/Call Today? Pt. is a 41 year old male who presents voluntarily  a [facility].  Pt. denies SI/HI/ AHV. Pt. has dx of MDD and ADHD.  He is seeking refill on medications. denies subtance use. Client was alert and oriented x4.[name]  How Long Has This Been Causing You Problems? <Week  Have You Recently Had Any Thoughts About Hurting Yourself? No  Are You Planning to Commit Suicide/Harm Yourself At This time? No  Have you Recently Had Thoughts About Hurting Someone Jon Beck? No  Are You Planning To Harm Someone At This Time? No  Are you currently experiencing any auditory, visual or other hallucinations? No  Have You Used Any Alcohol or Drugs in the Past 24 Hours? No  Do you have any current medical co-morbidities that require immediate attention? No  Clinician description of patient physical appearance/behavior: well-groomed,good eye cotact, cooperative, engaged.  What Do You Feel Would Help You the Most Today? Medication(s)  If access to [facility] was not available, would you have sought care in the Emergency Department? No  Determination of Need Routine (7 days)  Options For Referral Medication Management;Outpatient Therapy

## 2022-04-20 NOTE — Discharge Instructions (Addendum)
Follow-up with your mental health provider Dr. [name], MD on 04/25/22 for on-going medication management.

## 2022-04-21 ENCOUNTER — Telehealth: Payer: Self-pay | Admitting: Family Medicine

## 2022-04-21 ENCOUNTER — Telehealth (HOSPITAL_COMMUNITY): Payer: Self-pay | Admitting: Family Medicine

## 2022-04-21 MED ORDER — CLONAZEPAM 2 MG PO TABS: 2.0000 mg | ORAL_TABLET | Freq: Every day | ORAL | 0 refills | Status: DC

## 2022-04-21 MED ORDER — CLONAZEPAM 2 MG PO TABS: 2.0000 mg | ORAL_TABLET | Freq: Every evening | ORAL | 0 refills | Status: DC | PRN

## 2022-04-21 NOTE — Telephone Encounter (Signed)
Received a message that patient called unable to pick up prescription or 5 tablets of Klonopin which this writer prescribed yesterday on ODT form and patient requested tablet form. Updated prescription sent to pharmacy. Called to verify no other concerns related to filling prescription.

## 2022-04-21 NOTE — Addendum Note (Signed)
Addended by: Bing Neighbors on: 04/21/2022 02:25 PM   Modules accepted: Orders

## 2022-04-21 NOTE — Telephone Encounter (Signed)
Pertaining to patient's encounter on 04/20/2022 with this Clinical research associate at [facility].  Patient came in requesting a bridge on his Klonopin for 5 days to allow him to continue medication therapy until he follows up with his psychiatrist on 04/25/2022.  Received a message that there was a problem with the pharmacy filling prescription.  Contacted [facility] at [address] spoke with pharmacist who advised that patient does not take the ODT form of the Klonopin and request a new prescription for Klonopin 2 mg tablets.  Agreed to resend prescription and tablet form instead of ODT form.

## 2022-04-22 ENCOUNTER — Other Ambulatory Visit: Payer: Self-pay | Admitting: Psychiatry

## 2022-04-22 DIAGNOSIS — F3342 Major depressive disorder, recurrent, in full remission: Secondary | ICD-10-CM

## 2022-04-22 MED ORDER — CLONAZEPAM 2 MG PO TABS: 2.0000 mg | ORAL_TABLET | Freq: Every day | ORAL | 2 refills | Status: DC

## 2022-04-23 NOTE — Progress Notes (Deleted)
[facility] [place] [facility] MD/PA/NP OP Progress Note  04/23/2022 10:54 AM Rodrigo Mcgranahan  MRN:  [id number]  Chief Complaint: No chief complaint on file.  HPI: *** Visit Diagnosis: No diagnosis found.  Past Psychiatric History: Please see initial evaluation for full details. I have reviewed the history. No updates at this time.     Past Medical History:  Past Medical History:  Diagnosis Date   CES (cauda equina syndrome) ([place])    Hypertension     Past Surgical History:  Procedure Laterality Date   LUMBAR LAMINECTOMY/DECOMPRESSION MICRODISCECTOMY Right 09/07/2020   Procedure: LUMBAR LAMINECTOMY/DECOMPRESSION MICRODISCECTOMY LUMBAR FOUR-FIVE;  Surgeon: Vallarie Mare, MD;  Location: [facility];  Service: Neurosurgery;  Laterality: Right;    Family Psychiatric History: Please see initial evaluation for full details. I have reviewed the history. No updates at this time.     Family History: No family history on file.  Social History:  Social History   Socioeconomic History   Marital status: Single    Spouse name: Not on file   Number of children: Not on file   Years of education: Not on file   Highest education level: Not on file  Occupational History   Not on file  Tobacco Use   Smoking status: Never   Smokeless tobacco: Never  Substance and Sexual Activity   Alcohol use: Yes    Comment: Socail    Drug use: No   Sexual activity: Not on file  Other Topics Concern   Not on file  Social History Narrative   Not on file   Social Determinants of Health   Financial Resource Strain: Not on file  Food Insecurity: Not on file  Transportation Needs: Not on file  Physical Activity: Not on file  Stress: Not on file  Social Connections: Not on file    Allergies: No Known Allergies  Metabolic Disorder Labs: No results found for: "HGBA1C", "MPG" No results found for: "PROLACTIN" No results found for: "CHOL", "TRIG", "HDL", "CHOLHDL", "VLDL", "LDLCALC" No results found for: "TSH"  Therapeutic  Level Labs: No results found for: "LITHIUM" No results found for: "VALPROATE" No results found for: "CBMZ"  Current Medications: Current Outpatient Medications  Medication Sig Dispense Refill   Ascorbic Acid (VITAMIN C) 1000 MG tablet 1 tablet     buPROPion (WELLBUTRIN XL) 150 MG 24 hr tablet Take 1 tablet (150 mg total) by mouth daily. Take total of 450 mg daily. Take along with 300 mg tab 90 tablet 1   clonazePAM (KLONOPIN) 2 MG tablet Take 1 tablet (2 mg total) by mouth at bedtime as needed for anxiety. 5 tablet 0   clonazePAM (KLONOPIN) 2 MG tablet Take 1 tablet (2 mg total) by mouth at bedtime. 30 tablet 2   cyclobenzaprine (FLEXERIL) 10 MG tablet Take 1 tablet (10 mg total) by mouth 3 (three) times daily as needed for up to 30 doses for muscle spasms. 30 tablet 0   docusate sodium (COLACE) 100 MG capsule Take 1 capsule (100 mg total) by mouth 2 (two) times daily. 10 capsule 0   ibuprofen (ADVIL,MOTRIN) 200 MG tablet Take 200 mg by mouth 3 (three) times daily as needed.     levothyroxine (SYNTHROID, LEVOTHROID) 75 MCG tablet Take 75 mcg by mouth daily before breakfast. 1 whole tablet (weekdays Mon-Fri) 1.5 tabs on weekends (Sat-Sun)     methylphenidate (RITALIN) 20 MG tablet Take 1 tablet (20 mg total) by mouth 2 (two) times daily. 60 tablet 0   methylphenidate (RITALIN) 20 MG  tablet Take 1 tablet (20 mg total) by mouth 2 (two) times daily. 60 tablet 0   methylphenidate (RITALIN) 20 MG tablet Take 1 tablet (20 mg total) by mouth 2 (two) times daily. 60 tablet 0   methylphenidate (RITALIN) 20 MG tablet Take 1 tablet (20 mg total) by mouth 2 (two) times daily. 60 tablet 0   Multiple Vitamin (MULTIVITAMIN ADULT) TABS 1 tablet     sertraline (ZOLOFT) 100 MG tablet Take 2 tablets (200 mg total) by mouth at bedtime. 180 tablet 1   traMADol (ULTRAM) 50 MG tablet 1 tablet as needed     No current facility-administered medications for this visit.     Musculoskeletal: Strength & Muscle Tone:  within normal limits Gait & Station: normal Patient leans: N/A  Psychiatric Specialty Exam: Review of Systems  There were no vitals taken for this visit.There is no height or weight on file to calculate BMI.  General Appearance: {Appearance:22683}  Eye Contact:  {BHH EYE CONTACT:22684}  Speech:  Clear and Coherent  Volume:  Normal  Mood:  {BHH MOOD:22306}  Affect:  {Affect (PAA):22687}  Thought Process:  Coherent  Orientation:  Full (Time, Place, and Person)  Thought Content: Logical   Suicidal Thoughts:  {ST/HT (PAA):22692}  Homicidal Thoughts:  {ST/HT (PAA):22692}  Memory:  Immediate;   Good  Judgement:  {Judgement (PAA):22694}  Insight:  {Insight (PAA):22695}  Psychomotor Activity:  Normal  Concentration:  Concentration: Good and Attention Span: Good  Recall:  Good  Fund of Knowledge: Good  Language: Good  Akathisia:  No  Handed:  Right  AIMS (if indicated): not done  Assets:  Communication Skills Desire for Improvement  ADL's:  Intact  Cognition: WNL  Sleep:  {BHH GOOD/FAIR/POOR:22877}   Screenings: PHQ2-9    Flowsheet Row Video Visit from 10/20/2020 in [facility] Video Visit from 07/25/2020 in [facility]  PHQ-2 Total Score 0 0      Flowsheet Row Video Visit from 01/03/2021 in [place] Video Visit from 10/20/2020 in [facility] ED to Hosp-Admission (Discharged) from 09/06/2020 in [place] No Risk No Risk        Assessment and Plan:  Gibson Lad is a 42 y.o. year old male with a history of depression,  ADHD,  hypertension,  hyperlipidemia, hypothyroidism, obesity, who presents for follow up appointment for below.    1. MDD (major depressive disorder), recurrent, in full remission ([place]) 2. Anxiety He denies significant mood symptoms since the last visit.  Psychosocial  stressors includes conflict with his brother, who has not contacted with him for almost an year, and his niece moving out to her mother's.  Will continue current dose of sertraline and bupropion as maintenance treatment for depression.  He verbalized understanding to stay on this regimen and work on tapering down clonazepam.  Although he is willing to try tapering down clonazepam, he would like to wait at this time while working on weight loss.    3. Attention deficit hyperactivity disorder (ADHD), unspecified ADHD type He reportedly had 3 psychological testing, first at age 78.  He reports great benefit from within and without side effect.  Will do current dose to target ADHD.  Will obtain UDS for screening.  He verbalized understanding.      Plan Continue sertraline 200 mg daily Continue bupropion 450 mg daily (150 mg + 300 mg )  Continue clonazepam 2  mg at night as needed for anxiety Continue Ritalin 20 mg twice a day  Obtain UDS Next appointment: 1/4 at 10 AM, in person     Past trials of medication: sertraline, Effexor (had "zaps"), Wellbutrin, Adderall, Adderall XR, Vyvanse, Concerta, Strattera (limited benefit),    The patient demonstrates the following risk factors for suicide: Chronic risk factors for suicide include: psychiatric disorder of depression. Acute risk factors for suicide include: N/A. Protective factors for this patient include: positive social support, coping skills and hope for the future. Considering these factors, the overall suicide risk at this point appears to be low. Patient is appropriate for outpatient follow up., who presents for follow up appointment for below.               Collaboration of Care: Collaboration of Care: {BH OP Collaboration of Care:21014065}  Patient/Guardian was advised Release of Information must be obtained prior to any record release in order to collaborate their care with an outside provider. Patient/Guardian was advised if they have  not already done so to contact the registration department to sign all necessary forms in order for [place] to release information regarding their care.   Consent: Patient/Guardian gives verbal consent for treatment and assignment of benefits for services provided during this visit. Patient/Guardian expressed understanding and agreed to proceed.    [name], MD 04/23/2022, 10:54 AM

## 2022-04-25 ENCOUNTER — Ambulatory Visit: Payer: No Typology Code available for payment source | Admitting: Psychiatry

## 2022-05-29 NOTE — Progress Notes (Unsigned)
[place] MD/PA/NP OP Progress Note  05/29/2022 12:55 PM Jon Beck  MRN:  [id number]  Chief Complaint: No chief complaint on file.  HPI: *** Visit Diagnosis: No diagnosis found.  Past Psychiatric History: Please see initial evaluation for full details. I have reviewed the history. No updates at this time.     Past Medical History:  Past Medical History:  Diagnosis Date   CES (cauda equina syndrome) ([place])    Hypertension     Past Surgical History:  Procedure Laterality Date   LUMBAR LAMINECTOMY/DECOMPRESSION MICRODISCECTOMY Right 09/07/2020   Procedure: LUMBAR LAMINECTOMY/DECOMPRESSION MICRODISCECTOMY LUMBAR FOUR-FIVE;  Surgeon: Vallarie Mare, MD;  Location: [facility];  Service: Neurosurgery;  Laterality: Right;    Family Psychiatric History: Please see initial evaluation for full details. I have reviewed the history. No updates at this time.     Family History: No family history on file.  Social History:  Social History   Socioeconomic History   Marital status: Single    Spouse name: Not on file   Number of children: Not on file   Years of education: Not on file   Highest education level: Not on file  Occupational History   Not on file  Tobacco Use   Smoking status: Never   Smokeless tobacco: Never  Substance and Sexual Activity   Alcohol use: Yes    Comment: Socail    Drug use: No   Sexual activity: Not on file  Other Topics Concern   Not on file  Social History Narrative   Not on file   Social Determinants of Health   Financial Resource Strain: Not on file  Food Insecurity: Not on file  Transportation Needs: Not on file  Physical Activity: Not on file  Stress: Not on file  Social Connections: Not on file    Allergies: No Known Allergies  Metabolic Disorder Labs: No results found for: "HGBA1C", "MPG" No results found for: "PROLACTIN" No results found for: "CHOL", "TRIG", "HDL", "CHOLHDL", "VLDL", "LDLCALC" No results found for: "TSH"  Therapeutic  Level Labs: No results found for: "LITHIUM" No results found for: "VALPROATE" No results found for: "CBMZ"  Current Medications: Current Outpatient Medications  Medication Sig Dispense Refill   Ascorbic Acid (VITAMIN C) 1000 MG tablet 1 tablet     buPROPion (WELLBUTRIN XL) 150 MG 24 hr tablet Take 1 tablet (150 mg total) by mouth daily. Take total of 450 mg daily. Take along with 300 mg tab 90 tablet 1   clonazePAM (KLONOPIN) 2 MG tablet Take 1 tablet (2 mg total) by mouth at bedtime as needed for anxiety. 5 tablet 0   clonazePAM (KLONOPIN) 2 MG tablet Take 1 tablet (2 mg total) by mouth at bedtime. 30 tablet 2   cyclobenzaprine (FLEXERIL) 10 MG tablet Take 1 tablet (10 mg total) by mouth 3 (three) times daily as needed for up to 30 doses for muscle spasms. 30 tablet 0   docusate sodium (COLACE) 100 MG capsule Take 1 capsule (100 mg total) by mouth 2 (two) times daily. 10 capsule 0   ibuprofen (ADVIL,MOTRIN) 200 MG tablet Take 200 mg by mouth 3 (three) times daily as needed.     levothyroxine (SYNTHROID, LEVOTHROID) 75 MCG tablet Take 75 mcg by mouth daily before breakfast. 1 whole tablet (weekdays Mon-Fri) 1.5 tabs on weekends (Sat-Sun)     methylphenidate (RITALIN) 20 MG tablet Take 1 tablet (20 mg total) by mouth 2 (two) times daily. 60 tablet 0   methylphenidate (RITALIN) 20 MG  tablet Take 1 tablet (20 mg total) by mouth 2 (two) times daily. 60 tablet 0   methylphenidate (RITALIN) 20 MG tablet Take 1 tablet (20 mg total) by mouth 2 (two) times daily. 60 tablet 0   methylphenidate (RITALIN) 20 MG tablet Take 1 tablet (20 mg total) by mouth 2 (two) times daily. 60 tablet 0   Multiple Vitamin (MULTIVITAMIN ADULT) TABS 1 tablet     sertraline (ZOLOFT) 100 MG tablet Take 2 tablets (200 mg total) by mouth at bedtime. 180 tablet 1   traMADol (ULTRAM) 50 MG tablet 1 tablet as needed     No current facility-administered medications for this visit.     Musculoskeletal: Strength & Muscle Tone:   N/A Gait & Station:  N/A Patient leans: N/A  Psychiatric Specialty Exam: Review of Systems  There were no vitals taken for this visit.There is no height or weight on file to calculate BMI.  General Appearance: {Appearance:22683}  Eye Contact:  {BHH EYE CONTACT:22684}  Speech:  Clear and Coherent  Volume:  Normal  Mood:  {BHH MOOD:22306}  Affect:  {Affect (PAA):22687}  Thought Process:  Coherent  Orientation:  Full (Time, Place, and Person)  Thought Content: Logical   Suicidal Thoughts:  {ST/HT (PAA):22692}  Homicidal Thoughts:  {ST/HT (PAA):22692}  Memory:  Immediate;   Good  Judgement:  {Judgement (PAA):22694}  Insight:  {Insight (PAA):22695}  Psychomotor Activity:  Normal  Concentration:  Concentration: Good and Attention Span: Good  Recall:  Good  Fund of Knowledge: Good  Language: Good  Akathisia:  No  Handed:  Right  AIMS (if indicated): not done  Assets:  Communication Skills Desire for Improvement  ADL's:  Intact  Cognition: WNL  Sleep:  {BHH GOOD/FAIR/POOR:22877}   Screenings: PHQ2-9    Flowsheet Row Video Visit from 10/20/2020 in [facility] Video Visit from 07/25/2020 in [facility]  PHQ-2 Total Score 0 0      Flowsheet Row Video Visit from 01/03/2021 in [facility] Video Visit from 10/20/2020 in [facility] ED to Hosp-Admission (Discharged) from 09/06/2020 in [facility] No Risk No Risk        Assessment and Plan:  Jon Beck is a 42 y.o. year old male with a history of depression,  ADHD,  hypertension,  hyperlipidemia, hypothyroidism, obesity , who presents for follow up appointment for below.   Acute stressors include:  issues around his niece (currently lives at his mother's)  Other stressors include: conflict with his  brother    History:     1. MDD (major depressive disorder), recurrent, in full remission ([place]) 2. Anxiety He denies significant mood symptoms since the last visit.  Psychosocial stressors includes conflict with his brother, who has not contacted with him for almost an year, and his niece moving out to her mother's.  Will continue current dose of sertraline and bupropion as maintenance treatment for depression.  He verbalized understanding to stay on this regimen and work on tapering down clonazepam.  Although he is willing to try tapering down clonazepam, he would like to wait at this time while working on weight loss.    3. Attention deficit hyperactivity disorder (ADHD), unspecified ADHD type He reportedly had 3 psychological testing, first at age 22.  He reports great benefit from within and without side effect.  Will do current dose to target  ADHD.  Will obtain UDS for screening.  He verbalized understanding.      Plan Continue sertraline 200 mg daily Continue bupropion 450 mg daily (150 mg + 300 mg )  Continue clonazepam 2 mg at night as needed for anxiety Continue Ritalin 20 mg twice a day  Obtain UDS Next appointment: 1/4 at 10 AM, in person     Past trials of medication: sertraline, Effexor (had "zaps"), Wellbutrin, Adderall, Adderall XR, Vyvanse, Concerta, Strattera (limited benefit),    The patient demonstrates the following risk factors for suicide: Chronic risk factors for suicide include: psychiatric disorder of depression. Acute risk factors for suicide include: N/A. Protective factors for this patient include: positive social support, coping skills and hope for the future. Considering these factors, the overall suicide risk at this point appears to be low. Patient is appropriate for outpatient follow up., who presents for follow up appointment for below.         Collaboration of Care: Collaboration of Care: {BH OP Collaboration of Care:21014065}  Patient/Guardian was  advised Release of Information must be obtained prior to any record release in order to collaborate their care with an outside provider. Patient/Guardian was advised if they have not already done so to contact the registration department to sign all necessary forms in order for [place] to release information regarding their care.   Consent: Patient/Guardian gives verbal consent for treatment and assignment of benefits for services provided during this visit. Patient/Guardian expressed understanding and agreed to proceed.    Norman Clay, MD 05/29/2022, 12:55 PM

## 2022-05-30 ENCOUNTER — Telehealth: Payer: Self-pay | Admitting: Psychiatry

## 2022-05-30 ENCOUNTER — Encounter: Payer: 59 | Admitting: Psychiatry

## 2022-05-30 NOTE — Telephone Encounter (Signed)
Sent link for video visit through Epic. Patient did not sign in. Called the patient for appointment scheduled today. The patient did not answer the phone. Left voice message to contact the office ([phone]).   ?

## 2022-05-30 NOTE — Progress Notes (Signed)
This encounter was created in error - please disregard.

## 2022-06-05 NOTE — Progress Notes (Signed)
Virtual Visit via Video Note  I connected with Jon Beck on 06/07/22 at 11:30 AM EST by a video enabled telemedicine application and verified that I am speaking with the correct person using two identifiers.  Location: Patient: work Provider: office Persons participated in the visit- patient, provider    I discussed the limitations of evaluation and management by telemedicine and the availability of in person appointments. The patient expressed understanding and agreed to proceed.     I discussed the assessment and treatment plan with the patient. The patient was provided an opportunity to ask questions and all were answered. The patient agreed with the plan and demonstrated an understanding of the instructions.   The patient was advised to call back or seek an in-person evaluation if the symptoms worsen or if the condition fails to improve as anticipated.  I provided 15 minutes of non-face-to-face time during this encounter.   Norman Clay, MD    [facility] MD/PA/NP OP Progress Note  06/07/2022 12:11 PM Jon Beck  MRN:  [id number]  Chief Complaint:  Chief Complaint  Patient presents with   Follow-up   HPI:  This is a follow-up appointment for depression, ADHD.  He states that he has been busy at work.  There has been reassigning duties.  He now commutes to [facility] every day.  He likes it except that it would be perfect if he were to live closer there.  He has been able to be on top of things without issues with focus.  He was sick that in December, and was by himself on Christmas.  He was able to see his parents the other time.  He is concerned about their health.  He denies feeling depressed.  He has been working on eating healthy food.  He is very active at work.  He sleeps up to 9 hours.  He denies SI.  He wants to be off clonazepam as he is concerned of his potential sexual side effect.  It has been getting worse over the past several months.  He verbalized understanding that  antidepressant can cause this side effect as well.  He is willing to reduce clonazepam at this time.  He denies alcohol use or drug use.   260 lbs Wt Readings from Last 3 Encounters:  09/06/20 260 lb (117.9 kg)  08/26/20 235 lb (106.6 kg)  06/04/18 272 lb (123.4 kg)     Visit Diagnosis:    ICD-10-CM   1. MDD (major depressive disorder), recurrent, in full remission ([place])  F33.42     2. Anxiety  F41.9     3. Attention deficit hyperactivity disorder (ADHD), unspecified ADHD type  F90.9       Past Psychiatric History: Please see initial evaluation for full details. I have reviewed the history. No updates at this time.     Past Medical History:  Past Medical History:  Diagnosis Date   CES (cauda equina syndrome) ([place])    Hypertension     Past Surgical History:  Procedure Laterality Date   LUMBAR LAMINECTOMY/DECOMPRESSION MICRODISCECTOMY Right 09/07/2020   Procedure: LUMBAR LAMINECTOMY/DECOMPRESSION MICRODISCECTOMY LUMBAR FOUR-FIVE;  Surgeon: Vallarie Mare, MD;  Location: [facility];  Service: Neurosurgery;  Laterality: Right;    Family Psychiatric History: Please see initial evaluation for full details. I have reviewed the history. No updates at this time.     Family History: No family history on file.  Social History:  Social History   Socioeconomic History   Marital status: Single  Spouse name: Not on file   Number of children: Not on file   Years of education: Not on file   Highest education level: Not on file  Occupational History   Not on file  Tobacco Use   Smoking status: Never   Smokeless tobacco: Never  Substance and Sexual Activity   Alcohol use: Yes    Comment: Socail    Drug use: No   Sexual activity: Not on file  Other Topics Concern   Not on file  Social History Narrative   Not on file   Social Determinants of Health   Financial Resource Strain: Not on file  Food Insecurity: Not on file  Transportation Needs: Not on file  Physical  Activity: Not on file  Stress: Not on file  Social Connections: Not on file    Allergies: No Known Allergies  Metabolic Disorder Labs: No results found for: "HGBA1C", "MPG" No results found for: "PROLACTIN" No results found for: "CHOL", "TRIG", "HDL", "CHOLHDL", "VLDL", "LDLCALC" No results found for: "TSH"  Therapeutic Level Labs: No results found for: "LITHIUM" No results found for: "VALPROATE" No results found for: "CBMZ"  Current Medications: Current Outpatient Medications  Medication Sig Dispense Refill   buPROPion (WELLBUTRIN XL) 300 MG 24 hr tablet Take 300 mg by mouth daily. Total of 450 mg daily     clonazePAM (KLONOPIN) 1 MG tablet Take 1.5 tablets (1.5 mg total) by mouth at bedtime as needed for anxiety. 45 tablet 2   methylphenidate (RITALIN) 20 MG tablet Take 1 tablet (20 mg total) by mouth 2 (two) times daily. 60 tablet 0   [START ON 07/07/2022] methylphenidate (RITALIN) 20 MG tablet Take 1 tablet (20 mg total) by mouth 2 (two) times daily. 60 tablet 0   [START ON 08/06/2022] methylphenidate (RITALIN) 20 MG tablet Take 1 tablet (20 mg total) by mouth 2 (two) times daily. 60 tablet 0   Ascorbic Acid (VITAMIN C) 1000 MG tablet 1 tablet     buPROPion (WELLBUTRIN XL) 150 MG 24 hr tablet Take 1 tablet (150 mg total) by mouth daily. Take total of 450 mg daily. Take along with 300 mg tab 90 tablet 1   cyclobenzaprine (FLEXERIL) 10 MG tablet Take 1 tablet (10 mg total) by mouth 3 (three) times daily as needed for up to 30 doses for muscle spasms. 30 tablet 0   docusate sodium (COLACE) 100 MG capsule Take 1 capsule (100 mg total) by mouth 2 (two) times daily. 10 capsule 0   ibuprofen (ADVIL,MOTRIN) 200 MG tablet Take 200 mg by mouth 3 (three) times daily as needed.     levothyroxine (SYNTHROID, LEVOTHROID) 75 MCG tablet Take 75 mcg by mouth daily before breakfast. 1 whole tablet (weekdays Mon-Fri) 1.5 tabs on weekends (Sat-Sun)     methylphenidate (RITALIN) 20 MG tablet Take 1  tablet (20 mg total) by mouth 2 (two) times daily. 60 tablet 0   methylphenidate (RITALIN) 20 MG tablet Take 1 tablet (20 mg total) by mouth 2 (two) times daily. 60 tablet 0   methylphenidate (RITALIN) 20 MG tablet Take 1 tablet (20 mg total) by mouth 2 (two) times daily. 60 tablet 0   methylphenidate (RITALIN) 20 MG tablet Take 1 tablet (20 mg total) by mouth 2 (two) times daily. 60 tablet 0   Multiple Vitamin (MULTIVITAMIN ADULT) TABS 1 tablet     sertraline (ZOLOFT) 100 MG tablet Take 2 tablets (200 mg total) by mouth at bedtime. 180 tablet 1   traMADol (ULTRAM)  50 MG tablet 1 tablet as needed     No current facility-administered medications for this visit.     Musculoskeletal: Strength & Muscle Tone:  N/A Gait & Station:  N/A Patient leans: N/A  Psychiatric Specialty Exam: Review of Systems  Psychiatric/Behavioral: Negative.    All other systems reviewed and are negative.   There were no vitals taken for this visit.There is no height or weight on file to calculate BMI.  General Appearance: Fairly Groomed  Eye Contact:  Good  Speech:  Clear and Coherent  Volume:  Normal  Mood:   good  Affect:  Appropriate, Congruent, and calm  Thought Process:  Coherent  Orientation:  Full (Time, Place, and Person)  Thought Content: Logical   Suicidal Thoughts:  No  Homicidal Thoughts:  No  Memory:  Immediate;   Good  Judgement:  Good  Insight:  Good  Psychomotor Activity:  Normal  Concentration:  Concentration: Good and Attention Span: Good  Recall:  Good  Fund of Knowledge: Good  Language: Good  Akathisia:  No  Handed:  Right  AIMS (if indicated): not done  Assets:  Communication Skills Desire for Improvement  ADL's:  Intact  Cognition: WNL  Sleep:  Good   Screenings: PHQ2-9    Flowsheet Row Video Visit from 10/20/2020 in [facility] Video Visit from 07/25/2020 in [facility]  PHQ-2 Total Score 0  0      Flowsheet Row Video Visit from 01/03/2021 in [facility] Video Visit from 10/20/2020 in [facility] ED to Hosp-Admission (Discharged) from 09/06/2020 in [facility] No Risk No Risk        Assessment and Plan:  Jon Beck is a 42 y.o. year old male with a history of depression,  ADHD,  hypertension,  hyperlipidemia, hypothyroidism, obesity , who presents for follow up appointment for below.   1. MDD (major depressive disorder), recurrent, in full remission ([place]) 2. Anxiety Acute stressors include: commute to [place] every day  Other stressors include: health of his parents, conflict with his brother, his niece, who lives with her boyfriend   History:   He denies any significant mood symptoms since the last visit.  Will continue sertraline to target depression and anxiety.  Will continue clonazepam as needed for anxiety.   # Benzodiazepine dependence He Is willing to work on tapering off clonazepam.  Will slowly reduce the dose.   3. Attention deficit hyperactivity disorder (ADHD), unspecified ADHD type  He reportedly had 3 psychological testing, first at age 49.  He reports great benefit from Ritalin.  Will continue current dose to target ADHD.    Plan Continue sertraline 200 mg daily Continue bupropion 450 mg daily (150 mg + 300 mg )  Decrease clonazepam 1.5 mg at night as needed for anxiety Continue Ritalin 20 mg twice a day  Next appointment: 4/18 at 8 AM, in person     Past trials of medication: sertraline, Effexor (had "zaps"), Wellbutrin, Adderall, Adderall XR, Vyvanse, Concerta, Strattera (limited benefit),    The patient demonstrates the following risk factors for suicide: Chronic risk factors for suicide include: psychiatric disorder of depression. Acute risk factors for suicide include: N/A. Protective factors for  this patient include: positive social support, coping skills and hope for the future. Considering these factors, the overall suicide risk at this point appears to  be low. Patient is appropriate for outpatient follow up., who presents for follow up appointment for below.           Collaboration of Care: Collaboration of Care: Other reviewed notes in Epic  Patient/Guardian was advised Release of Information must be obtained prior to any record release in order to collaborate their care with an outside provider. Patient/Guardian was advised if they have not already done so to contact the registration department to sign all necessary forms in order for [place] to release information regarding their care.   Consent: Patient/Guardian gives verbal consent for treatment and assignment of benefits for services provided during this visit. Patient/Guardian expressed understanding and agreed to proceed.    Norman Clay, MD 06/07/2022, 12:11 PM

## 2022-06-07 ENCOUNTER — Encounter: Payer: Self-pay | Admitting: Psychiatry

## 2022-06-07 ENCOUNTER — Telehealth: Payer: Self-pay | Admitting: Psychiatry

## 2022-06-07 ENCOUNTER — Telehealth (INDEPENDENT_AMBULATORY_CARE_PROVIDER_SITE_OTHER): Payer: 59 | Admitting: Psychiatry

## 2022-06-07 DIAGNOSIS — F909 Attention-deficit hyperactivity disorder, unspecified type: Secondary | ICD-10-CM

## 2022-06-07 DIAGNOSIS — F3342 Major depressive disorder, recurrent, in full remission: Secondary | ICD-10-CM | POA: Diagnosis not present

## 2022-06-07 DIAGNOSIS — F419 Anxiety disorder, unspecified: Secondary | ICD-10-CM | POA: Diagnosis not present

## 2022-06-07 MED ORDER — CLONAZEPAM 1 MG PO TABS: 1.5000 mg | ORAL_TABLET | Freq: Every evening | ORAL | 2 refills | Status: DC | PRN

## 2022-06-07 MED ORDER — METHYLPHENIDATE HCL 20 MG PO TABS: 20.0000 mg | ORAL_TABLET | Freq: Two times a day (BID) | ORAL | 0 refills | Status: DC

## 2022-06-07 NOTE — Telephone Encounter (Signed)
Could you contact [facility] in [place] and cancel clonazepam 2 mg order? 1 mg order to take 1.5 mg daily is sent today, fyi. Thanks.

## 2022-06-07 NOTE — Patient Instructions (Signed)
Continue sertraline 200 mg daily Continue bupropion 450 mg daily (150 mg + 300 mg )  Decrease clonazepam 1.5 mg at night as needed for anxiety Continue Ritalin 20 mg twice a day  Next appointment: 4/18 at 8 AM

## 2022-06-10 NOTE — Telephone Encounter (Signed)
spoke with Laurence Ferrari at the pharmacy she canceled the 78m

## 2022-08-04 NOTE — Progress Notes (Unsigned)
BH MD/PA/NP OP Progress Note  08/08/2022 8:37 AM Jon Beck  MRN:  [id number]  Chief Complaint:  Chief Complaint  Patient presents with   Follow-up   HPI:  This is a follow-up appointment for depression, anxiety and insomnia.  He states that he has been doing well.  He has been busy at work, although he has been handling things well.  He is planning to visit his parents in several weeks.  He is aware of weight gain.  He attributes this to stress eating, although he denies binge eating.  He has been trying to eat cooked meal, and not going to the place after work.  He eats a big salad during the lunch time.  He denies feeling depressed.  Although he was anxious about tapering down clonazepam due to his previous experience, it has been going well except he experienced mild insomnia for the first few weeks.  He sleeps 8 hours and feels refreshed in the morning.  He denies SI.  He is willing to taper down clonazepam further.  He tries to take less Ritalin as he does not want to depend on the medication.  He drinks vodka up to 2 cups, occasionally.  He denies drug use.   HBP 125/70-80  142/93 07/03/2022    Wt Readings from Last 3 Encounters:  08/08/22 279 lb 9.6 oz (126.8 kg)  09/06/20 260 lb (117.9 kg)  08/26/20 235 lb (106.6 kg)     Visit Diagnosis:    ICD-10-CM   1. Attention deficit hyperactivity disorder (ADHD), unspecified ADHD type  F90.9 Drug Screen, Urine    2. MDD (major depressive disorder), recurrent, in full remission  F33.42 buPROPion (WELLBUTRIN XL) 150 MG 24 hr tablet    3. Anxiety  F41.9 sertraline (ZOLOFT) 100 MG tablet      Past Psychiatric History: Please see initial evaluation for full details. I have reviewed the history. No updates at this time.     Past Medical History:  Past Medical History:  Diagnosis Date   CES (cauda equina syndrome)    Hypertension     Past Surgical History:  Procedure Laterality Date   LUMBAR LAMINECTOMY/DECOMPRESSION  MICRODISCECTOMY Right 09/07/2020   Procedure: LUMBAR LAMINECTOMY/DECOMPRESSION MICRODISCECTOMY LUMBAR FOUR-FIVE;  Surgeon: Bedelia Person, MD;  Location: [facility] OR;  Service: Neurosurgery;  Laterality: Right;    Family Psychiatric History: Please see initial evaluation for full details. I have reviewed the history. No updates at this time.     Family History: History reviewed. No pertinent family history.  Social History:  Social History   Socioeconomic History   Marital status: Single    Spouse name: Not on file   Number of children: Not on file   Years of education: Not on file   Highest education level: Not on file  Occupational History   Not on file  Tobacco Use   Smoking status: Never   Smokeless tobacco: Never  Substance and Sexual Activity   Alcohol use: Yes    Comment: Socail    Drug use: No   Sexual activity: Not on file  Other Topics Concern   Not on file  Social History Narrative   Not on file   Social Determinants of Health   Financial Resource Strain: Not on file  Food Insecurity: Not on file  Transportation Needs: Not on file  Physical Activity: Not on file  Stress: Not on file  Social Connections: Not on file    Allergies: No Known Allergies  Metabolic  Disorder Labs: No results found for: "HGBA1C", "MPG" No results found for: "PROLACTIN" No results found for: "CHOL", "TRIG", "HDL", "CHOLHDL", "VLDL", "LDLCALC" No results found for: "TSH"  Therapeutic Level Labs: No results found for: "LITHIUM" No results found for: "VALPROATE" No results found for: "CBMZ"  Current Medications: Current Outpatient Medications  Medication Sig Dispense Refill   Ascorbic Acid (VITAMIN C) 1000 MG tablet 1 tablet     clonazePAM (KLONOPIN) 1 MG tablet Take 1.5 tablets (1.5 mg total) by mouth at bedtime as needed for anxiety. 45 tablet 2   [START ON 08/24/2022] clonazePAM (KLONOPIN) 1 MG tablet Take 1 tablet (1 mg total) by mouth at bedtime. 30 tablet 1    methylphenidate (RITALIN) 20 MG tablet Take 1 tablet (20 mg total) by mouth 2 (two) times daily. 60 tablet 0   Multiple Vitamin (MULTIVITAMIN ADULT) TABS 1 tablet     [START ON 10/07/2022] buPROPion (WELLBUTRIN XL) 150 MG 24 hr tablet Take 1 tablet (150 mg total) by mouth daily. Take total of 450 mg daily. Take along with 300 mg tab 90 tablet 1   buPROPion (WELLBUTRIN XL) 300 MG 24 hr tablet Take 1 tablet (300 mg total) by mouth daily. Total of 450 mg daily. Take along with 150 mg tab 90 tablet 1   docusate sodium (COLACE) 100 MG capsule Take 1 capsule (100 mg total) by mouth 2 (two) times daily. (Patient not taking: Reported on 08/08/2022) 10 capsule 0   ibuprofen (ADVIL,MOTRIN) 200 MG tablet Take 200 mg by mouth 3 (three) times daily as needed. (Patient not taking: Reported on 08/08/2022)     methylphenidate (RITALIN) 20 MG tablet Take 1 tablet (20 mg total) by mouth 2 (two) times daily. 60 tablet 0   [START ON 09/23/2022] methylphenidate (RITALIN) 20 MG tablet Take 1 tablet (20 mg total) by mouth 2 (two) times daily. 60 tablet 0   [START ON 10/06/2022] sertraline (ZOLOFT) 100 MG tablet Take 2 tablets (200 mg total) by mouth at bedtime. 180 tablet 1   No current facility-administered medications for this visit.     Musculoskeletal: Strength & Muscle Tone:  Normal Gait & Station: normal Patient leans: N/A  Psychiatric Specialty Exam: Review of Systems  Psychiatric/Behavioral: Negative.    All other systems reviewed and are negative.   Blood pressure 139/86, pulse 86, temperature (!) 97.5 F (36.4 C), temperature source Skin, height  (1.803 m), weight 279 lb 9.6 oz (126.8 kg).Body mass index is 39 kg/m.  General Appearance: Fairly Groomed  Eye Contact:  Good  Speech:  Clear and Coherent  Volume:  Normal  Mood:   good  Affect:  Appropriate, Congruent, and calm  Thought Process:  Coherent  Orientation:  Full (Time, Place, and Person)  Thought Content: Logical   Suicidal Thoughts:   No  Homicidal Thoughts:  No  Memory:  Immediate;   Good  Judgement:  Good  Insight:  Good  Psychomotor Activity:  Normal  Concentration:  Concentration: Good and Attention Span: Good  Recall:  Good  Fund of Knowledge: Good  Language: Good  Akathisia:  No  Handed:  Right  AIMS (if indicated): not done  Assets:  Communication Skills Desire for Improvement  ADL's:  Intact  Cognition: WNL  Sleep:  Good   Screenings: PHQ2-9    Flowsheet Row Office Visit from 08/08/2022 in [facility] Video Visit from 10/20/2020 in [facility] Video Visit from 07/25/2020 in [facility]  PHQ-2 Total Score 0 0 0      Flowsheet Row Video Visit from 01/03/2021 in [facility] Video Visit from 10/20/2020 in [facility] ED to Hosp-Admission (Discharged) from 09/06/2020 in [facility]  C-SSRS RISK CATEGORY Low Risk No Risk No Risk        Assessment and Plan:  Saran Schueneman is a 42 y.o. year old male with a history of depression,  ADHD,  hypertension,  hyperlipidemia, hypothyroidism, obesity , who presents for follow up appointment for below.   1. MDD (major depressive disorder), recurrent, in full remission 2. Anxiety Acute stressors include: commute to [facility] every day  Other stressors include: health of his parents, conflict with his brother, his niece, who lives with her boyfriend   History:   He denies any significant mood symptoms except mild insomnia in the context of tapering down clonazepam.  Will continue sertraline and bupropion as maintenance treatment for depression.   3. Attention deficit hyperactivity disorder (ADHD), unspecified ADHD type -  He reportedly had 3 psychological testing, first at age 31.    Stable.  He has been taking less Ritalin when able.   Will continue current dose of Ritalin at this time to target ADHD.  Discussed potential risk of hypertension. Obtai UDS this time.  # Benzodiazepine dependence He has been successfully able to taper down clonazepam.  Will do further taper down slowly.   # weight gain He has been working on diet.  He takes a walk regularly.  Will continue to assess .   Plan Continue sertraline 200 mg daily Continue bupropion 450 mg daily (150 mg + 300 mg )  Decrease clonazepam 1 mg at night as needed for anxiety (reduced from 1.5 mg qhs 07/2022) Continue Ritalin 10-20 mg twice a day (he may try 10 mg daily to twice a day) Obtain UDS- [facility] Next appointment: 6/26 at 10 am, video - he was seen by PCP, had lab 06/2022 TSH 5.4, fT4 wnl utox    Past trials of medication: sertraline, Effexor (had "zaps"), Wellbutrin, Adderall, Adderall XR, Vyvanse, Concerta, Strattera (limited benefit),    The patient demonstrates the following risk factors for suicide: Chronic risk factors for suicide include: psychiatric disorder of depression. Acute risk factors for suicide include: N/A. Protective factors for this patient include: positive social support, coping skills and hope for the future. Considering these factors, the overall suicide risk at this point appears to be low. Patient is appropriate for outpatient follow up., who presents for follow up appointment for below.       Collaboration of Care: Collaboration of Care: Other reviewed notes in Epic  Patient/Guardian was advised Release of Information must be obtained prior to any record release in order to collaborate their care with an outside provider. Patient/Guardian was advised if they have not already done so to contact the registration department to sign all necessary forms in order for [place] to release information regarding their care.   Consent: Patient/Guardian gives verbal consent for treatment and assignment of benefits for services provided during this  visit. Patient/Guardian expressed understanding and agreed to proceed.    [name], MD 08/08/2022, 8:37 AM

## 2022-08-08 ENCOUNTER — Encounter: Payer: Self-pay | Admitting: Psychiatry

## 2022-08-08 ENCOUNTER — Ambulatory Visit (INDEPENDENT_AMBULATORY_CARE_PROVIDER_SITE_OTHER): Payer: 59 | Admitting: Psychiatry

## 2022-08-08 VITALS — BP 139/86 | HR 86 | Temp 97.5°F | Ht 71.0 in | Wt 279.6 lb

## 2022-08-08 DIAGNOSIS — F909 Attention-deficit hyperactivity disorder, unspecified type: Secondary | ICD-10-CM | POA: Diagnosis not present

## 2022-08-08 DIAGNOSIS — F3342 Major depressive disorder, recurrent, in full remission: Secondary | ICD-10-CM

## 2022-08-08 DIAGNOSIS — F419 Anxiety disorder, unspecified: Secondary | ICD-10-CM | POA: Diagnosis not present

## 2022-08-08 MED ORDER — CLONAZEPAM 1 MG PO TABS: 1.0000 mg | ORAL_TABLET | Freq: Every day | ORAL | 1 refills | Status: DC

## 2022-08-08 MED ORDER — BUPROPION HCL ER (XL) 150 MG PO TB24: 150.0000 mg | ORAL_TABLET | Freq: Every day | ORAL | 1 refills | Status: DC

## 2022-08-08 MED ORDER — BUPROPION HCL ER (XL) 300 MG PO TB24: 300.0000 mg | ORAL_TABLET | Freq: Every day | ORAL | 1 refills | Status: DC

## 2022-08-08 MED ORDER — SERTRALINE HCL 100 MG PO TABS: 200.0000 mg | ORAL_TABLET | Freq: Every day | ORAL | 1 refills | Status: DC

## 2022-08-08 MED ORDER — METHYLPHENIDATE HCL 20 MG PO TABS: 20.0000 mg | ORAL_TABLET | Freq: Two times a day (BID) | ORAL | 0 refills | Status: DC

## 2022-08-08 NOTE — Patient Instructions (Signed)
Continue sertraline 200 mg daily Continue bupropion 450 mg daily (150 mg + 300 mg )  Decrease clonazepam 1 mg at night as needed for anxiety Continue Ritalin 10-20 mg twice a day  Obtain UDS- [facility] Next appointment: 6/26 at 10 am

## 2022-09-13 ENCOUNTER — Other Ambulatory Visit: Payer: Self-pay | Admitting: Psychiatry

## 2022-09-13 ENCOUNTER — Telehealth: Payer: Self-pay

## 2022-09-13 MED ORDER — CLONAZEPAM 1 MG PO TABS: 1.5000 mg | ORAL_TABLET | Freq: Every evening | ORAL | 0 refills | Status: DC | PRN

## 2022-09-13 NOTE — Telephone Encounter (Signed)
Pt was notified that new rx was sent to pharmacy

## 2022-09-13 NOTE — Telephone Encounter (Signed)
called pharmacy and canceled the 1mg  take 1 tablet by mouth. and pharmacy was also told that the new rx had today date is to take 1.5 tablet is to be filled.

## 2022-09-13 NOTE — Telephone Encounter (Signed)
pt called states that he was suppose to go down on the klonopin 1mg  from the 2mg  but he states that he tried doing the 1mg  and he couldn't do it. he needs to do 1.5mg 

## 2022-09-17 ENCOUNTER — Telehealth: Payer: Self-pay | Admitting: Psychiatry

## 2022-09-17 NOTE — Telephone Encounter (Signed)
Please contact the pharmacy to ensure he can pick up the medication, clonazepam 1.5 mg daily. I ordered it a few days ago and included a comment regarding the need for a sooner fill. Please also provide an update on the patient after it is approved.

## 2022-09-17 NOTE — Telephone Encounter (Signed)
Patient called and said he could not pick up his prescription, pharmacy is requesting office to call pharmacy and allow approval due to dosage change. Patient would like a call back to know he can pick up his medication-please advise

## 2022-09-19 NOTE — Telephone Encounter (Signed)
Pt did pick up medication

## 2022-09-19 NOTE — Telephone Encounter (Signed)
Called pharmacy spoke to [place] she stated that the patient picked up the Clonazepam on 09/17/22.

## 2022-10-08 NOTE — Progress Notes (Signed)
Virtual Visit via Video Note  I connected with Jon Beck on 10/16/22 at 10:00 AM EDT by a video enabled telemedicine application and verified that I am speaking with the correct person using two identifiers.  Location: Patient: home Provider: office Persons participated in the visit- patient, provider    I discussed the limitations of evaluation and management by telemedicine and the availability of in person appointments. The patient expressed understanding and agreed to proceed.    I discussed the assessment and treatment plan with the patient. The patient was provided an opportunity to ask questions and all were answered. The patient agreed with the plan and demonstrated an understanding of the instructions.   The patient was advised to call back or seek an in-person evaluation if the symptoms worsen or if the condition fails to improve as anticipated.  I provided 24 minutes of non-face-to-face time during this encounter.   [name], MD     [facility] MD/PA/NP OP Progress Note  10/16/2022 10:38 AM Curt Oatis  MRN:  [id number]  Chief Complaint:  Chief Complaint  Patient presents with   Follow-up   HPI:  This is a follow-up appointment for depression, and ADHD.  He states that he has not been doing well.  He has been taking clonazepam 1.5 mg for the past month as he was not doing well on lower dose.  He still does not feel well.  He has been taking of from work.  He has not been able to go outside.  He feels less motivated, and has been in the bed almost every day.  He also ran out clonazepam for the few days, and was able to obtain the medication from pharmacy, who overrided the order considering his condition.Jon Beck  He noted that he could not send this Clinical research associate a message.  He agrees that this Clinical research associate will take him look into this.  He reports worsening in focus.  He cannot finish anything.  He feels disconnected.  He has insomnia and nightmares.  He denies SI.  He denies alcohol  use, drug use. He would like to try any medication which could be helpful.  He agrees with the plan as below.   Visit Diagnosis:    ICD-10-CM   1. MDD (major depressive disorder), recurrent episode, moderate (HCC)  F33.1     2. Anxiety  F41.9     3. Benzodiazepine dependence (HCC)  F13.20     4. Attention deficit hyperactivity disorder (ADHD), unspecified ADHD type  F90.9       Past Psychiatric History: Please see initial evaluation for full details. I have reviewed the history. No updates at this time.     Past Medical History:  Past Medical History:  Diagnosis Date   CES (cauda equina syndrome) (HCC)    Hypertension     Past Surgical History:  Procedure Laterality Date   LUMBAR LAMINECTOMY/DECOMPRESSION MICRODISCECTOMY Right 09/07/2020   Procedure: LUMBAR LAMINECTOMY/DECOMPRESSION MICRODISCECTOMY LUMBAR FOUR-FIVE;  Surgeon: Bedelia Person, MD;  Location: [facility] OR;  Service: Neurosurgery;  Laterality: Right;    Family Psychiatric History: Please see initial evaluation for full details. I have reviewed the history. No updates at this time.     Family History: No family history on file.  Social History:  Social History   Socioeconomic History   Marital status: Single    Spouse name: Not on file   Number of children: Not on file   Years of education: Not on file   Highest education level:  Not on file  Occupational History   Not on file  Tobacco Use   Smoking status: Never   Smokeless tobacco: Never  Substance and Sexual Activity   Alcohol use: Yes    Comment: Socail    Drug use: No   Sexual activity: Not on file  Other Topics Concern   Not on file  Social History Narrative   Not on file   Social Determinants of Health   Financial Resource Strain: Not on file  Food Insecurity: Not on file  Transportation Needs: Not on file  Physical Activity: Not on file  Stress: Not on file  Social Connections: Not on file    Allergies: No Known Allergies  Metabolic  Disorder Labs: No results found for: "HGBA1C", "MPG" No results found for: "PROLACTIN" No results found for: "CHOL", "TRIG", "HDL", "CHOLHDL", "VLDL", "LDLCALC" No results found for: "TSH"  Therapeutic Level Labs: No results found for: "LITHIUM" No results found for: "VALPROATE" No results found for: "CBMZ"  Current Medications: Current Outpatient Medications  Medication Sig Dispense Refill   ARIPiprazole (ABILIFY) 2 MG tablet Take 1 tablet (2 mg total) by mouth at bedtime. 30 tablet 1   Ascorbic Acid (VITAMIN C) 1000 MG tablet 1 tablet     buPROPion (WELLBUTRIN XL) 150 MG 24 hr tablet Take 1 tablet (150 mg total) by mouth daily. Take total of 450 mg daily. Take along with 300 mg tab 90 tablet 1   buPROPion (WELLBUTRIN XL) 300 MG 24 hr tablet Take 1 tablet (300 mg total) by mouth daily. Total of 450 mg daily. Take along with 150 mg tab 90 tablet 1   clonazePAM (KLONOPIN) 1 MG tablet Take 1 tablet (1 mg total) by mouth at bedtime. 30 tablet 1   [START ON 10/17/2022] clonazePAM (KLONOPIN) 1 MG tablet Take 1.5 tablets (1.5 mg total) by mouth at bedtime as needed for anxiety. 45 tablet 1   docusate sodium (COLACE) 100 MG capsule Take 1 capsule (100 mg total) by mouth 2 (two) times daily. (Patient not taking: Reported on 08/08/2022) 10 capsule 0   ibuprofen (ADVIL,MOTRIN) 200 MG tablet Take 200 mg by mouth 3 (three) times daily as needed. (Patient not taking: Reported on 08/08/2022)     methylphenidate (RITALIN) 20 MG tablet Take 1 tablet (20 mg total) by mouth 2 (two) times daily. 60 tablet 0   methylphenidate (RITALIN) 20 MG tablet Take 1 tablet (20 mg total) by mouth 2 (two) times daily. 60 tablet 0   [START ON 11/15/2022] methylphenidate (RITALIN) 20 MG tablet Take 1 tablet (20 mg total) by mouth 2 (two) times daily. 60 tablet 0   Multiple Vitamin (MULTIVITAMIN ADULT) TABS 1 tablet     sertraline (ZOLOFT) 100 MG tablet Take 2 tablets (200 mg total) by mouth at bedtime. 180 tablet 1   No  current facility-administered medications for this visit.     Musculoskeletal: Strength & Muscle Tone:  N/A Gait & Station:  N/A Patient leans: N/A  Psychiatric Specialty Exam: Review of Systems  Psychiatric/Behavioral:  Positive for decreased concentration, dysphoric mood and sleep disturbance. Negative for agitation, behavioral problems, confusion, hallucinations, self-injury and suicidal ideas. The patient is nervous/anxious. The patient is not hyperactive.   All other systems reviewed and are negative.   There were no vitals taken for this visit.There is no height or weight on file to calculate BMI.  General Appearance: Fairly Groomed  Eye Contact:  Good  Speech:  Clear and Coherent  Volume:  Normal  Mood:  Depressed  Affect:  Appropriate, Congruent, and down  Thought Process:  Coherent  Orientation:  Full (Time, Place, and Person)  Thought Content: Logical   Suicidal Thoughts:  No  Homicidal Thoughts:  No  Memory:  Immediate;   Good  Judgement:  Good  Insight:  Good  Psychomotor Activity:  Normal  Concentration:  Concentration: Good and Attention Span: Good  Recall:  Good  Fund of Knowledge: Good  Language: Good  Akathisia:  No  Handed:  Right  AIMS (if indicated): not done  Assets:  Communication Skills Desire for Improvement  ADL's:  Intact  Cognition: WNL  Sleep:  Poor   Screenings: PHQ2-9    Flowsheet Row Office Visit from 08/08/2022 in [facility] Video Visit from 10/20/2020 in [facility] Video Visit from 07/25/2020 in [facility]  PHQ-2 Total Score 0 0 0      Flowsheet Row Video Visit from 01/03/2021 in [facility] Video Visit from 10/20/2020 in [facility] ED to Hosp-Admission (Discharged) from 09/06/2020 in [facility]   C-SSRS RISK CATEGORY Low Risk No Risk No Risk        Assessment and Plan:  Jon Beck is a 42 y.o. year old male with a history of depression,  ADHD,  hypertension,  hyperlipidemia, hypothyroidism, obesity , who presents for follow up appointment for below.   1. MDD (major depressive disorder), recurrent, moderate (HCC) 2. Anxiety Acute stressors include: commute to [facility] every day  Other stressors include: health of his parents, conflict with his brother, his niece, who lives with her boyfriend   History:   There has been significant worsening in depressive symptoms and anxiety in the context of tapering down clonazepam.  Will add Abilify as adjunctive treatment for depression.  Discussed potential metabolic side effect, EPS and QTc prolongation. Will consider uptitration of clonazepam if he has limited benefit from this.  3. Benzodiazepine dependence (HCC) -Worsening in depression/anxiety in the context of tapering down clonazepam from 1.5 mg to 1 mg, tapered down from 2 mg in Feb 2024 Significant worsening in his mood symptoms as described above.  Will continue current dose of clonazepam at this time.  Noted that he has been willing to taper down the medication, and there has been no sign of misuse of the medication.   4. Attention deficit hyperactivity disorder (ADHD), unspecified ADHD type -  He reportedly had 3 psychological testing, first at age 31.     Worsening in concentration in the setting of worsening in his mood symptoms.  He is advised to hold the Ritalin if it causes any side effect.  Will continue current dose at this time to target ADHD.    Plan Continue sertraline 200 mg daily Continue bupropion 450 mg daily (150 mg + 300 mg )  Start Abilify 2 mg at night (EKG HR  98, QTc 426 msec, 06/2022) Continue clonazepam 1.5  mg at night as needed for anxiety  Continue Ritalin 10-20 mg twice a day (he may try 10 mg daily to twice a day) Obtain UDS- [facility] Next  appointment: 8/21 at 10 am for 30 mins, video - he was seen by PCP, had lab 06/2022 TSH 5.4, fT4 wnl   Past trials of medication: sertraline, Effexor (had "zaps"), Wellbutrin, Adderall, Adderall XR, Vyvanse, Concerta, Strattera (limited benefit),    The patient demonstrates  the following risk factors for suicide: Chronic risk factors for suicide include: psychiatric disorder of depression. Acute risk factors for suicide include: N/A. Protective factors for this patient include: positive social support, coping skills and hope for the future. Considering these factors, the overall suicide risk at this point appears to be low. Patient is appropriate for outpatient follow up., who presents for follow up appointment for below.     Collaboration of Care: Collaboration of Care: Other reviewed notes in Epic  Patient/Guardian was advised Release of Information must be obtained prior to any record release in order to collaborate their care with an outside provider. Patient/Guardian was advised if they have not already done so to contact the registration department to sign all necessary forms in order for [place] to release information regarding their care.   Consent: Patient/Guardian gives verbal consent for treatment and assignment of benefits for services provided during this visit. Patient/Guardian expressed understanding and agreed to proceed.    [name], MD 10/16/2022, 10:38 AM

## 2022-10-16 ENCOUNTER — Telehealth (INDEPENDENT_AMBULATORY_CARE_PROVIDER_SITE_OTHER): Payer: 59 | Admitting: Psychiatry

## 2022-10-16 ENCOUNTER — Encounter: Payer: Self-pay | Admitting: Psychiatry

## 2022-10-16 DIAGNOSIS — F1320 Sedative, hypnotic or anxiolytic dependence, uncomplicated: Secondary | ICD-10-CM

## 2022-10-16 DIAGNOSIS — F419 Anxiety disorder, unspecified: Secondary | ICD-10-CM

## 2022-10-16 DIAGNOSIS — F331 Major depressive disorder, recurrent, moderate: Secondary | ICD-10-CM | POA: Diagnosis not present

## 2022-10-16 DIAGNOSIS — F909 Attention-deficit hyperactivity disorder, unspecified type: Secondary | ICD-10-CM | POA: Diagnosis not present

## 2022-10-16 MED ORDER — CLONAZEPAM 1 MG PO TABS: 1.5000 mg | ORAL_TABLET | Freq: Every evening | ORAL | 1 refills | Status: DC | PRN

## 2022-10-16 MED ORDER — ARIPIPRAZOLE 2 MG PO TABS: 2.0000 mg | ORAL_TABLET | Freq: Every day | ORAL | 1 refills | Status: DC

## 2022-10-16 MED ORDER — METHYLPHENIDATE HCL 20 MG PO TABS: 20.0000 mg | ORAL_TABLET | Freq: Two times a day (BID) | ORAL | 0 refills | Status: DC

## 2022-10-16 NOTE — Patient Instructions (Signed)
Continue sertraline 200 mg daily Continue bupropion 450 mg daily (150 mg + 300 mg )  Start Abilify 2 mg at night  Continue clonazepam 1.5  mg at night as needed for anxiety  Continue Ritalin 10-20 mg twice a day  Obtain UDS- [facility] Next appointment: 8/21 at 10 am

## 2022-11-07 ENCOUNTER — Other Ambulatory Visit: Payer: Self-pay | Admitting: Psychiatry

## 2022-11-30 ENCOUNTER — Other Ambulatory Visit: Payer: Self-pay | Admitting: Psychiatry

## 2022-12-08 NOTE — Progress Notes (Unsigned)
Virtual Visit via Video Note  I connected with Jon Beck on 12/11/22 at 10:00 AM EDT by a video enabled telemedicine application and verified that I am speaking with the correct person using two identifiers.  Location: Patient: car Provider: office Persons participated in the visit- patient, provider    I discussed the limitations of evaluation and management by telemedicine and the availability of in person appointments. The patient expressed understanding and agreed to proceed.     I discussed the assessment and treatment plan with the patient. The patient was provided an opportunity to ask questions and all were answered. The patient agreed with the plan and demonstrated an understanding of the instructions.   The patient was advised to call back or seek an in-person evaluation if the symptoms worsen or if the condition fails to improve as anticipated.  I provided 30 minutes of non-face-to-face time during this encounter.   [name], MD   [facility] MD/PA/NP OP Progress Note  12/11/2022 10:41 AM Jon Beck  MRN:  [id number]  Chief Complaint:  Chief Complaint  Patient presents with   Follow-up   HPI:  This is a follow-up appointment for depression, anxiety and ADHD.  He states that things are better or around.  He is able to wake up in the morning and goes to work.  His niece split was her boyfriend, and has moved back in.  Although there is an occasional tension between [place], things are going well most of the time.  His brothers do does not talk with him.  He states that they have a very close nearby, although he has not seen him for the past few years.  He is trying to have family discussion.  He is concerned about his mother with memory loss.  He is considering to have them move over to his place in the future, and Carollee Herter is supported in this.  He feels down at times, although it has been better.  He feels anxious, although he denies panic attacks.  He feels tired  despite him sleeping several hours.  He is unsure if Abilify is causing this.  He has increase in appetite prior to starting Abilify.  He recently injured his hamstring.  He has issues with focus, and has made mistakes due to his inattention.  He has been taking Ritalin regularly.  Although it has been helpful, it wears off quickly.  He denies alcohol use or drug use.    Visit Diagnosis:    ICD-10-CM   1. MDD (major depressive disorder), recurrent episode, moderate (HCC)  F33.1     2. Anxiety  F41.9     3. Long term prescription benzodiazepine use  Z79.899     4. Attention deficit hyperactivity disorder (ADHD), unspecified ADHD type  F90.9     5. High risk medication use  Z79.899       Past Psychiatric History: Please see initial evaluation for full details. I have reviewed the history. No updates at this time.     Past Medical History:  Past Medical History:  Diagnosis Date   CES (cauda equina syndrome) (HCC)    Hypertension     Past Surgical History:  Procedure Laterality Date   LUMBAR LAMINECTOMY/DECOMPRESSION MICRODISCECTOMY Right 09/07/2020   Procedure: LUMBAR LAMINECTOMY/DECOMPRESSION MICRODISCECTOMY LUMBAR FOUR-FIVE;  Surgeon: Bedelia Person, MD;  Location: [facility] OR;  Service: Neurosurgery;  Laterality: Right;    Family Psychiatric History: Please see initial evaluation for full details. I have reviewed the history. No updates  at this time.     Family History: No family history on file.  Social History:  Social History   Socioeconomic History   Marital status: Single    Spouse name: Not on file   Number of children: Not on file   Years of education: Not on file   Highest education level: Not on file  Occupational History   Not on file  Tobacco Use   Smoking status: Never   Smokeless tobacco: Never  Substance and Sexual Activity   Alcohol use: Yes    Comment: Socail    Drug use: No   Sexual activity: Not on file  Other Topics Concern   Not on file   Social History Narrative   Not on file   Social Determinants of Health   Financial Resource Strain: Not on file  Food Insecurity: Not on file  Transportation Needs: Not on file  Physical Activity: Not on file  Stress: Not on file  Social Connections: Not on file    Allergies: No Known Allergies  Metabolic Disorder Labs: No results found for: "HGBA1C", "MPG" No results found for: "PROLACTIN" No results found for: "CHOL", "TRIG", "HDL", "CHOLHDL", "VLDL", "LDLCALC" No results found for: "TSH"  Therapeutic Level Labs: No results found for: "LITHIUM" No results found for: "VALPROATE" No results found for: "CBMZ"  Current Medications: Current Outpatient Medications  Medication Sig Dispense Refill   [START ON 12/15/2022] ARIPiprazole (ABILIFY) 2 MG tablet Take 1 tablet (2 mg total) by mouth at bedtime. 90 tablet 0   Ascorbic Acid (VITAMIN C) 1000 MG tablet 1 tablet     buPROPion (WELLBUTRIN XL) 150 MG 24 hr tablet Take 1 tablet (150 mg total) by mouth daily. Take total of 450 mg daily. Take along with 300 mg tab 90 tablet 1   buPROPion (WELLBUTRIN XL) 300 MG 24 hr tablet Take 1 tablet (300 mg total) by mouth daily. Total of 450 mg daily. Take along with 150 mg tab 90 tablet 1   clonazePAM (KLONOPIN) 1 MG tablet Take 1 tablet (1 mg total) by mouth at bedtime. 30 tablet 1   [START ON 12/16/2022] clonazePAM (KLONOPIN) 1 MG tablet Take 1.5 tablets (1.5 mg total) by mouth at bedtime as needed for anxiety. 45 tablet 1   docusate sodium (COLACE) 100 MG capsule Take 1 capsule (100 mg total) by mouth 2 (two) times daily. (Patient not taking: Reported on 08/08/2022) 10 capsule 0   ibuprofen (ADVIL,MOTRIN) 200 MG tablet Take 200 mg by mouth 3 (three) times daily as needed. (Patient not taking: Reported on 08/08/2022)     methylphenidate (RITALIN) 20 MG tablet Take 1 tablet (20 mg total) by mouth 2 (two) times daily. 60 tablet 0   methylphenidate (RITALIN) 20 MG tablet Take 1 tablet (20 mg total)  by mouth 2 (two) times daily. 60 tablet 0   [START ON 01/10/2023] methylphenidate (RITALIN) 20 MG tablet Take 1 tablet (20 mg total) by mouth 2 (two) times daily. 60 tablet 0   Multiple Vitamin (MULTIVITAMIN ADULT) TABS 1 tablet     sertraline (ZOLOFT) 100 MG tablet Take 2 tablets (200 mg total) by mouth at bedtime. 180 tablet 1   No current facility-administered medications for this visit.     Musculoskeletal: Strength & Muscle Tone:  N/A Gait & Station:  N/A Patient leans: N/A  Psychiatric Specialty Exam: Review of Systems  Psychiatric/Behavioral:  Positive for decreased concentration, dysphoric mood and sleep disturbance. Negative for agitation, behavioral problems, confusion, hallucinations, self-injury  and suicidal ideas. The patient is nervous/anxious. The patient is not hyperactive.   All other systems reviewed and are negative.   There were no vitals taken for this visit.There is no height or weight on file to calculate BMI.  General Appearance: Fairly Groomed  Eye Contact:  Good  Speech:  Clear and Coherent  Volume:  Normal  Mood:   better  Affect:  Appropriate, Congruent, and fatigue  Thought Process:  Coherent  Orientation:  Full (Time, Place, and Person)  Thought Content: Logical   Suicidal Thoughts:  No  Homicidal Thoughts:  No  Memory:  Immediate;   Good  Judgement:  Good  Insight:  Good  Psychomotor Activity:  Normal  Concentration:  Concentration: Good and Attention Span: Good  Recall:  Good  Fund of Knowledge: Good  Language: Good  Akathisia:  No  Handed:  Right  AIMS (if indicated): not done  Assets:  Communication Skills Desire for Improvement  ADL's:  Intact  Cognition: WNL  Sleep:  Fair   Screenings: PHQ2-9    Flowsheet Row Office Visit from 08/08/2022 in [facility] Video Visit from 10/20/2020 in [facility] Video Visit from 07/25/2020 in [facility]  PHQ-2 Total Score 0 0 0      Flowsheet Row Video Visit from 01/03/2021 in [facility] Video Visit from 10/20/2020 in [facility] ED to Hosp-Admission (Discharged) from 09/06/2020 in [facility]  C-SSRS RISK CATEGORY Low Risk No Risk No Risk        Assessment and Plan:  Jon Beck is a 42 y.o. year old male with a history of depression,  ADHD,  hypertension,  hyperlipidemia, hypothyroidism, obesity , who presents for follow up appointment for below.   1. MDD (major depressive disorder), recurrent episode, moderate (HCC) 2. Anxiety Acute stressors include: commute to [facility] every day, mother with memory issues  Other stressors include: health of his parents, conflict with his brother, his niece History:   There has been overall improvement in depressive symptoms and anxiety since restarting clonazepam, and starting Abilify.  He would like to stay on the current medication regimen at this time to see how it goes.  Will continue sertraline, bupropion along with Abilify to target depression.  Noted that he reports fatigue, but that he does not associate this with Abilify.  Will continue to assess.   3. Long term prescription benzodiazepine use -Worsening in depression/anxiety in the context of tapering down clonazepam from 1.5 mg to 1 mg, tapered down from 2 mg in Feb 2024 He had significant worsening in his mood symptoms in the setting of tapering down clonazepam.  Will continue current dose at this time, although will try a slow taper down in the future.    4. Attention deficit hyperactivity disorder (ADHD), unspecified ADHD type -  He reportedly had 3 psychological testing, first at age 43.     He continues to struggle with inattention.  Although he may benefit from switching to Focalin in the future/long acting stimulant, will hold at this time  given that he was doing well prior to him having worsening in his mood.  Will continue Ritalin to target ADHD.   5. High risk medication use He was advised again to obtain UDS    Plan Continue sertraline 200 mg daily Continue bupropion 450 mg daily (150 mg + 300  mg )  Continue Abilify 2 mg at night (EKG HR  98, QTc 426 msec, 06/2022) Continue clonazepam 1.5  mg at night as needed for anxiety  Continue Ritalin 10-20 mg twice a day (he may try 10 mg daily to twice a day) Obtain UDS- [facility] Next appointment: 10/8 at 830  for 30 mins, video - he was seen by PCP, had lab 06/2022 TSH 5.4, fT4 wnl   Past trials of medication: sertraline, Effexor (had "zaps"), Wellbutrin, Adderall, Adderall XR, Vyvanse, Concerta, Strattera (limited benefit),    The patient demonstrates the following risk factors for suicide: Chronic risk factors for suicide include: psychiatric disorder of depression. Acute risk factors for suicide include: N/A. Protective factors for this patient include: positive social support, coping skills and hope for the future. Considering these factors, the overall suicide risk at this point appears to be low. Patient is appropriate for outpatient follow up., who presents for follow up appointment for below.       Collaboration of Care: Collaboration of Care: Other reviewed notes in Epic  Patient/Guardian was advised Release of Information must be obtained prior to any record release in order to collaborate their care with an outside provider. Patient/Guardian was advised if they have not already done so to contact the registration department to sign all necessary forms in order for [place] to release information regarding their care.   Consent: Patient/Guardian gives verbal consent for treatment and assignment of benefits for services provided during this visit. Patient/Guardian expressed understanding and agreed to proceed.    [name], MD 12/11/2022, 10:41 AM

## 2022-12-11 ENCOUNTER — Encounter: Payer: Self-pay | Admitting: Psychiatry

## 2022-12-11 ENCOUNTER — Telehealth: Payer: 59 | Admitting: Psychiatry

## 2022-12-11 DIAGNOSIS — Z79899 Other long term (current) drug therapy: Secondary | ICD-10-CM | POA: Diagnosis not present

## 2022-12-11 DIAGNOSIS — F909 Attention-deficit hyperactivity disorder, unspecified type: Secondary | ICD-10-CM | POA: Diagnosis not present

## 2022-12-11 DIAGNOSIS — F331 Major depressive disorder, recurrent, moderate: Secondary | ICD-10-CM

## 2022-12-11 DIAGNOSIS — F419 Anxiety disorder, unspecified: Secondary | ICD-10-CM | POA: Diagnosis not present

## 2022-12-11 MED ORDER — CLONAZEPAM 1 MG PO TABS: 1.5000 mg | ORAL_TABLET | Freq: Every evening | ORAL | 1 refills | Status: DC | PRN

## 2022-12-11 MED ORDER — ARIPIPRAZOLE 2 MG PO TABS: 2.0000 mg | ORAL_TABLET | Freq: Every day | ORAL | 0 refills | Status: DC

## 2022-12-11 MED ORDER — METHYLPHENIDATE HCL 20 MG PO TABS: 20.0000 mg | ORAL_TABLET | Freq: Two times a day (BID) | ORAL | 0 refills | Status: DC

## 2022-12-11 NOTE — Patient Instructions (Signed)
Continue sertraline 200 mg daily Continue bupropion 450 mg daily (150 mg + 300 mg )  Continue Abilify 2 mg at night  Continue clonazepam 1.5  mg at night as needed for anxiety  Continue Ritalin 10-20 mg twice a day Obtain UDS- [facility] Next appointment: 10/8 at 830

## 2022-12-16 ENCOUNTER — Telehealth: Payer: Self-pay

## 2022-12-16 ENCOUNTER — Encounter (INDEPENDENT_AMBULATORY_CARE_PROVIDER_SITE_OTHER): Payer: 59

## 2022-12-16 DIAGNOSIS — F909 Attention-deficit hyperactivity disorder, unspecified type: Secondary | ICD-10-CM

## 2022-12-16 MED ORDER — DEXMETHYLPHENIDATE HCL ER 15 MG PO CP24: 15.0000 mg | ORAL_CAPSULE | ORAL | 0 refills | Status: DC

## 2022-12-16 NOTE — Telephone Encounter (Signed)
prior Berkley Harvey was approved until 12-15-25

## 2022-12-16 NOTE — Telephone Encounter (Signed)
received fax that a prior auth was needed for the dexmethylphenidate hcl er 15mg 

## 2022-12-16 NOTE — Telephone Encounter (Signed)
I've spent a total time of 8 minutes providing service to this patient-generated inquiry in the MyChart message

## 2022-12-16 NOTE — Telephone Encounter (Signed)
went online to [email or web address] and submitted the prior auth . - pending 

## 2022-12-17 MED ORDER — DEXMETHYLPHENIDATE HCL ER 10 MG PO CP24: 10.0000 mg | ORAL_CAPSULE | ORAL | 0 refills | Status: DC

## 2022-12-17 NOTE — Addendum Note (Signed)
Addended by: [name] on: 12/17/2022 05:22 PM   Modules accepted: Orders

## 2023-01-14 NOTE — Telephone Encounter (Signed)
Called patient as instructed by provider to inform of medication  Focalin he voiced understanding

## 2023-01-21 MED ORDER — DEXMETHYLPHENIDATE HCL ER 10 MG PO CP24: 10.0000 mg | ORAL_CAPSULE | Freq: Two times a day (BID) | ORAL | 0 refills | Status: DC

## 2023-01-21 NOTE — Telephone Encounter (Signed)
Called pharmacy as instructed by provider spoke to [place] she stated that she would get both cancelled

## 2023-01-21 NOTE — Addendum Note (Signed)
Addended by: [name] on: 01/21/2023 12:23 PM   Modules accepted: Orders

## 2023-01-24 NOTE — Progress Notes (Deleted)
BH MD/PA/NP OP Progress Note  01/24/2023 2:59 PM Klaus Casteneda  MRN:  [id number]  Chief Complaint: No chief complaint on file.  HPI: ***  Urine drug screen  Visit Diagnosis: No diagnosis found.  Past Psychiatric History: Please see initial evaluation for full details. I have reviewed the history. No updates at this time.     Past Medical History:  Past Medical History:  Diagnosis Date   CES (cauda equina syndrome) (HCC)    Hypertension     Past Surgical History:  Procedure Laterality Date   LUMBAR LAMINECTOMY/DECOMPRESSION MICRODISCECTOMY Right 09/07/2020   Procedure: LUMBAR LAMINECTOMY/DECOMPRESSION MICRODISCECTOMY LUMBAR FOUR-FIVE;  Surgeon: Bedelia Person, MD;  Location: [facility] OR;  Service: Neurosurgery;  Laterality: Right;    Family Psychiatric History: Please see initial evaluation for full details. I have reviewed the history. No updates at this time.     Family History: No family history on file.  Social History:  Social History   Socioeconomic History   Marital status: Single    Spouse name: Not on file   Number of children: Not on file   Years of education: Not on file   Highest education level: Not on file  Occupational History   Not on file  Tobacco Use   Smoking status: Never   Smokeless tobacco: Never  Substance and Sexual Activity   Alcohol use: Yes    Comment: Socail    Drug use: No   Sexual activity: Not on file  Other Topics Concern   Not on file  Social History Narrative   Not on file   Social Determinants of Health   Financial Resource Strain: Not on file  Food Insecurity: Not on file  Transportation Needs: Not on file  Physical Activity: Not on file  Stress: Not on file  Social Connections: Not on file    Allergies: No Known Allergies  Metabolic Disorder Labs: No results found for: "HGBA1C", "MPG" No results found for: "PROLACTIN" No results found for: "CHOL", "TRIG", "HDL", "CHOLHDL", "VLDL", "LDLCALC" No results found for:  "TSH"  Therapeutic Level Labs: No results found for: "LITHIUM" No results found for: "VALPROATE" No results found for: "CBMZ"  Current Medications: Current Outpatient Medications  Medication Sig Dispense Refill   ARIPiprazole (ABILIFY) 2 MG tablet Take 1 tablet (2 mg total) by mouth at bedtime. 90 tablet 0   Ascorbic Acid (VITAMIN C) 1000 MG tablet 1 tablet     buPROPion (WELLBUTRIN XL) 150 MG 24 hr tablet Take 1 tablet (150 mg total) by mouth daily. Take total of 450 mg daily. Take along with 300 mg tab 90 tablet 1   buPROPion (WELLBUTRIN XL) 300 MG 24 hr tablet Take 1 tablet (300 mg total) by mouth daily. Total of 450 mg daily. Take along with 150 mg tab 90 tablet 1   clonazePAM (KLONOPIN) 1 MG tablet Take 1 tablet (1 mg total) by mouth at bedtime. 30 tablet 1   clonazePAM (KLONOPIN) 1 MG tablet Take 1.5 tablets (1.5 mg total) by mouth at bedtime as needed for anxiety. 45 tablet 1   dexmethylphenidate (FOCALIN XR) 10 MG 24 hr capsule Take 1 capsule (10 mg total) by mouth every morning. 30 capsule 0   dexmethylphenidate (FOCALIN XR) 10 MG 24 hr capsule Take 1 capsule (10 mg total) by mouth 2 (two) times daily. 60 capsule 0   dexmethylphenidate (FOCALIN XR) 15 MG 24 hr capsule Take 1 capsule (15 mg total) by mouth every morning. 30 capsule 0  dexmethylphenidate (FOCALIN XR) 15 MG 24 hr capsule Take 1 capsule (15 mg total) by mouth every morning. 30 capsule 0   docusate sodium (COLACE) 100 MG capsule Take 1 capsule (100 mg total) by mouth 2 (two) times daily. (Patient not taking: Reported on 08/08/2022) 10 capsule 0   ibuprofen (ADVIL,MOTRIN) 200 MG tablet Take 200 mg by mouth 3 (three) times daily as needed. (Patient not taking: Reported on 08/08/2022)     methylphenidate (RITALIN) 20 MG tablet Take 1 tablet (20 mg total) by mouth 2 (two) times daily. 60 tablet 0   methylphenidate (RITALIN) 20 MG tablet Take 1 tablet (20 mg total) by mouth 2 (two) times daily. 60 tablet 0   methylphenidate  (RITALIN) 20 MG tablet Take 1 tablet (20 mg total) by mouth 2 (two) times daily. 60 tablet 0   Multiple Vitamin (MULTIVITAMIN ADULT) TABS 1 tablet     sertraline (ZOLOFT) 100 MG tablet Take 2 tablets (200 mg total) by mouth at bedtime. 180 tablet 1   No current facility-administered medications for this visit.     Musculoskeletal: Strength & Muscle Tone:  N/A Gait & Station:  N/A Patient leans: N/A  Psychiatric Specialty Exam: Review of Systems  There were no vitals taken for this visit.There is no height or weight on file to calculate BMI.  General Appearance: {Appearance:22683}  Eye Contact:  {BHH EYE CONTACT:22684}  Speech:  Clear and Coherent  Volume:  Normal  Mood:  {BHH MOOD:22306}  Affect:  {Affect (PAA):22687}  Thought Process:  Coherent  Orientation:  Full (Time, Place, and Person)  Thought Content: Logical   Suicidal Thoughts:  {ST/HT (PAA):22692}  Homicidal Thoughts:  {ST/HT (PAA):22692}  Memory:  Immediate;   Good  Judgement:  {Judgement (PAA):22694}  Insight:  {Insight (PAA):22695}  Psychomotor Activity:  Normal  Concentration:  Concentration: Good and Attention Span: Good  Recall:  Good  Fund of Knowledge: Good  Language: Good  Akathisia:  No  Handed:  Right  AIMS (if indicated): not done  Assets:  Communication Skills Desire for Improvement  ADL's:  Intact  Cognition: WNL  Sleep:  {BHH GOOD/FAIR/POOR:22877}   Screenings: [employer] Row Office Visit from 08/08/2022 in [facility] Video Visit from 10/20/2020 in [facility] Video Visit from 07/25/2020 in [facility]  PHQ-2 Total Score 0 0 0      Flowsheet Row Video Visit from 01/03/2021 in [facility] Video Visit from 10/20/2020 in [facility] ED to Hosp-Admission (Discharged) from 09/06/2020 in [facility]  C-SSRS RISK CATEGORY Low Risk No Risk No Risk        Assessment and Plan:  Mohab Ashby is a 42 y.o. year old male with a history of depression,  ADHD,  hypertension,  hyperlipidemia, hypothyroidism, obesity , who presents for follow up appointment for below.    1. MDD (major depressive disorder), recurrent episode, moderate (HCC) 2. Anxiety Acute stressors include: commute to [facility] every day, mother with memory issues  Other stressors include: health of his parents, conflict with his brother, his niece History:   There has been overall improvement in depressive symptoms and anxiety since restarting clonazepam, and starting Abilify.  He would like to stay on the current medication regimen at this time to see how it goes.  Will continue sertraline, bupropion along with Abilify to target depression.  Noted that he reports fatigue, but that he does not associate this with Abilify.  Will continue to assess.    3. Long term prescription benzodiazepine use -Worsening in depression/anxiety in the context of tapering down clonazepam from 1.5 mg to 1 mg, tapered down from 2 mg in Feb 2024 He had significant worsening in his mood symptoms in the setting of tapering down clonazepam.  Will continue current dose at this time, although will try a slow taper down in the future.     4. Attention deficit hyperactivity disorder (ADHD), unspecified ADHD type -  He reportedly had 3 psychological testing, first at age 89.     He continues to struggle with inattention.  Although he may benefit from switching to Focalin in the future/long acting stimulant, will hold at this time given that he was doing well prior to him having worsening in his mood.  Will continue Ritalin to target ADHD.    5. High risk medication use He was advised again to obtain UDS    Plan Continue sertraline 200 mg daily Continue bupropion 450 mg daily (150 mg + 300 mg )  Continue Abilify 2  mg at night (EKG HR  98, QTc 426 msec, 06/2022) Continue clonazepam 1.5  mg at night as needed for anxiety  Continue Ritalin 10-20 mg twice a day (he may try 10 mg daily to twice a day) Obtain UDS- [facility] Next appointment: 10/8 at 830  for 30 mins, video - he was seen by PCP, had lab 06/2022 TSH 5.4, fT4 wnl   Past trials of medication: sertraline, Effexor (had "zaps"), Wellbutrin, Adderall, Adderall XR, Vyvanse, Concerta, Strattera (limited benefit),    The patient demonstrates the following risk factors for suicide: Chronic risk factors for suicide include: psychiatric disorder of depression. Acute risk factors for suicide include: N/A. Protective factors for this patient include: positive social support, coping skills and hope for the future. Considering these factors, the overall suicide risk at this point appears to be low. Patient is appropriate for outpatient follow up., who presents for follow up appointment for below.       Collaboration of Care: Collaboration of Care: {BH OP Collaboration of Care:[id number]}  Patient/Guardian was advised Release of Information must be obtained prior to any record release in order to collaborate their care with an outside provider. Patient/Guardian was advised if they have not already done so to contact the registration department to sign all necessary forms in order for [place] to release information regarding their care.   Consent: Patient/Guardian gives verbal consent for treatment and assignment of benefits for services provided during this visit. Patient/Guardian expressed understanding and agreed to proceed.    [name], MD 01/24/2023, 2:59 PM

## 2023-01-28 ENCOUNTER — Telehealth: Payer: 59 | Admitting: Psychiatry

## 2023-02-08 NOTE — Progress Notes (Unsigned)
Virtual Visit via Video Note  I connected with Jon Beck on 02/13/23 at 11:00 AM EDT by a video enabled telemedicine application and verified that I am speaking with the correct person using two identifiers.  Location: Patient: work Provider: office Persons participated in the visit- patient, provider    I discussed the limitations of evaluation and management by telemedicine and the availability of in person appointments. The patient expressed understanding and agreed to proceed.    I discussed the assessment and treatment plan with the patient. The patient was provided an opportunity to ask questions and all were answered. The patient agreed with the plan and demonstrated an understanding of the instructions.   The patient was advised to call back or seek an in-person evaluation if the symptoms worsen or if the condition fails to improve as anticipated.  I provided 30 minutes of non-face-to-face time during this encounter.   [name], MD    [facility] MD/PA/NP OP Progress Note  02/13/2023 11:25 AM Jon Beck  MRN:  [id number]  Chief Complaint:  Chief Complaint  Patient presents with   Follow-up   HPI:  This is a follow-up appointment for depression, anxiety and ADHD.  He states that he has been doing good.  His niece has moved back to [place] to stay with her mother.  Although nothing has happened, she sometimes does this kind of behavior, and he will likely get back from her when she needs something.  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  Although he still has concern about his mother's memory, she has been doing fine.  He thinks that work has been going well.  The current regimen of Focalin and Ritalin has been working good.  Although he has insomnia, he attributes this to staying up late at night to study for the exam for certification.  He denies change in appetite.  He goes to the gym a few times per week.  He denies feeling  depressed.  He denies SI.  He denies alcohol use or drug use.  He feels comfortable to stay on the current medication regimen.     Visit Diagnosis:    ICD-10-CM   1. MDD (major depressive disorder), recurrent episode, moderate (HCC)  F33.1     2. Anxiety  F41.9 sertraline (ZOLOFT) 100 MG tablet    3. Long term prescription benzodiazepine use  Z79.899     4. Attention deficit hyperactivity disorder (ADHD), unspecified ADHD type [F90.9]  F90.9     5. High risk medication use  Z79.899 Urine Drug Panel 7    6. MDD (major depressive disorder), recurrent, in full remission (HCC)  F33.42 buPROPion (WELLBUTRIN XL) 150 MG 24 hr tablet      Past Psychiatric History: Please see initial evaluation for full details. I have reviewed the history. No updates at this time.     Past Medical History:  Past Medical History:  Diagnosis Date   CES (cauda equina syndrome) (HCC)    Hypertension     Past Surgical History:  Procedure Laterality Date   LUMBAR LAMINECTOMY/DECOMPRESSION MICRODISCECTOMY Right 09/07/2020   Procedure: LUMBAR LAMINECTOMY/DECOMPRESSION MICRODISCECTOMY LUMBAR FOUR-FIVE;  Surgeon: Bedelia Person, MD;  Location: [facility] OR;  Service: Neurosurgery;  Laterality: Right;    Family Psychiatric History: Please see initial evaluation for full details. I have reviewed the history. No updates at this time.     Family History: No family history on file.  Social History:  Social History   Socioeconomic History   Marital status: Single    Spouse name: Not on file   Number of children: Not on file   Years of education: Not on file   Highest education level: Not on file  Occupational History   Not on file  Tobacco Use   Smoking status: Never   Smokeless tobacco: Never  Substance and Sexual Activity   Alcohol use: Yes    Comment: Socail    Drug use: No   Sexual activity: Not on file  Other Topics Concern   Not on file  Social History Narrative   Not on file   Social  Determinants of Health   Financial Resource Strain: Not on file  Food Insecurity: Not on file  Transportation Needs: Not on file  Physical Activity: Not on file  Stress: Not on file  Social Connections: Not on file    Allergies: No Known Allergies  Metabolic Disorder Labs: No results found for: "HGBA1C", "MPG" No results found for: "PROLACTIN" No results found for: "CHOL", "TRIG", "HDL", "CHOLHDL", "VLDL", "LDLCALC" No results found for: "TSH"  Therapeutic Level Labs: No results found for: "LITHIUM" No results found for: "VALPROATE" No results found for: "CBMZ"  Current Medications: Current Outpatient Medications  Medication Sig Dispense Refill   [START ON 02/20/2023] dexmethylphenidate (FOCALIN XR) 20 MG 24 hr capsule Take 1 capsule (20 mg total) by mouth every morning. 30 capsule 0   [START ON 03/22/2023] dexmethylphenidate (FOCALIN XR) 20 MG 24 hr capsule Take 1 capsule (20 mg total) by mouth every morning. 30 capsule 0   [START ON 02/22/2023] methylphenidate (RITALIN) 20 MG tablet Take 1 tablet (20 mg total) by mouth daily at 12 noon. 30 tablet 0   [START ON 03/24/2023] methylphenidate (RITALIN) 20 MG tablet Take 1 tablet (20 mg total) by mouth daily at 12 noon. 30 tablet 0   ARIPiprazole (ABILIFY) 2 MG tablet Take 1 tablet (2 mg total) by mouth at bedtime. 90 tablet 0   Ascorbic Acid (VITAMIN C) 1000 MG tablet 1 tablet     [START ON 04/05/2023] buPROPion (WELLBUTRIN XL) 150 MG 24 hr tablet Take 1 tablet (150 mg total) by mouth daily. Take total of 450 mg daily. Take along with 300 mg tab 90 tablet 1   buPROPion (WELLBUTRIN XL) 300 MG 24 hr tablet Take 1 tablet (300 mg total) by mouth daily. Total of 450 mg daily. Take along with 150 mg tab 90 tablet 1   clonazePAM (KLONOPIN) 1 MG tablet Take 1 tablet (1 mg total) by mouth at bedtime. 30 tablet 1   [START ON 02/14/2023] clonazePAM (KLONOPIN) 1 MG tablet Take 1.5 tablets (1.5 mg total) by mouth at bedtime as needed for anxiety. 45  tablet 1   dexmethylphenidate (FOCALIN XR) 10 MG 24 hr capsule Take 1 capsule (10 mg total) by mouth 2 (two) times daily. 60 capsule 0   docusate sodium (COLACE) 100 MG capsule Take 1 capsule (100 mg total) by mouth 2 (two) times daily. (Patient not taking: Reported on 08/08/2022) 10 capsule 0   ibuprofen (ADVIL,MOTRIN) 200 MG tablet Take 200 mg by mouth 3 (three) times daily as needed. (Patient not taking: Reported on 08/08/2022)     methylphenidate (RITALIN) 20 MG tablet Take 1 tablet (20 mg total) by mouth 2 (two) times daily. 60 tablet 0   methylphenidate (RITALIN) 20 MG tablet Take 1 tablet (20 mg total) by mouth 2 (two) times daily. 60 tablet 0  methylphenidate (RITALIN) 20 MG tablet Take 1 tablet (20 mg total) by mouth 2 (two) times daily. 60 tablet 0   Multiple Vitamin (MULTIVITAMIN ADULT) TABS 1 tablet     [START ON 04/04/2023] sertraline (ZOLOFT) 100 MG tablet Take 2 tablets (200 mg total) by mouth at bedtime. 180 tablet 1   No current facility-administered medications for this visit.     Musculoskeletal: Strength & Muscle Tone:  N/A Gait & Station:  N/A Patient leans: N/A  Psychiatric Specialty Exam: Review of Systems  Psychiatric/Behavioral:  Positive for sleep disturbance. Negative for agitation, behavioral problems, confusion, decreased concentration, dysphoric mood, hallucinations, self-injury and suicidal ideas. The patient is not nervous/anxious and is not hyperactive.   All other systems reviewed and are negative.   There were no vitals taken for this visit.There is no height or weight on file to calculate BMI.  General Appearance: Well Groomed  Eye Contact:  Good  Speech:  Clear and Coherent  Volume:  Normal  Mood:   good  Affect:  Appropriate, Congruent, and Full Range  Thought Process:  Coherent  Orientation:  Full (Time, Place, and Person)  Thought Content: Logical   Suicidal Thoughts:  No  Homicidal Thoughts:  No  Memory:  Immediate;   Good  Judgement:  Good   Insight:  Good  Psychomotor Activity:  Normal  Concentration:  Concentration: Good and Attention Span: Good  Recall:  Good  Fund of Knowledge: Good  Language: Good  Akathisia:  No  Handed:  Right  AIMS (if indicated): not done  Assets:  Communication Skills Desire for Improvement  ADL's:  Intact  Cognition: WNL  Sleep:  Fair   Screenings: PHQ2-9    Flowsheet Row Office Visit from 08/08/2022 in [facility] Video Visit from 10/20/2020 in [facility] Video Visit from 07/25/2020 in [facility]  PHQ-2 Total Score 0 0 0      Flowsheet Row Video Visit from 01/03/2021 in [facility] Video Visit from 10/20/2020 in [facility] ED to Hosp-Admission (Discharged) from 09/06/2020 in [facility]  C-SSRS RISK CATEGORY Low Risk No Risk No Risk        Assessment and Plan:  Jon Beck is a 42 y.o. year old male with a history of depression,  ADHD,  hypertension,  hyperlipidemia, hypothyroidism, obesity , who presents for follow up appointment for below.   1. MDD (major depressive disorder), recurrent episode, in partial remission (HCC) 2. Anxiety Acute stressors include: commute to [facility] every day, mother with memory issues  Other stressors include: health of his parents, conflict with his brother, his niece History:   There has been steady improvement in depressive symptoms and anxiety since restarting clonazepam and starting Abilify.  Will continue current dose of sertraline, bupropion along with Abilify to target depression.   3. Long term prescription benzodiazepine use -Worsening in depression/anxiety in the context of tapering down clonazepam from 1.5 mg to 1 mg, tapered down from 2 mg in Feb 2024 Although he is willing to try tapering off clonazepam,  he had an episode of relapse in his mood symptoms.  Will continue current dose at this time to target anxiety.   4. Attention deficit hyperactivity disorder (ADHD), unspecified ADHD type [F90.9] -  He reportedly had 3 psychological testing, first at age 72.    Improving.  Will continue current dose of  Focalin along with Ritalin to target ADHD.   5. High risk medication use He was advised again to obtain UDS.   Plan Continue sertraline 200 mg daily Continue bupropion 450 mg daily (150 mg + 300 mg )  Continue Abilify 2 mg at night (EKG HR  98, QTc 426 msec, 06/2022) Continue clonazepam 1.5  mg at night as needed for anxiety  Continue Focalin XR 20 mg daily Continue Ritalin 20 mg in PM Obtain UDS- [facility] Next appointment: 12/20 at 9 30  for 30 mins, video - he was seen by PCP, had lab 06/2022 TSH 5.4, fT4 wnl   Past trials of medication: sertraline, Effexor (had "zaps"), Wellbutrin, Adderall, Adderall XR, Vyvanse, Concerta, Strattera (limited benefit),    The patient demonstrates the following risk factors for suicide: Chronic risk factors for suicide include: psychiatric disorder of depression. Acute risk factors for suicide include: N/A. Protective factors for this patient include: positive social support, coping skills and hope for the future. Considering these factors, the overall suicide risk at this point appears to be low. Patient is appropriate for outpatient follow up., who presents for follow up appointment for below.       Collaboration of Care: Collaboration of Care: Other reviewed notes in Epic  Patient/Guardian was advised Release of Information must be obtained prior to any record release in order to collaborate their care with an outside provider. Patient/Guardian was advised if they have not already done so to contact the registration department to sign all necessary forms in order for [place] to release information regarding their care.   Consent: Patient/Guardian gives verbal  consent for treatment and assignment of benefits for services provided during this visit. Patient/Guardian expressed understanding and agreed to proceed.   I have utilized the [place] Controlled Substances Reporting System (PMP AWARxE) to confirm adherence regarding the patient's medication. My review reveals appropriate prescription fills.    [name], MD 02/13/2023, 11:25 AM

## 2023-02-13 ENCOUNTER — Encounter: Payer: Self-pay | Admitting: Psychiatry

## 2023-02-13 ENCOUNTER — Telehealth (INDEPENDENT_AMBULATORY_CARE_PROVIDER_SITE_OTHER): Payer: 59 | Admitting: Psychiatry

## 2023-02-13 DIAGNOSIS — F909 Attention-deficit hyperactivity disorder, unspecified type: Secondary | ICD-10-CM | POA: Diagnosis not present

## 2023-02-13 DIAGNOSIS — F331 Major depressive disorder, recurrent, moderate: Secondary | ICD-10-CM

## 2023-02-13 DIAGNOSIS — F419 Anxiety disorder, unspecified: Secondary | ICD-10-CM

## 2023-02-13 DIAGNOSIS — F3342 Major depressive disorder, recurrent, in full remission: Secondary | ICD-10-CM

## 2023-02-13 DIAGNOSIS — Z79899 Other long term (current) drug therapy: Secondary | ICD-10-CM

## 2023-02-13 MED ORDER — CLONAZEPAM 1 MG PO TABS: 1.5000 mg | ORAL_TABLET | Freq: Every evening | ORAL | 1 refills | Status: DC | PRN

## 2023-02-13 MED ORDER — DEXMETHYLPHENIDATE HCL ER 20 MG PO CP24: 20.0000 mg | ORAL_CAPSULE | ORAL | 0 refills | Status: DC

## 2023-02-13 MED ORDER — SERTRALINE HCL 100 MG PO TABS: 200.0000 mg | ORAL_TABLET | Freq: Every day | ORAL | 1 refills | Status: DC

## 2023-02-13 MED ORDER — BUPROPION HCL ER (XL) 300 MG PO TB24: 300.0000 mg | ORAL_TABLET | Freq: Every day | ORAL | 1 refills | Status: DC

## 2023-02-13 MED ORDER — METHYLPHENIDATE HCL 20 MG PO TABS: 20.0000 mg | ORAL_TABLET | Freq: Every day | ORAL | 0 refills | Status: DC

## 2023-02-13 MED ORDER — BUPROPION HCL ER (XL) 150 MG PO TB24: 150.0000 mg | ORAL_TABLET | Freq: Every day | ORAL | 1 refills | Status: DC

## 2023-02-13 NOTE — Patient Instructions (Addendum)
Continue sertraline 200 mg daily Continue bupropion 450 mg daily (150 mg + 300 mg )  Continue Abilify 2 mg at night  Continue clonazepam 1.5  mg at night as needed for anxiety  Continue Focalin XR 20 mg daily Continue  Ritalin 20 mg in PM Obtain UDS- [facility] Next appointment: 12/20 at 9 30

## 2023-03-05 MED ORDER — DEXMETHYLPHENIDATE HCL ER 10 MG PO CP24: 10.0000 mg | ORAL_CAPSULE | Freq: Two times a day (BID) | ORAL | 0 refills | Status: DC

## 2023-03-05 NOTE — Addendum Note (Signed)
Addended by: [name] on: 03/05/2023 09:22 AM   Modules accepted: Orders

## 2023-03-05 NOTE — Telephone Encounter (Signed)
Could you contact [facility] in [place], and cancel Focalin XR 20 mg orders, and XR 10 mg order, which was ordered this morning to be started on Dec 7th.

## 2023-03-05 NOTE — Addendum Note (Signed)
Addended by: [name] on: 03/05/2023 04:59 PM   Modules accepted: Orders

## 2023-03-05 NOTE — Telephone Encounter (Signed)
Called pharmacy as instructed by provider spoke to [place] she stated that the patient did not pick up the Focalin on 11/7 probably due to the cost $128 and the generic is on backorder

## 2023-03-05 NOTE — Telephone Encounter (Signed)
Could you contact the pharmacy to verify whether it is true that Focalin was not dispensed on 11/7? Thanks.

## 2023-03-06 NOTE — Telephone Encounter (Signed)
Called [facility] as instructed by provider spoke to [facility] and she stated that she would cancel the Focalin XR 20 and XR 10

## 2023-03-09 ENCOUNTER — Other Ambulatory Visit: Payer: Self-pay | Admitting: Psychiatry

## 2023-03-10 ENCOUNTER — Other Ambulatory Visit: Payer: Self-pay | Admitting: Psychiatry

## 2023-03-10 MED ORDER — DEXMETHYLPHENIDATE HCL ER 10 MG PO CP24: 10.0000 mg | ORAL_CAPSULE | Freq: Two times a day (BID) | ORAL | 0 refills | Status: DC

## 2023-04-06 NOTE — Progress Notes (Deleted)
[facility] MD/PA/NP OP Progress Note  04/06/2023 3:08 PM Jon Beck  MRN:  [id number]  Chief Complaint: No chief complaint on file.  HPI: ***  Prescription issues  Visit Diagnosis: No diagnosis found.  Past Psychiatric History: Please see initial evaluation for full details. I have reviewed the history. No updates at this time.     Past Medical History:  Past Medical History:  Diagnosis Date   CES (cauda equina syndrome) (HCC)    Hypertension     Past Surgical History:  Procedure Laterality Date   LUMBAR LAMINECTOMY/DECOMPRESSION MICRODISCECTOMY Right 09/07/2020   Procedure: LUMBAR LAMINECTOMY/DECOMPRESSION MICRODISCECTOMY LUMBAR FOUR-FIVE;  Surgeon: Bedelia Person, MD;  Location: [facility] OR;  Service: Neurosurgery;  Laterality: Right;    Family Psychiatric History: Please see initial evaluation for full details. I have reviewed the history. No updates at this time.     Family History: No family history on file.  Social History:  Social History   Socioeconomic History   Marital status: Single    Spouse name: Not on file   Number of children: Not on file   Years of education: Not on file   Highest education level: Not on file  Occupational History   Not on file  Tobacco Use   Smoking status: Never   Smokeless tobacco: Never  Substance and Sexual Activity   Alcohol use: Yes    Comment: Socail    Drug use: No   Sexual activity: Not on file  Other Topics Concern   Not on file  Social History Narrative   Not on file   Social Drivers of Health   Financial Resource Strain: Not on file  Food Insecurity: Not on file  Transportation Needs: Not on file  Physical Activity: Not on file  Stress: Not on file  Social Connections: Not on file    Allergies: No Known Allergies  Metabolic Disorder Labs: No results found for: "HGBA1C", "MPG" No results found for: "PROLACTIN" No results found for: "CHOL", "TRIG", "HDL", "CHOLHDL", "VLDL", "LDLCALC" No results found for:  "TSH"  Therapeutic Level Labs: No results found for: "LITHIUM" No results found for: "VALPROATE" No results found for: "CBMZ"  Current Medications: Current Outpatient Medications  Medication Sig Dispense Refill   ARIPiprazole (ABILIFY) 2 MG tablet Take 1 tablet (2 mg total) by mouth at bedtime. 90 tablet 0   Ascorbic Acid (VITAMIN C) 1000 MG tablet 1 tablet     buPROPion (WELLBUTRIN XL) 150 MG 24 hr tablet Take 1 tablet (150 mg total) by mouth daily. Take total of 450 mg daily. Take along with 300 mg tab 90 tablet 1   buPROPion (WELLBUTRIN XL) 300 MG 24 hr tablet Take 1 tablet (300 mg total) by mouth daily. Total of 450 mg daily. Take along with 150 mg tab 90 tablet 1   clonazePAM (KLONOPIN) 1 MG tablet Take 1 tablet (1 mg total) by mouth at bedtime. 30 tablet 1   clonazePAM (KLONOPIN) 1 MG tablet Take 1.5 tablets (1.5 mg total) by mouth at bedtime as needed for anxiety. 45 tablet 1   dexmethylphenidate (FOCALIN XR) 10 MG 24 hr capsule Take 1 capsule (10 mg total) by mouth 2 (two) times daily. 60 capsule 0   [START ON 04/09/2023] dexmethylphenidate (FOCALIN XR) 10 MG 24 hr capsule Take 1 capsule (10 mg total) by mouth 2 (two) times daily. 60 capsule 0   docusate sodium (COLACE) 100 MG capsule Take 1 capsule (100 mg total) by mouth 2 (two) times daily. (Patient  not taking: Reported on 08/08/2022) 10 capsule 0   ibuprofen (ADVIL,MOTRIN) 200 MG tablet Take 200 mg by mouth 3 (three) times daily as needed. (Patient not taking: Reported on 08/08/2022)     methylphenidate (RITALIN) 20 MG tablet Take 1 tablet (20 mg total) by mouth 2 (two) times daily. 60 tablet 0   methylphenidate (RITALIN) 20 MG tablet Take 1 tablet (20 mg total) by mouth 2 (two) times daily. 60 tablet 0   methylphenidate (RITALIN) 20 MG tablet Take 1 tablet (20 mg total) by mouth 2 (two) times daily. 60 tablet 0   methylphenidate (RITALIN) 20 MG tablet Take 1 tablet (20 mg total) by mouth daily at 12 noon. 30 tablet 0    methylphenidate (RITALIN) 20 MG tablet Take 1 tablet (20 mg total) by mouth daily at 12 noon. 30 tablet 0   Multiple Vitamin (MULTIVITAMIN ADULT) TABS 1 tablet     sertraline (ZOLOFT) 100 MG tablet Take 2 tablets (200 mg total) by mouth at bedtime. 180 tablet 1   No current facility-administered medications for this visit.     Musculoskeletal: Strength & Muscle Tone:  N/A Gait & Station:  N/A Patient leans: N/A  Psychiatric Specialty Exam: Review of Systems  There were no vitals taken for this visit.There is no height or weight on file to calculate BMI.  General Appearance: {Appearance:22683}  Eye Contact:  {BHH EYE CONTACT:22684}  Speech:  Clear and Coherent  Volume:  Normal  Mood:  {BHH MOOD:22306}  Affect:  {Affect (PAA):22687}  Thought Process:  Coherent  Orientation:  Full (Time, Place, and Person)  Thought Content: Logical   Suicidal Thoughts:  {ST/HT (PAA):22692}  Homicidal Thoughts:  {ST/HT (PAA):22692}  Memory:  Immediate;   Good  Judgement:  {Judgement (PAA):22694}  Insight:  {Insight (PAA):22695}  Psychomotor Activity:  Normal  Concentration:  Concentration: Good and Attention Span: Good  Recall:  Good  Fund of Knowledge: Good  Language: Good  Akathisia:  No  Handed:  Right  AIMS (if indicated): not done  Assets:  Communication Skills Desire for Improvement  ADL's:  Intact  Cognition: WNL  Sleep:  {BHH GOOD/FAIR/POOR:22877}   Screenings: [employer] Row Office Visit from 08/08/2022 in [facility] Video Visit from 10/20/2020 in [facility] Video Visit from 07/25/2020 in [facility]  PHQ-2 Total Score 0 0 0      Flowsheet Row Video Visit from 01/03/2021 in [facility] Video Visit from 10/20/2020 in [facility] ED to Hosp-Admission (Discharged) from  09/06/2020 in [facility]  C-SSRS RISK CATEGORY Low Risk No Risk No Risk        Assessment and Plan:  Jon Beck is a 42 y.o. year old male with a history of depression,  ADHD,  hypertension,  hyperlipidemia, hypothyroidism, obesity , who presents for follow up appointment for below.    1. MDD (major depressive disorder), recurrent episode, in partial remission (HCC) 2. Anxiety Acute stressors include: commute to [facility] every day, mother with memory issues  Other stressors include: health of his parents, conflict with his brother, his niece History:   There has been steady improvement in depressive symptoms and anxiety since restarting clonazepam and starting Abilify.  Will continue current dose of sertraline, bupropion along with Abilify to target depression.    3. Long term prescription benzodiazepine use -Worsening in depression/anxiety  in the context of tapering down clonazepam from 1.5 mg to 1 mg, tapered down from 2 mg in Feb 2024 Although he is willing to try tapering off clonazepam, he had an episode of relapse in his mood symptoms.  Will continue current dose at this time to target anxiety.    4. Attention deficit hyperactivity disorder (ADHD), unspecified ADHD type [F90.9] -  He reportedly had 3 psychological testing, first at age 46.    Improving.  Will continue current dose of Focalin along with Ritalin to target ADHD.    5. High risk medication use He was advised again to obtain UDS.    Plan Continue sertraline 200 mg daily Continue bupropion 450 mg daily (150 mg + 300 mg )  Continue Abilify 2 mg at night (EKG HR  98, QTc 426 msec, 06/2022) Continue clonazepam 1.5  mg at night as needed for anxiety  Continue Focalin XR 20 mg daily Continue Ritalin 20 mg in PM Obtain UDS- [facility] Next appointment: 12/20 at 9 30  for 30 mins, video - he was seen by PCP, had lab 06/2022 TSH 5.4, fT4 wnl   Past trials of medication: sertraline,  Effexor (had "zaps"), Wellbutrin, Adderall, Adderall XR, Vyvanse, Concerta, Strattera (limited benefit),    The patient demonstrates the following risk factors for suicide: Chronic risk factors for suicide include: psychiatric disorder of depression. Acute risk factors for suicide include: N/A. Protective factors for this patient include: positive social support, coping skills and hope for the future. Considering these factors, the overall suicide risk at this point appears to be low. Patient is appropriate for outpatient follow up., who presents for follow up appointment for below.     Collaboration of Care: Collaboration of Care: {BH OP Collaboration of Care:21014065}  Patient/Guardian was advised Release of Information must be obtained prior to any record release in order to collaborate their care with an outside provider. Patient/Guardian was advised if they have not already done so to contact the registration department to sign all necessary forms in order for [place] to release information regarding their care.   Consent: Patient/Guardian gives verbal consent for treatment and assignment of benefits for services provided during this visit. Patient/Guardian expressed understanding and agreed to proceed.    [name], MD 04/06/2023, 3:08 PM

## 2023-04-11 ENCOUNTER — Telehealth: Payer: 59 | Admitting: Psychiatry

## 2023-04-21 ENCOUNTER — Telehealth: Payer: Self-pay

## 2023-04-21 ENCOUNTER — Other Ambulatory Visit: Payer: Self-pay | Admitting: Psychiatry

## 2023-04-21 MED ORDER — CLONAZEPAM 1 MG PO TABS: 1.5000 mg | ORAL_TABLET | Freq: Every evening | ORAL | 0 refills | Status: DC | PRN

## 2023-04-21 NOTE — Telephone Encounter (Signed)
The medication has been sent to that pharmacy for a one-month supply. I believe he may need another refill before the next visit. Please let me know which pharmacy I should send it to.

## 2023-04-21 NOTE — Telephone Encounter (Signed)
pt left a message that he needs a refill on the klonopin. pt was last seen on 10-24 next appt 2-5, pt is out of town and needs rx sent to [facility]

## 2023-04-21 NOTE — Telephone Encounter (Signed)
pt was notified [facility] [place] ,[place]  pharmacy has been updated in the system

## 2023-04-21 NOTE — Telephone Encounter (Signed)
Please let him know that while I'll send in the refill, I'm unable to prescribe clonazepam, which is controlled substances across state lines. I'd recommend finding a nearby pharmacy in [place] or visiting the nearest urgent care to ensure he doesn't run out of medication.

## 2023-04-22 NOTE — Telephone Encounter (Signed)
called pt to make sure he was able to pick up his medication without any issues. pt states that he picked up this morning and thank you for sending rx.

## 2023-05-05 ENCOUNTER — Other Ambulatory Visit: Payer: Self-pay | Admitting: Psychiatry

## 2023-05-05 MED ORDER — METHYLPHENIDATE HCL 20 MG PO TABS: 20.0000 mg | ORAL_TABLET | Freq: Every day | ORAL | 0 refills | Status: DC

## 2023-05-23 NOTE — Progress Notes (Signed)
 Virtual Visit via Video Note  I connected with Jon Beck on 05/28/23 at  2:30 PM EST by a video enabled telemedicine application and verified that I am speaking with the correct person using two identifiers.  Location: Patient: home Provider: office Persons participated in the visit- patient, provider    I discussed the limitations of evaluation and management by telemedicine and the availability of in person appointments. The patient expressed understanding and agreed to proceed.     I discussed the assessment and treatment plan with the patient. The patient was provided an opportunity to ask questions and all were answered. The patient agreed with the plan and demonstrated an understanding of the instructions.   The patient was advised to call back or seek an in-person evaluation if the symptoms worsen or if the condition fails to improve as anticipated.   Katheren Sleet, MD    [facility] MD/PA/NP OP Progress Note  05/28/2023 3:06 PM Jon Beck  MRN:  [id number]  Chief Complaint:  Chief Complaint  Patient presents with   Follow-up   HPI:  This is a follow-up appointment for depression, anxiety and ADHD.  He states that he ran out of clonazepam  last Friday for a few days.  He had significant anxiety during that time.  He has been feeling better after he is back on this.  His parents have been doing good.  He has not heard from his niece.  He believes he has done as much as he could do about this.  He reports good relationship with his wife.  He continues to go to gym regularly.  He reports slight worsening in procrastination.  He has this pattern of him doing very well, and then struggle while on the same medication regmien. He denies change in appetite.  He denies SI.  He reports initial and middle insomnia for the past month without any trigger, although he used to be sleeping very well.  He denies concern otherwise, and feels comfortable to stay on the current medication regimen.    Substance use  Tobacco Alcohol Other substances/  Current  Occasional drink Denies,   Past  Occasional drink denies  Past Treatment         Wt Readings from Last 3 Encounters:  08/08/22 279 lb 9.6 oz (126.8 kg)  09/06/20 260 lb (117.9 kg)  08/26/20 235 lb (106.6 kg)     Visit Diagnosis:    ICD-10-CM   1. MDD (major depressive disorder), recurrent, in partial remission (HCC)  F33.41     2. Anxiety  F41.9     3. Long term prescription benzodiazepine use  Z79.899     4. Attention deficit hyperactivity disorder (ADHD), unspecified ADHD type [F90.9]  F90.9     5. High risk medication use  Z79.899       Past Psychiatric History: Please see initial evaluation for full details. I have reviewed the history. No updates at this time.     Past Medical History:  Past Medical History:  Diagnosis Date   CES (cauda equina syndrome) (HCC)    Hypertension     Past Surgical History:  Procedure Laterality Date   LUMBAR LAMINECTOMY/DECOMPRESSION MICRODISCECTOMY Right 09/07/2020   Procedure: LUMBAR LAMINECTOMY/DECOMPRESSION MICRODISCECTOMY LUMBAR FOUR-FIVE;  Surgeon: Debby Dorn MATSU, MD;  Location: [facility] OR;  Service: Neurosurgery;  Laterality: Right;    Family Psychiatric History: Please see initial evaluation for full details. I have reviewed the history. No updates at this time.     Family History:  No family history on file.  Social History:  Social History   Socioeconomic History   Marital status: Single    Spouse name: Not on file   Number of children: Not on file   Years of education: Not on file   Highest education level: Not on file  Occupational History   Not on file  Tobacco Use   Smoking status: Never   Smokeless tobacco: Never  Substance and Sexual Activity   Alcohol use: Yes    Comment: Socail    Drug use: No   Sexual activity: Not on file  Other Topics Concern   Not on file  Social History Narrative   Not on file   Social Drivers of Health    Financial Resource Strain: Not on file  Food Insecurity: Not on file  Transportation Needs: Not on file  Physical Activity: Not on file  Stress: Not on file  Social Connections: Not on file    Allergies: No Known Allergies  Metabolic Disorder Labs: No results found for: HGBA1C, MPG No results found for: PROLACTIN No results found for: CHOL, TRIG, HDL, CHOLHDL, VLDL, LDLCALC No results found for: TSH  Therapeutic Level Labs: No results found for: LITHIUM No results found for: VALPROATE No results found for: CBMZ  Current Medications: Current Outpatient Medications  Medication Sig Dispense Refill   ARIPiprazole  (ABILIFY ) 2 MG tablet Take 1 tablet (2 mg total) by mouth at bedtime. 90 tablet 0   Ascorbic Acid (VITAMIN C) 1000 MG tablet 1 tablet     buPROPion  (WELLBUTRIN  XL) 150 MG 24 hr tablet Take 1 tablet (150 mg total) by mouth daily. Take total of 450 mg daily. Take along with 300 mg tab 90 tablet 1   buPROPion  (WELLBUTRIN  XL) 300 MG 24 hr tablet Take 1 tablet (300 mg total) by mouth daily. Total of 450 mg daily. Take along with 150 mg tab 90 tablet 1   clonazePAM  (KLONOPIN ) 1 MG tablet Take 1 tablet (1 mg total) by mouth at bedtime. 30 tablet 1   [START ON 06/25/2023] clonazePAM  (KLONOPIN ) 1 MG tablet Take 1.5 tablets (1.5 mg total) by mouth at bedtime as needed for anxiety. 45 tablet 0   dexmethylphenidate  (FOCALIN  XR) 10 MG 24 hr capsule Take 1 capsule (10 mg total) by mouth 2 (two) times daily. 60 capsule 0   [START ON 06/27/2023] dexmethylphenidate  (FOCALIN  XR) 10 MG 24 hr capsule Take 1 capsule (10 mg total) by mouth 2 (two) times daily. 60 capsule 0   docusate sodium  (COLACE) 100 MG capsule Take 1 capsule (100 mg total) by mouth 2 (two) times daily. (Patient not taking: Reported on 08/08/2022) 10 capsule 0   ibuprofen (ADVIL,MOTRIN) 200 MG tablet Take 200 mg by mouth 3 (three) times daily as needed. (Patient not taking: Reported on 08/08/2022)      methylphenidate  (RITALIN ) 20 MG tablet Take 1 tablet (20 mg total) by mouth 2 (two) times daily. 60 tablet 0   methylphenidate  (RITALIN ) 20 MG tablet Take 1 tablet (20 mg total) by mouth 2 (two) times daily. 60 tablet 0   methylphenidate  (RITALIN ) 20 MG tablet Take 1 tablet (20 mg total) by mouth 2 (two) times daily. 60 tablet 0   [START ON 06/04/2023] methylphenidate  (RITALIN ) 20 MG tablet Take 1 tablet (20 mg total) by mouth daily at 12 noon. 30 tablet 0   [START ON 07/04/2023] methylphenidate  (RITALIN ) 20 MG tablet Take 1 tablet (20 mg total) by mouth daily at 12 noon. 30  tablet 0   Multiple Vitamin (MULTIVITAMIN ADULT) TABS 1 tablet     sertraline  (ZOLOFT ) 100 MG tablet Take 2 tablets (200 mg total) by mouth at bedtime. 180 tablet 1   No current facility-administered medications for this visit.     Musculoskeletal: Strength & Muscle Tone:  N/A Gait & Station:  N/A Patient leans: N/A  Psychiatric Specialty Exam: Review of Systems  Psychiatric/Behavioral:  Positive for decreased concentration and sleep disturbance. Negative for agitation, behavioral problems, confusion, dysphoric mood, hallucinations, self-injury and suicidal ideas. The patient is nervous/anxious. The patient is not hyperactive.   All other systems reviewed and are negative.   There were no vitals taken for this visit.There is no height or weight on file to calculate BMI.  General Appearance: Well Groomed  Eye Contact:  Good  Speech:  Clear and Coherent  Volume:  Normal  Mood:   good  Affect:  Appropriate, Congruent, and Full Range  Thought Process:  Coherent  Orientation:  Full (Time, Place, and Person)  Thought Content: Logical   Suicidal Thoughts:  No  Homicidal Thoughts:  No  Memory:  Immediate;   Good  Judgement:  Good  Insight:  Good  Psychomotor Activity:  Normal  Concentration:  Concentration: Good and Attention Span: Good  Recall:  Good  Fund of Knowledge: Good  Language: Good  Akathisia:  No   Handed:  Right  AIMS (if indicated): not done  Assets:  Communication Skills Desire for Improvement  ADL's:  Intact  Cognition: WNL  Sleep:  Poor   Screenings: PHQ2-9    Flowsheet Row Office Visit from 08/08/2022 in [facility] Video Visit from 10/20/2020 in [facility] Video Visit from 07/25/2020 in [facility]  PHQ-2 Total Score 0 0 0      Flowsheet Row Video Visit from 01/03/2021 in [facility] Video Visit from 10/20/2020 in [facility] ED to Hosp-Admission (Discharged) from 09/06/2020 in [facility]  C-SSRS RISK CATEGORY Low Risk No Risk No Risk        Assessment and Plan:  Jon Beck is a 43 y.o. year old male with a history of depression,  ADHD,  hypertension,  hyperlipidemia, hypothyroidism, obesity , who presents for follow up appointment for below.   1. MDD (major depressive disorder), recurrent episode, in partial remission (HCC) 2. Anxiety Acute stressors include: commute to [facility] every day, mother with memory issues  Other stressors include: health of his parents, conflict with his brother, his niece History:   Although he had significant worsening in anxiety in the context of running out of clonazepam , he reports stable mood otherwise.  Will continue current dose of sertraline  to target depression, anxiety along with bupropion  and Abilify  to target depression.   3. Long term prescription benzodiazepine use -Worsening in depression/anxiety in the context of tapering down clonazepam  from 1.5 mg to 1 mg, tapered down from 2 mg in Feb 2024 Although he is willing to taper down clonazepam , he experiences relapse in his mood symptoms.  Will continue current dose of clonazepam  at this time to target anxiety.   4. Attention deficit  hyperactivity disorder (ADHD), unspecified ADHD type [F90.9] -  He reportedly had 3 psychological testing, first at age 25.    Although he reports slight worsening, it has been overall manageable.  Will continue current medication regimen of Focalin  along with  Ritalin  to target ADHD.   5. High risk medication use He was advised again to obtain UDS.   # Insomnia He reports significant worsening in the past month and initial and middle insomnia.  Provided psychoeducation to improve sleep hygiene.  Will continue to monitor and intervene as needed.     Last checked  EKG HR 98, QTc426 msec 06/2022  Lipid panels  Annual visit next week  HbA1c  Annual visit next week    Plan Continue sertraline  200 mg daily Continue bupropion  450 mg daily (150 mg + 300 mg )  Continue Abilify  2 mg at night (EKG HR  98, QTc 426 msec, 06/2022) Continue clonazepam  1.5  mg at night as needed for anxiety  Continue Focalin  XR 10 mg twice a day Continue Ritalin  20 mg in PM Obtain UDS- [facility] Next appointment: 4/2 at 11 am for 30 mins, video. Will consider IP visit for the following visit.  - he was seen by PCP, had lab 06/2022 TSH 5.4, fT4 wnl   Past trials of medication: sertraline , Effexor (had zaps), Wellbutrin , Adderall, Adderall XR, Vyvanse , Concerta , Strattera (limited benefit),    The patient demonstrates the following risk factors for suicide: Chronic risk factors for suicide include: psychiatric disorder of depression. Acute risk factors for suicide include: N/A. Protective factors for this patient include: positive social support, coping skills and hope for the future. Considering these factors, the overall suicide risk at this point appears to be low. Patient is appropriate for outpatient follow up., who presents for follow up appointment for below.   I have utilized the [place] Controlled Substances Reporting System (PMP AWARxE) to confirm adherence regarding the patient's medication. My review reveals  appropriate prescription fills.     Collaboration of Care: Collaboration of Care: Other reviewed notes in Epic  Patient/Guardian was advised Release of Information must be obtained prior to any record release in order to collaborate their care with an outside provider. Patient/Guardian was advised if they have not already done so to contact the registration department to sign all necessary forms in order for us  to release information regarding their care.   Consent: Patient/Guardian gives verbal consent for treatment and assignment of benefits for services provided during this visit. Patient/Guardian expressed understanding and agreed to proceed.    Katheren Sleet, MD 05/28/2023, 3:06 PM

## 2023-05-26 ENCOUNTER — Other Ambulatory Visit: Payer: Self-pay | Admitting: Psychiatry

## 2023-05-26 ENCOUNTER — Telehealth: Payer: Self-pay

## 2023-05-26 MED ORDER — CLONAZEPAM 1 MG PO TABS: 1.5000 mg | ORAL_TABLET | Freq: Every evening | ORAL | 0 refills | Status: DC | PRN

## 2023-05-26 NOTE — Telephone Encounter (Signed)
 Left message that rx was sent to the pharmacy

## 2023-05-26 NOTE — Telephone Encounter (Signed)
pt called states he needs a refill on the clonazepam. he was last seen on 10-24 next appt 2-5

## 2023-05-26 NOTE — Telephone Encounter (Signed)
 ordered

## 2023-05-28 ENCOUNTER — Telehealth: Payer: 59 | Admitting: Psychiatry

## 2023-05-28 ENCOUNTER — Encounter: Payer: Self-pay | Admitting: Psychiatry

## 2023-05-28 DIAGNOSIS — F419 Anxiety disorder, unspecified: Secondary | ICD-10-CM

## 2023-05-28 DIAGNOSIS — F3341 Major depressive disorder, recurrent, in partial remission: Secondary | ICD-10-CM | POA: Diagnosis not present

## 2023-05-28 DIAGNOSIS — F909 Attention-deficit hyperactivity disorder, unspecified type: Secondary | ICD-10-CM

## 2023-05-28 DIAGNOSIS — Z79899 Other long term (current) drug therapy: Secondary | ICD-10-CM

## 2023-05-28 MED ORDER — DEXMETHYLPHENIDATE HCL ER 10 MG PO CP24: 10.0000 mg | ORAL_CAPSULE | Freq: Two times a day (BID) | ORAL | 0 refills | Status: DC

## 2023-05-28 MED ORDER — METHYLPHENIDATE HCL 20 MG PO TABS: 20.0000 mg | ORAL_TABLET | Freq: Every day | ORAL | 0 refills | Status: DC

## 2023-05-28 MED ORDER — CLONAZEPAM 1 MG PO TABS: 1.5000 mg | ORAL_TABLET | Freq: Every evening | ORAL | 0 refills | Status: DC | PRN

## 2023-05-28 NOTE — Patient Instructions (Signed)
 Continue sertraline  200 mg daily Continue bupropion  450 mg daily (150 mg + 300 mg )  Continue Abilify  2 mg at night  Continue clonazepam  1.5  mg at night as needed for anxiety  Continue Focalin  XR 10 mg twice a day Continue Ritalin  20 mg in PM Obtain UDS- [facility] Next appointment: 4/2 at 11 am

## 2023-06-05 ENCOUNTER — Other Ambulatory Visit: Payer: Self-pay | Admitting: Psychiatry

## 2023-07-22 LAB — URINE DRUG PANEL 7
Amphetamines, Urine: NEGATIVE ng/mL
Barbiturate Quant, Ur: NEGATIVE ng/mL
Benzodiazepine Quant, Ur: NEGATIVE ng/mL

## 2023-07-23 ENCOUNTER — Encounter: Payer: Self-pay | Admitting: Psychiatry

## 2023-07-23 ENCOUNTER — Telehealth (INDEPENDENT_AMBULATORY_CARE_PROVIDER_SITE_OTHER): Payer: 59 | Admitting: Psychiatry

## 2023-07-23 DIAGNOSIS — Z79899 Other long term (current) drug therapy: Secondary | ICD-10-CM

## 2023-07-23 DIAGNOSIS — F909 Attention-deficit hyperactivity disorder, unspecified type: Secondary | ICD-10-CM

## 2023-07-23 DIAGNOSIS — F419 Anxiety disorder, unspecified: Secondary | ICD-10-CM | POA: Diagnosis not present

## 2023-07-23 DIAGNOSIS — F3341 Major depressive disorder, recurrent, in partial remission: Secondary | ICD-10-CM | POA: Diagnosis not present

## 2023-07-23 MED ORDER — DEXMETHYLPHENIDATE HCL ER 10 MG PO CP24: 10.0000 mg | ORAL_CAPSULE | Freq: Two times a day (BID) | ORAL | 0 refills | Status: DC

## 2023-07-23 MED ORDER — CLONAZEPAM 1 MG PO TABS: 1.5000 mg | ORAL_TABLET | Freq: Every evening | ORAL | 1 refills | Status: DC | PRN

## 2023-07-23 MED ORDER — METHYLPHENIDATE HCL 20 MG PO TABS: 20.0000 mg | ORAL_TABLET | Freq: Every day | ORAL | 0 refills | Status: DC

## 2023-07-23 MED ORDER — BUPROPION HCL ER (XL) 300 MG PO TB24: 300.0000 mg | ORAL_TABLET | Freq: Every day | ORAL | 1 refills | Status: DC

## 2023-07-23 MED ORDER — ARIPIPRAZOLE 2 MG PO TABS: 2.0000 mg | ORAL_TABLET | Freq: Every day | ORAL | 0 refills | Status: DC

## 2023-07-23 NOTE — Patient Instructions (Signed)
 Continue sertraline 200 mg daily Continue bupropion 450 mg daily (150 mg + 300 mg )  Continue Abilify 2 mg at night  Continue clonazepam 1.5  mg at night as needed for anxiety  Continue Focalin XR 10 mg twice a day Continue Ritalin 20 mg in PM Next appointment: 5/28 at 11 30

## 2023-07-23 NOTE — Progress Notes (Addendum)
 Virtual Visit via Video Note  I connected with Jon Beck on 07/23/23 at 11:00 AM EDT by a video enabled telemedicine application and verified that I am speaking with the correct person using two identifiers.  Location: Patient: home Provider: office Persons participated in the visit- patient, provider    I discussed the limitations of evaluation and management by telemedicine and the availability of in person appointments. The patient expressed understanding and agreed to proceed.    I discussed the assessment and treatment plan with the patient. The patient was provided an opportunity to ask questions and all were answered. The patient agreed with the plan and demonstrated an understanding of the instructions.   The patient was advised to call back or seek an in-person evaluation if the symptoms worsen or if the condition fails to improve as anticipated.   [name], MD    [facility] MD/PA/NP OP Progress Note  07/23/2023 11:31 AM Jon Beck  MRN:  [id number]  Chief Complaint:  Chief Complaint  Patient presents with   Follow-up   HPI:  This is a follow-up appointment for depression, anxiety, ADHD.  He states that he has been doing good.  He is doing well at work.  Although it has been busy, he is on top of things.  He will be visiting his parents in [place].  His parent has stage IV kidney disease, and is trying to get a second opinion.  He has not heard back from his niece.  He believes she will eventually contact them.  He states that his mood has been good.  When he is asked about the positive urine drug screening, he states that he has been taking CBD every other night for insomnia.  He is struggling with initial insomnia.  He is unsure why he has this.  He needs to sleep for 8 hours, and has been doing well since taking this.  Although he feels uneasy about the change, he states that he will stop if that is advised.  He would like to hold on CBT I referral.  He does not  want to have additional medication either.  He denies feeling depressed. He is working on diet, and is active. He denies SI.  He agrees with the plan as outlined below.   He was seen by his [facility]. He was started on levothyroxine.   Substance use   Tobacco Alcohol Other substances/  Current   Occasional drink CBD/THC in the past month for insomnia  Past   Occasional drink denies  Past Treatment             Visit Diagnosis:    ICD-10-CM   1. MDD (major depressive disorder), recurrent, in partial remission (HCC)  F33.41     2. Anxiety  F41.9     3. Attention deficit hyperactivity disorder (ADHD), unspecified ADHD type [F90.9]  F90.9     4. Long term prescription benzodiazepine use  Z79.899     5. High risk medication use  Z79.899       Past Psychiatric History: Please see initial evaluation for full details. I have reviewed the history. No updates at this time.     Past Medical History:  Past Medical History:  Diagnosis Date   CES (cauda equina syndrome) (HCC)    Hypertension     Past Surgical History:  Procedure Laterality Date   LUMBAR LAMINECTOMY/DECOMPRESSION MICRODISCECTOMY Right 09/07/2020   Procedure: LUMBAR LAMINECTOMY/DECOMPRESSION MICRODISCECTOMY LUMBAR FOUR-FIVE;  Surgeon: Bedelia Person, MD;  Location: [facility] OR;  Service: Neurosurgery;  Laterality: Right;    Family Psychiatric History: Please see initial evaluation for full details. I have reviewed the history. No updates at this time.     Family History: No family history on file.  Social History:  Social History   Socioeconomic History   Marital status: Single    Spouse name: Not on file   Number of children: Not on file   Years of education: Not on file   Highest education level: Not on file  Occupational History   Not on file  Tobacco Use   Smoking status: Never   Smokeless tobacco: Never  Substance and Sexual Activity   Alcohol use: Yes    Comment: Socail    Drug use: No   Sexual  activity: Not on file  Other Topics Concern   Not on file  Social History Narrative   Not on file   Social Drivers of Health   Financial Resource Strain: Not on file  Food Insecurity: Not on file  Transportation Needs: Not on file  Physical Activity: Not on file  Stress: Not on file  Social Connections: Not on file    Allergies: No Known Allergies  Metabolic Disorder Labs: No results found for: "HGBA1C", "MPG" No results found for: "PROLACTIN" No results found for: "CHOL", "TRIG", "HDL", "CHOLHDL", "VLDL", "LDLCALC" No results found for: "TSH"  Therapeutic Level Labs: No results found for: "LITHIUM" No results found for: "VALPROATE" No results found for: "CBMZ"  Current Medications: Current Outpatient Medications  Medication Sig Dispense Refill   levothyroxine (SYNTHROID) 25 MCG tablet Take 25 mcg by mouth daily before breakfast.     [START ON 09/11/2023] ARIPiprazole (ABILIFY) 2 MG tablet Take 1 tablet (2 mg total) by mouth at bedtime. 90 tablet 0   Ascorbic Acid (VITAMIN C) 1000 MG tablet 1 tablet     buPROPion (WELLBUTRIN XL) 150 MG 24 hr tablet Take 1 tablet (150 mg total) by mouth daily. Take total of 450 mg daily. Take along with 300 mg tab 90 tablet 1   [START ON 08/12/2023] buPROPion (WELLBUTRIN XL) 300 MG 24 hr tablet Take 1 tablet (300 mg total) by mouth daily. Total of 450 mg daily. Take along with 150 mg tab 90 tablet 1   clonazePAM (KLONOPIN) 1 MG tablet Take 1 tablet (1 mg total) by mouth at bedtime. 30 tablet 1   [START ON 07/25/2023] clonazePAM (KLONOPIN) 1 MG tablet Take 1.5 tablets (1.5 mg total) by mouth at bedtime as needed for anxiety. 45 tablet 1   [START ON 07/27/2023] dexmethylphenidate (FOCALIN XR) 10 MG 24 hr capsule Take 1 capsule (10 mg total) by mouth 2 (two) times daily. 60 capsule 0   [START ON 08/26/2023] dexmethylphenidate (FOCALIN XR) 10 MG 24 hr capsule Take 1 capsule (10 mg total) by mouth 2 (two) times daily. 60 capsule 0   docusate sodium  (COLACE) 100 MG capsule Take 1 capsule (100 mg total) by mouth 2 (two) times daily. (Patient not taking: Reported on 08/08/2022) 10 capsule 0   ibuprofen (ADVIL,MOTRIN) 200 MG tablet Take 200 mg by mouth 3 (three) times daily as needed. (Patient not taking: Reported on 08/08/2022)     methylphenidate (RITALIN) 20 MG tablet Take 1 tablet (20 mg total) by mouth 2 (two) times daily. 60 tablet 0   methylphenidate (RITALIN) 20 MG tablet Take 1 tablet (20 mg total) by mouth 2 (two) times daily. 60 tablet 0   methylphenidate (RITALIN) 20 MG  tablet Take 1 tablet (20 mg total) by mouth 2 (two) times daily. 60 tablet 0   methylphenidate (RITALIN) 20 MG tablet Take 1 tablet (20 mg total) by mouth daily at 12 noon. 30 tablet 0   [START ON 08/22/2023] methylphenidate (RITALIN) 20 MG tablet Take 1 tablet (20 mg total) by mouth daily at 12 noon. 30 tablet 0   Multiple Vitamin (MULTIVITAMIN ADULT) TABS 1 tablet     sertraline (ZOLOFT) 100 MG tablet Take 2 tablets (200 mg total) by mouth at bedtime. 180 tablet 1   No current facility-administered medications for this visit.     Musculoskeletal: Strength & Muscle Tone:  N/A Gait & Station:  N/A Patient leans: N/A  Psychiatric Specialty Exam: Review of Systems  Psychiatric/Behavioral:  Positive for sleep disturbance. Negative for agitation, behavioral problems, confusion, decreased concentration, dysphoric mood, hallucinations, self-injury and suicidal ideas. The patient is not nervous/anxious and is not hyperactive.   All other systems reviewed and are negative.   There were no vitals taken for this visit.There is no height or weight on file to calculate BMI.  General Appearance: Well Groomed  Eye Contact:  Good  Speech:  Clear and Coherent  Volume:  Normal  Mood:   good  Affect:  Appropriate, Congruent, and Full Range  Thought Process:  Coherent  Orientation:  Full (Time, Place, and Person)  Thought Content: Logical   Suicidal Thoughts:  No  Homicidal  Thoughts:  No  Memory:  Immediate;   Good  Judgement:  Good  Insight:  Good  Psychomotor Activity:  Normal  Concentration:  Concentration: Good and Attention Span: Good  Recall:  Good  Fund of Knowledge: Good  Language: Good  Akathisia:  No  Handed:  Right  AIMS (if indicated): not done  Assets:  Communication Skills Desire for Improvement  ADL's:  Intact  Cognition: WNL  Sleep:  Good   Screenings: PHQ2-9    Flowsheet Row Office Visit from 08/08/2022 in [facility] Video Visit from 10/20/2020 in [facility] Video Visit from 07/25/2020 in [facility]  PHQ-2 Total Score 0 0 0      Flowsheet Row Video Visit from 01/03/2021 in [facility] Video Visit from 10/20/2020 in [facility] ED to Hosp-Admission (Discharged) from 09/06/2020 in [facility]  C-SSRS RISK CATEGORY Low Risk No Risk No Risk        Assessment and Plan:  Haynes Giannotti is a 43 y.o. year old male with a history of depression,  ADHD,  hypertension,  hyperlipidemia, hypothyroidism, obesity , who presents for follow up appointment for below.    1. MDD (major depressive disorder), recurrent, in partial remission (HCC) 2. Anxiety Acute stressors include: commute to [facility] every day, mother with memory issues  Other stressors include: health of his parents, conflict with his brother, his niece History:   He denies any significant mood symptoms since the last visit.  Will continue sertraline to target depression and anxiety.  Will continue bupropion and Abilify as adjunctive treatment for depression.   3. Attention deficit hyperactivity disorder (ADHD), unspecified ADHD type [F90.9] -  He reportedly had 3 psychological testing, first at age 27.    He denies any concern about attention, and  reports good benefit from the current medication regimen.  Will continue Focalin and Ritalin to target ADHD.   4. Long term  prescription benzodiazepine use -Worsening in depression/anxiety in the context of tapering down clonazepam from 1.5 mg to 1 mg, tapered down from 2 mg in Feb 2024 Although he is willing to taper down clonazepam, he had a relapse in his mood symptoms when tapering down.  Will continue current dose of clonazepam to target anxiety for now.   5. High risk medication use # CBD/THC use He has been using CBD/THC for insomnia.  Provided psychoeducation about his positive negative impact on his attention and mood.  He is willing to be abstinent from this.    # Insomnia Unstable, and he has started to use CBD/THC.  He continues to experience initial insomnia.  He is not interested in CBT I, or pharmacological treatment at this time.  Will continue to assess.        Last checked  EKG HR 98, QTc426 msec 06/2022  Lipid panels   Annual visit next week  HbA1c   Annual visit next week  He will send lab results to the office   Plan Continue sertraline 200 mg daily Continue bupropion 450 mg daily (150 mg + 300 mg )  Continue Abilify 2 mg at night  Continue clonazepam 1.5  mg at night as needed for anxiety  Continue Focalin XR 10 mg twice a day Continue Ritalin 20 mg in PM Next appointment: 5/28 at 11 30. Will consider IP visit for the following visit.  - he had PCP visit in 2025 - vitamin, testosterone   Past trials of medication: sertraline, Effexor (had "zaps"), Wellbutrin, Adderall, Adderall XR, Vyvanse, Concerta, Strattera (limited benefit),    The patient demonstrates the following risk factors for suicide: Chronic risk factors for suicide include: psychiatric disorder of depression. Acute risk factors for suicide include: N/A. Protective factors for this patient include: positive social support, coping skills and hope for the future. Considering these factors, the overall  suicide risk at this point appears to be low. Patient is appropriate for outpatient follow up., who presents for follow up appointment for below.   Collaboration of Care: Collaboration of Care: Other reviewed notes in Epic  Patient/Guardian was advised Release of Information must be obtained prior to any record release in order to collaborate their care with an outside provider. Patient/Guardian was advised if they have not already done so to contact the registration department to sign all necessary forms in order for [place] to release information regarding their care.   Consent: Patient/Guardian gives verbal consent for treatment and assignment of benefits for services provided during this visit. Patient/Guardian expressed understanding and agreed to proceed.    [name], MD 07/23/2023, 11:31 AM

## 2023-09-11 NOTE — Progress Notes (Unsigned)
 Virtual Visit via Video Note  I connected with Jon Beck on 09/17/23 at 11:30 AM EDT by a video enabled telemedicine application and verified that I am speaking with the correct person using two identifiers.  Location: Patient: home Provider: office Persons participated in the visit- patient, provider    I discussed the limitations of evaluation and management by telemedicine and the availability of in person appointments. The patient expressed understanding and agreed to proceed.    I discussed the assessment and treatment plan with the patient. The patient was provided an opportunity to ask questions and all were answered. The patient agreed with the plan and demonstrated an understanding of the instructions.   The patient was advised to call back or seek an in-person evaluation if the symptoms worsen or if the condition fails to improve as anticipated.   Todd Fossa, MD    [facility] MD/PA/NP OP Progress Note  09/17/2023 12:16 PM [name]  MRN:  [id number]  Chief Complaint:  Chief Complaint  Patient presents with   Follow-up   HPI:  This is a follow-up appointment for depression, anxiety, ADHD.  He states that he had 1 panic attacks since the last visit.  There was a meeting with his boss's boss.  Although he prepared for it, he could not talk.  He handled it very badly.  He excused himself and could not come back.  Although he did not feel happy about the impression he has made, there is nothing he can do.  Although he ruminated on this for a while, it has been improving.  He thinks his mood has been good otherwise.  His mother has been doing well.  They found out that she was on medication, which affected her kidney function.  He reports good relationship with Cathleen Coach.  He enjoyed going [place] a lake.  He has not been sleeping well, and takes Z quil at times.  The only thing he can think about is reducing the dose of clonazepam .  However, he is willing to hold uptitration of  clonazepam  due to concern of dependence.  He denies concern about attention, and believes the medication has been working well.  He denies SI.  He agrees with the plans as outlined..  - Vitals: Wt: 283, Wt change: -10 lbs, Ht: 70, BMI: 40.6, Temp: 97.2, Pulse sitting: 74, BP sitting: 124/81. Weight 277 at home this morning.07/2023   Substance use   Tobacco Alcohol Other substances/  Current   Occasional drink CBD/THC, last use in April. Denies caffeine intake  Past   Occasional drink denies  Past Treatment            Wt Readings from Last 3 Encounters:  08/08/22 279 lb 9.6 oz (126.8 kg)  09/06/20 260 lb (117.9 kg)  08/26/20 235 lb (106.6 kg)     Visit Diagnosis:    ICD-10-CM   1. MDD (major depressive disorder), recurrent, in partial remission (HCC)  F33.41 buPROPion  (WELLBUTRIN  XL) 150 MG 24 hr tablet    2. Anxiety  F41.9     3. Attention deficit hyperactivity disorder (ADHD), unspecified ADHD type [F90.9]  F90.9     4. Long term prescription benzodiazepine use  Z79.899     5. Insomnia, unspecified type  G47.00 Ambulatory referral to Neurology      Past Psychiatric History: Please see initial evaluation for full details. I have reviewed the history. No updates at this time.     Past Medical History:  Past Medical History:  Diagnosis Date   CES (cauda equina syndrome) (HCC)    Hypertension     Past Surgical History:  Procedure Laterality Date   LUMBAR LAMINECTOMY/DECOMPRESSION MICRODISCECTOMY Right 09/07/2020   Procedure: LUMBAR LAMINECTOMY/DECOMPRESSION MICRODISCECTOMY LUMBAR FOUR-FIVE;  Surgeon: Van Gelinas, MD;  Location: [facility] OR;  Service: Neurosurgery;  Laterality: Right;    Family Psychiatric History: Please see initial evaluation for full details. I have reviewed the history. No updates at this time.     Family History: No family history on file.  Social History:  Social History   Socioeconomic History   Marital status: Single    Spouse name: Not  on file   Number of children: Not on file   Years of education: Not on file   Highest education level: Not on file  Occupational History   Not on file  Tobacco Use   Smoking status: Never   Smokeless tobacco: Never  Substance and Sexual Activity   Alcohol use: Yes    Comment: Socail    Drug use: No   Sexual activity: Not on file  Other Topics Concern   Not on file  Social History Narrative   Not on file   Social Drivers of Health   Financial Resource Strain: Not on file  Food Insecurity: Not on file  Transportation Needs: Not on file  Physical Activity: Not on file  Stress: Not on file  Social Connections: Not on file    Allergies: No Known Allergies  Metabolic Disorder Labs: No results found for: "HGBA1C", "MPG" No results found for: "PROLACTIN" No results found for: "CHOL", "TRIG", "HDL", "CHOLHDL", "VLDL", "LDLCALC" No results found for: "TSH"  Therapeutic Level Labs: No results found for: "LITHIUM" No results found for: "VALPROATE" No results found for: "CBMZ"  Current Medications: Current Outpatient Medications  Medication Sig Dispense Refill   traZODone (DESYREL) 50 MG tablet Take 0.5-1 tablets (25-50 mg total) by mouth at bedtime. 30 tablet 0   ARIPiprazole  (ABILIFY ) 2 MG tablet Take 1 tablet (2 mg total) by mouth at bedtime. 90 tablet 0   Ascorbic Acid (VITAMIN C) 1000 MG tablet 1 tablet     [START ON 10/02/2023] buPROPion  (WELLBUTRIN  XL) 150 MG 24 hr tablet Take 1 tablet (150 mg total) by mouth daily. Take total of 450 mg daily. Take along with 300 mg tab 90 tablet 1   buPROPion  (WELLBUTRIN  XL) 300 MG 24 hr tablet Take 1 tablet (300 mg total) by mouth daily. Total of 450 mg daily. Take along with 150 mg tab 90 tablet 1   clonazePAM  (KLONOPIN ) 1 MG tablet Take 1.5 tablets (1.5 mg total) by mouth at bedtime as needed for anxiety. 45 tablet 1   [START ON 09/23/2023] clonazePAM  (KLONOPIN ) 1 MG tablet Take 1.5 tablets (1.5 mg total) by mouth at bedtime. 45 tablet 0    dexmethylphenidate  (FOCALIN  XR) 10 MG 24 hr capsule Take 1 capsule (10 mg total) by mouth 2 (two) times daily. 60 capsule 0   docusate sodium  (COLACE) 100 MG capsule Take 1 capsule (100 mg total) by mouth 2 (two) times daily. (Patient not taking: Reported on 08/08/2022) 10 capsule 0   ibuprofen (ADVIL,MOTRIN) 200 MG tablet Take 200 mg by mouth 3 (three) times daily as needed. (Patient not taking: Reported on 08/08/2022)     levothyroxine  (SYNTHROID ) 25 MCG tablet Take 25 mcg by mouth daily before breakfast.     methylphenidate  (RITALIN ) 20 MG tablet Take 1 tablet (20 mg total) by mouth daily at 12  noon. 30 tablet 0   [START ON 10/01/2023] methylphenidate  (RITALIN ) 20 MG tablet Take 1 tablet (20 mg total) by mouth every evening. 30 tablet 0   Multiple Vitamin (MULTIVITAMIN ADULT) TABS 1 tablet     sertraline  (ZOLOFT ) 100 MG tablet Take 2 tablets (200 mg total) by mouth at bedtime. 180 tablet 1   No current facility-administered medications for this visit.     Musculoskeletal: Strength & Muscle Tone: N/A Gait & Station: N/A Patient leans: N/A  Psychiatric Specialty Exam: Review of Systems  Psychiatric/Behavioral:  Positive for sleep disturbance. Negative for agitation, behavioral problems, confusion, decreased concentration, dysphoric mood, hallucinations, self-injury and suicidal ideas. The patient is nervous/anxious. The patient is not hyperactive.   All other systems reviewed and are negative.   There were no vitals taken for this visit.There is no height or weight on file to calculate BMI.  General Appearance: Well Groomed  Eye Contact:  Good  Speech:  Clear and Coherent  Volume:  Normal  Mood:  good  Affect:  Appropriate, Congruent, and calm  Thought Process:  Coherent  Orientation:  Full (Time, Place, and Person)  Thought Content: Logical   Suicidal Thoughts:  No  Homicidal Thoughts:  No  Memory:  Immediate;   Good  Judgement:  Good  Insight:  Good  Psychomotor Activity:   Normal  Concentration:  Concentration: Good and Attention Span: Good  Recall:  Good  Fund of Knowledge: Good  Language: Good  Akathisia:  No  Handed:  Right  AIMS (if indicated): not done  Assets:  Communication Skills Desire for Improvement  ADL's:  Intact  Cognition: WNL  Sleep:  Poor   Screenings: PHQ2-9    Flowsheet Row Office Visit from 08/08/2022 in [facility] Video Visit from 10/20/2020 in [facility] Video Visit from 07/25/2020 in [facility]  PHQ-2 Total Score 0 0 0      Flowsheet Row Video Visit from 01/03/2021 in [facility] Video Visit from 10/20/2020 in [facility] ED to Hosp-Admission (Discharged) from 09/06/2020 in [facility]  C-SSRS RISK CATEGORY Low Risk No Risk No Risk        Assessment and Plan:  Jasani Dolney is a 43 y.o. year old male with a history of depression,  ADHD,  hypertension,  hyperlipidemia, hypothyroidism, obesity , who presents for follow up appointment for below.   1. MDD (major depressive disorder), recurrent, in partial remission (HCC) 2. Anxiety Acute stressors include: commute to [facility] every day, mother with memory issues  Other stressors include: health of his parents, conflict with his brother, his niece History:   Although he had 1 panic attack since the last visit, he denies any other mood symptoms otherwise.  Will continue current dose of sertraline  to target depression and anxiety, along with bupropion  and Abilify  adjunctive treatment for depression.   3. Attention deficit hyperactivity disorder (ADHD), unspecified ADHD type [F90.9] -  He reportedly had 3 psychological testing, first at age 36.    He reports good benefit from the current medication regimen.  Will continue Focalin  and later into  target ADHD.   4. Long term prescription benzodiazepine use -Worsening in depression/anxiety in the context of tapering down clonazepam  from 1.5 mg to 1 mg, tapered down from 2 mg in Feb 2024 - UDS positive for cannabinoid, 06/2023 Although he is willing to taper  down clonazepam , he had worsening in his mood symptoms when tried to taper down.  Will continue current dose of clonazepam  at this time to target anxiety.   5. Insomnia, unspecified type Slightly worsening.  He has history of snoring, middle insomnia, fatigue.  Will make a referral for evaluation of sleep apnea.    5. High risk medication use # CBD/THC use He has been abstinent from CBD use since the last visit.  Will continue motivational interview.         Last checked  EKG HR 98, QTc426 msec 06/2022  Lipid panels  Chol 249 H, TG 126, LDL 181 H 07/2023  HbA1c  Glu 98 07/2023     Plan Continue sertraline  200 mg daily Continue bupropion  450 mg daily (150 mg + 300 mg )  Continue Abilify  2 mg at night  Continue clonazepam  1.5  mg at night as needed for anxiety  Continue Focalin  XR 10 mg twice a day Continue Ritalin  20 mg in PM Start trazodone 25-50 mg at night as needed for insomnia Referral for evaluation of sleep apnea Next appointment: 6/24 at 3 30, video.  Will consider IP visit for the following visit. - vitamin, testosterone    Past trials of medication: sertraline , Effexor (had "zaps"), Wellbutrin , Adderall, Adderall XR, Vyvanse , Concerta , Strattera (limited benefit),    The patient demonstrates the following risk factors for suicide: Chronic risk factors for suicide include: psychiatric disorder of depression. Acute risk factors for suicide include: N/A. Protective factors for this patient include: positive social support, coping skills and hope for the future. Considering these factors, the overall suicide risk at this point appears to be low. Patient is appropriate for outpatient follow up., who presents for follow up  appointment for below.     Collaboration of Care: Collaboration of Care: Other reviewed notes in EPic  Patient/Guardian was advised Release of Information must be obtained prior to any record release in order to collaborate their care with an outside provider. Patient/Guardian was advised if they have not already done so to contact the registration department to sign all necessary forms in order for us  to release information regarding their care.   Consent: Patient/Guardian gives verbal consent for treatment and assignment of benefits for services provided during this visit. Patient/Guardian expressed understanding and agreed to proceed.    Todd Fossa, MD 09/17/2023, 12:16 PM

## 2023-09-17 ENCOUNTER — Telehealth: Admitting: Psychiatry

## 2023-09-17 ENCOUNTER — Encounter: Payer: Self-pay | Admitting: Psychiatry

## 2023-09-17 DIAGNOSIS — F419 Anxiety disorder, unspecified: Secondary | ICD-10-CM

## 2023-09-17 DIAGNOSIS — G4700 Insomnia, unspecified: Secondary | ICD-10-CM | POA: Diagnosis not present

## 2023-09-17 DIAGNOSIS — F909 Attention-deficit hyperactivity disorder, unspecified type: Secondary | ICD-10-CM | POA: Diagnosis not present

## 2023-09-17 DIAGNOSIS — F3341 Major depressive disorder, recurrent, in partial remission: Secondary | ICD-10-CM | POA: Diagnosis not present

## 2023-09-17 DIAGNOSIS — Z79899 Other long term (current) drug therapy: Secondary | ICD-10-CM

## 2023-09-17 MED ORDER — BUPROPION HCL ER (XL) 150 MG PO TB24: 150.0000 mg | ORAL_TABLET | Freq: Every day | ORAL | 1 refills | Status: DC

## 2023-09-17 MED ORDER — METHYLPHENIDATE HCL 20 MG PO TABS: 20.0000 mg | ORAL_TABLET | Freq: Every evening | ORAL | 0 refills | Status: DC

## 2023-09-17 MED ORDER — CLONAZEPAM 1 MG PO TABS: 1.5000 mg | ORAL_TABLET | Freq: Every day | ORAL | 0 refills | Status: DC

## 2023-09-17 MED ORDER — TRAZODONE HCL 50 MG PO TABS: 25.0000 mg | ORAL_TABLET | Freq: Every day | ORAL | 0 refills | Status: DC

## 2023-09-17 NOTE — Patient Instructions (Signed)
 Continue sertraline  200 mg daily Continue bupropion  450 mg daily  Continue Abilify  2 mg at night  Continue clonazepam  1.5  mg at night as needed for anxiety  Continue Focalin  XR 10 mg twice a day Continue Ritalin  20 mg in PM Start trazodone 25-50 mg at night as needed for insomnia Referral for evaluation of sleep apnea Next appointment: 6/24 at 3 30

## 2023-10-09 NOTE — Progress Notes (Unsigned)
 Virtual Visit via Video Note  I connected with Jon Beck on 10/14/23 at  4:00 PM EDT by a video enabled telemedicine application and verified that I am speaking with the correct person using two identifiers.  Location: Patient: home Provider: home office Persons participated in the visit- patient, provider    I discussed the limitations of evaluation and management by telemedicine and the availability of in person appointments. The patient expressed understanding and agreed to proceed.  I discussed the assessment and treatment plan with the patient. The patient was provided an opportunity to ask questions and all were answered. The patient agreed with the plan and demonstrated an understanding of the instructions.   The patient was advised to call back or seek an in-person evaluation if the symptoms worsen or if the condition fails to improve as anticipated.  Katheren Sleet, MD    [facility] MD/PA/NP OP Progress Note  10/14/2023 4:47 PM Jon Beck  MRN:  [id number]  Chief Complaint:  Chief Complaint  Patient presents with   Follow-up   HPI:  This is a follow-up appointment for depression, anxiety, ADHD and insomnia.  He states that he has been feeling so uneasy and anxious this week.  Although he was feeling better after the previous visit for a few weeks, it has been worse.  He tends to run through random things in his head.  He cannot think of any triggers.  Although he went to the gathering on last weekend, he felt feeling very uneasy being around the crowd.  He has not feel that way for a while.  Although he finds trazodone  to be helpful for sleep, he does not feel refreshed.  His wife notices him having occasional snoring.  He feels down today due to his anxiety. He feels fatigue.  He denies SI, HI, hallucinations.  He takes a vitamin and testosterone booster for the past 4 months.  Although he stopped all of these supplements for a week, there was no change.  He denies drinking  coffee.  He has been taking medication as prescribed.  He takes Ritalin  only occasionally.  He denies any concern about focus.  Levothyroxine  dose was up titrated a few months ago.  He agrees to reach out to his primary care to recheck his thyroid  level.    Vitals: Wt: 283, Wt change: -10 lbs, Ht: 70, BMI: 40.6, Temp: 97.2, Pulse sitting: 74, BP sitting: 124/81. Weight 277 at home this morning.07/2023  Wt Readings from Last 3 Encounters:  08/08/22 279 lb 9.6 oz (126.8 kg)  09/06/20 260 lb (117.9 kg)  08/26/20 235 lb (106.6 kg)        Substance use   Tobacco Alcohol Other substances/  Current   Occasional drink CBD/THC, last use in April. Denies caffeine intake  Past   Occasional drink denies  Past Treatment           Visit Diagnosis:    ICD-10-CM   1. MDD (major depressive disorder), recurrent, in partial remission (HCC)  F33.41     2. Anxiety  F41.9     3. Attention deficit hyperactivity disorder (ADHD), unspecified ADHD type [F90.9]  F90.9     4. Long term prescription benzodiazepine use  Z79.899     5. Insomnia, unspecified type  G47.00       Past Psychiatric History: Please see initial evaluation for full details. I have reviewed the history. No updates at this time.     Past Medical History:  Past Medical History:  Diagnosis Date   CES (cauda equina syndrome) (HCC)    Hypertension     Past Surgical History:  Procedure Laterality Date   LUMBAR LAMINECTOMY/DECOMPRESSION MICRODISCECTOMY Right 09/07/2020   Procedure: LUMBAR LAMINECTOMY/DECOMPRESSION MICRODISCECTOMY LUMBAR FOUR-FIVE;  Surgeon: Debby Dorn MATSU, MD;  Location: [facility] OR;  Service: Neurosurgery;  Laterality: Right;    Family Psychiatric History: Please see initial evaluation for full details. I have reviewed the history. No updates at this time.     Family History: History reviewed. No pertinent family history.  Social History:  Social History   Socioeconomic History   Marital status: Single     Spouse name: Not on file   Number of children: Not on file   Years of education: Not on file   Highest education level: Not on file  Occupational History   Not on file  Tobacco Use   Smoking status: Never   Smokeless tobacco: Never  Substance and Sexual Activity   Alcohol use: Yes    Comment: Socail    Drug use: No   Sexual activity: Not on file  Other Topics Concern   Not on file  Social History Narrative   Not on file   Social Drivers of Health   Financial Resource Strain: Not on file  Food Insecurity: Not on file  Transportation Needs: Not on file  Physical Activity: Not on file  Stress: Not on file  Social Connections: Not on file    Allergies: No Known Allergies  Metabolic Disorder Labs: No results found for: HGBA1C, MPG No results found for: PROLACTIN No results found for: CHOL, TRIG, HDL, CHOLHDL, VLDL, LDLCALC No results found for: TSH  Therapeutic Level Labs: No results found for: LITHIUM No results found for: VALPROATE No results found for: CBMZ  Current Medications: Current Outpatient Medications  Medication Sig Dispense Refill   [START ON 11/16/2023] dexmethylphenidate  (FOCALIN  XR) 10 MG 24 hr capsule Take 1 capsule (10 mg total) by mouth 2 (two) times daily. 60 capsule 0   ARIPiprazole  (ABILIFY ) 2 MG tablet Take 1 tablet (2 mg total) by mouth at bedtime. 90 tablet 0   Ascorbic Acid (VITAMIN C) 1000 MG tablet 1 tablet     buPROPion  (WELLBUTRIN  XL) 150 MG 24 hr tablet Take 1 tablet (150 mg total) by mouth daily. Take total of 450 mg daily. Take along with 300 mg tab 90 tablet 1   buPROPion  (WELLBUTRIN  XL) 300 MG 24 hr tablet Take 1 tablet (300 mg total) by mouth daily. Total of 450 mg daily. Take along with 150 mg tab 90 tablet 1   clonazePAM  (KLONOPIN ) 1 MG tablet Take 1.5 tablets (1.5 mg total) by mouth at bedtime as needed for anxiety. 45 tablet 1   [START ON 10/23/2023] clonazePAM  (KLONOPIN ) 1 MG tablet Take 1.5 tablets (1.5 mg  total) by mouth at bedtime. 45 tablet 1   [START ON 10/17/2023] dexmethylphenidate  (FOCALIN  XR) 10 MG 24 hr capsule Take 1 capsule (10 mg total) by mouth 2 (two) times daily. 60 capsule 0   docusate sodium  (COLACE) 100 MG capsule Take 1 capsule (100 mg total) by mouth 2 (two) times daily. (Patient not taking: Reported on 08/08/2022) 10 capsule 0   ibuprofen (ADVIL,MOTRIN) 200 MG tablet Take 200 mg by mouth 3 (three) times daily as needed. (Patient not taking: Reported on 08/08/2022)     levothyroxine  (SYNTHROID ) 25 MCG tablet Take 25 mcg by mouth daily before breakfast.     methylphenidate  (RITALIN ) 20 MG tablet Take 1  tablet (20 mg total) by mouth daily at 12 noon. 30 tablet 0   methylphenidate  (RITALIN ) 20 MG tablet Take 1 tablet (20 mg total) by mouth every evening. 30 tablet 0   Multiple Vitamin (MULTIVITAMIN ADULT) TABS 1 tablet     sertraline  (ZOLOFT ) 100 MG tablet Take 2 tablets (200 mg total) by mouth at bedtime. 180 tablet 1   traZODone  (DESYREL ) 50 MG tablet Take 0.5-1 tablets (25-50 mg total) by mouth at bedtime. 30 tablet 0   No current facility-administered medications for this visit.     Musculoskeletal: Strength & Muscle Tone: N/A Gait & Station: N/A Patient leans: N/A  Psychiatric Specialty Exam: Review of Systems  Psychiatric/Behavioral:  Positive for dysphoric mood and sleep disturbance. Negative for agitation, behavioral problems, confusion, decreased concentration, hallucinations, self-injury and suicidal ideas. The patient is nervous/anxious. The patient is not hyperactive.   All other systems reviewed and are negative.   There were no vitals taken for this visit.There is no height or weight on file to calculate BMI.  General Appearance: Well Groomed  Eye Contact:  Good  Speech:  Clear and Coherent  Volume:  Normal  Mood:  Anxious  Affect:  Appropriate, Congruent, and Restricted  Thought Process:  Coherent  Orientation:  Full (Time, Place, and Person)  Thought  Content: Logical   Suicidal Thoughts:  No  Homicidal Thoughts:  No  Memory:  Immediate;   Good  Judgement:  Good  Insight:  Good  Psychomotor Activity:  Normal  Concentration:  Concentration: Good and Attention Span: Good  Recall:  Good  Fund of Knowledge: Good  Language: Good  Akathisia:  No  Handed:  Right  AIMS (if indicated): not done  Assets:  Communication Skills Desire for Improvement  ADL's:  Intact  Cognition: WNL  Sleep:  Fair   Screenings: PHQ2-9    Flowsheet Row Office Visit from 08/08/2022 in [facility] Video Visit from 10/20/2020 in [facility] Video Visit from 07/25/2020 in [facility]  PHQ-2 Total Score 0 0 0   Flowsheet Row Video Visit from 01/03/2021 in [facility] Video Visit from 10/20/2020 in [facility] ED to Hosp-Admission (Discharged) from 09/06/2020 in [facility]  C-SSRS RISK CATEGORY Low Risk No Risk No Risk     Assessment and Plan:  Jon Beck is a 43 y.o. year old male with a history of depression,  ADHD,  hypertension,  hyperlipidemia, hypothyroidism, obesity , who presents for follow up appointment for below.   1. MDD (major depressive disorder), recurrent, in partial remission (HCC) 2. Anxiety Acute stressors include: commute to [facility] every day, mother with memory issues  Other stressors include: health of his parents, conflict with his brother, his niece History:   The exam is notable for tense affect, and he reports significant worsening in anxiety over the past week.  This coincided with uptitration of levothyroxine  dose by his primary care. It is noted that although he has been on medication, including stimulants, bupropion , which can cause worsening in anxiety, he has not experienced this symptoms while no  adjustment in these medication.  He agrees to maintain on the current medication regimen, and check in with his primary care to rule out medical health issues contributing to his mood symptoms.  Will continue current dose of sertraline  to target depression, anxiety, along with bupropion  and Abilify  adjunctive  treatment for depression.   3. Attention deficit hyperactivity disorder (ADHD), unspecified ADHD type [F90.9] -  He reportedly had 3 psychological testing, first at age 68.    He reports good benefit from the current medication regimen.  Will continue Focalin , Ritalin  to target ADHD.   4. Long term prescription benzodiazepine use -Worsening in depression/anxiety in the context of tapering down clonazepam  from 1.5 mg to 1 mg, tapered down from 2 mg in Feb 2024 - UDS positive for cannabinoid, 06/2023 Although he is willing to taper down clonazepam , he had worsening in his mood symptoms when tried to taper down.  Will continue current dose of clonazepam  to target anxiety.   5. Insomnia, unspecified type He reports good benefit from trazodone .  He has history of snoring, middle insomnia and fatigue.  Referral was made for evaluation of sleep apnea at his last visit.  He has not received any contact from the office; will check in about this process.    5. High risk medication use # CBD/THC use He has been abstinent from CBD use since the last visit.  Will continue motivational interview.          Last checked  EKG HR 98, QTc426 msec 06/2022  Lipid panels  Chol 249 H, TG 126, LDL 181 H 07/2023  HbA1c  Glu 98 07/2023      Plan Continue sertraline  200 mg daily Continue bupropion  450 mg daily (150 mg + 300 mg )  Continue Abilify  2 mg at night  Continue clonazepam  1.5  mg at night as needed for anxiety  Continue Focalin  XR 10 mg twice a day Continue Ritalin  20 mg in PM - he declined a refill Continue trazodone  25-50 mg at night as needed for insomnia Referred for evaluation of sleep  apnea Next appointment: 8/6 at 4 30 for 30 mins, video- waitlist for sooner visit - on levothyroxine  75 mcg, uptitrated from 50 mcg - vitamin, testosterone      Past trials of medication: sertraline , Effexor (had zaps), Wellbutrin , Adderall, Adderall XR, Vyvanse , Concerta , Strattera (limited benefit),    The patient demonstrates the following risk factors for suicide: Chronic risk factors for suicide include: psychiatric disorder of depression. Acute risk factors for suicide include: N/A. Protective factors for this patient include: positive social support, coping skills and hope for the future. Considering these factors, the overall suicide risk at this point appears to be low. Patient is appropriate for outpatient follow up., who presents for follow up appointment for below.     Collaboration of Care: Collaboration of Care: Other reviewed notes in Epic  Patient/Guardian was advised Release of Information must be obtained prior to any record release in order to collaborate their care with an outside provider. Patient/Guardian was advised if they have not already done so to contact the registration department to sign all necessary forms in order for us  to release information regarding their care.   Consent: Patient/Guardian gives verbal consent for treatment and assignment of benefits for services provided during this visit. Patient/Guardian expressed understanding and agreed to proceed.    Katheren Sleet, MD 10/14/2023, 4:47 PM

## 2023-10-14 ENCOUNTER — Telehealth (INDEPENDENT_AMBULATORY_CARE_PROVIDER_SITE_OTHER): Admitting: Psychiatry

## 2023-10-14 ENCOUNTER — Other Ambulatory Visit: Payer: Self-pay | Admitting: Psychiatry

## 2023-10-14 ENCOUNTER — Encounter: Payer: Self-pay | Admitting: Psychiatry

## 2023-10-14 DIAGNOSIS — F909 Attention-deficit hyperactivity disorder, unspecified type: Secondary | ICD-10-CM | POA: Diagnosis not present

## 2023-10-14 DIAGNOSIS — Z79899 Other long term (current) drug therapy: Secondary | ICD-10-CM

## 2023-10-14 DIAGNOSIS — F419 Anxiety disorder, unspecified: Secondary | ICD-10-CM | POA: Diagnosis not present

## 2023-10-14 DIAGNOSIS — F3341 Major depressive disorder, recurrent, in partial remission: Secondary | ICD-10-CM

## 2023-10-14 DIAGNOSIS — G4700 Insomnia, unspecified: Secondary | ICD-10-CM

## 2023-10-14 MED ORDER — DEXMETHYLPHENIDATE HCL ER 10 MG PO CP24: 10.0000 mg | ORAL_CAPSULE | Freq: Two times a day (BID) | ORAL | 0 refills | Status: DC

## 2023-10-14 MED ORDER — CLONAZEPAM 1 MG PO TABS: 1.5000 mg | ORAL_TABLET | Freq: Every day | ORAL | 1 refills | Status: AC

## 2023-10-30 ENCOUNTER — Other Ambulatory Visit: Payer: Self-pay | Admitting: Psychiatry

## 2023-10-30 MED ORDER — BUSPIRONE HCL 5 MG PO TABS: 5.0000 mg | ORAL_TABLET | Freq: Two times a day (BID) | ORAL | 0 refills | Status: AC

## 2023-11-22 NOTE — Progress Notes (Deleted)
 [facility] MD/PA/NP OP Progress Note  11/22/2023 4:10 PM Jon Beck  MRN:  [id number]  Chief Complaint: No chief complaint on file.  HPI: ***   Buspar  5 mg twice a day   Visit Diagnosis: No diagnosis found.  Past Psychiatric History: Please see initial evaluation for full details. I have reviewed the history. No updates at this time.     Past Medical History:  Past Medical History:  Diagnosis Date   CES (cauda equina syndrome) (HCC)    Hypertension     Past Surgical History:  Procedure Laterality Date   LUMBAR LAMINECTOMY/DECOMPRESSION MICRODISCECTOMY Right 09/07/2020   Procedure: LUMBAR LAMINECTOMY/DECOMPRESSION MICRODISCECTOMY LUMBAR FOUR-FIVE;  Surgeon: Debby Dorn MATSU, MD;  Location: [facility] OR;  Service: Neurosurgery;  Laterality: Right;    Family Psychiatric History: Please see initial evaluation for full details. I have reviewed the history. No updates at this time.     Family History: No family history on file.  Social History:  Social History   Socioeconomic History   Marital status: Single    Spouse name: Not on file   Number of children: Not on file   Years of education: Not on file   Highest education level: Not on file  Occupational History   Not on file  Tobacco Use   Smoking status: Never   Smokeless tobacco: Never  Substance and Sexual Activity   Alcohol use: Yes    Comment: Socail    Drug use: No   Sexual activity: Not on file  Other Topics Concern   Not on file  Social History Narrative   Not on file   Social Drivers of Health   Financial Resource Strain: Not on file  Food Insecurity: Not on file  Transportation Needs: Not on file  Physical Activity: Not on file  Stress: Not on file  Social Connections: Not on file    Allergies: No Known Allergies  Metabolic Disorder Labs: No results found for: HGBA1C, MPG No results found for: PROLACTIN No results found for: CHOL, TRIG, HDL, CHOLHDL, VLDL, LDLCALC No results found  for: TSH  Therapeutic Level Labs: No results found for: LITHIUM No results found for: VALPROATE No results found for: CBMZ  Current Medications: Current Outpatient Medications  Medication Sig Dispense Refill   ARIPiprazole  (ABILIFY ) 2 MG tablet Take 1 tablet (2 mg total) by mouth at bedtime. 90 tablet 0   Ascorbic Acid (VITAMIN C) 1000 MG tablet 1 tablet     buPROPion  (WELLBUTRIN  XL) 150 MG 24 hr tablet Take 1 tablet (150 mg total) by mouth daily. Take total of 450 mg daily. Take along with 300 mg tab 90 tablet 1   buPROPion  (WELLBUTRIN  XL) 300 MG 24 hr tablet Take 1 tablet (300 mg total) by mouth daily. Total of 450 mg daily. Take along with 150 mg tab 90 tablet 1   busPIRone  (BUSPAR ) 5 MG tablet Take 1 tablet (5 mg total) by mouth 2 (two) times daily. 60 tablet 0   clonazePAM  (KLONOPIN ) 1 MG tablet Take 1.5 tablets (1.5 mg total) by mouth at bedtime as needed for anxiety. 45 tablet 1   clonazePAM  (KLONOPIN ) 1 MG tablet Take 1.5 tablets (1.5 mg total) by mouth at bedtime. 45 tablet 1   dexmethylphenidate  (FOCALIN  XR) 10 MG 24 hr capsule Take 1 capsule (10 mg total) by mouth 2 (two) times daily. 60 capsule 0   dexmethylphenidate  (FOCALIN  XR) 10 MG 24 hr capsule Take 1 capsule (10 mg total) by mouth 2 (two) times  daily. 60 capsule 0   docusate sodium  (COLACE) 100 MG capsule Take 1 capsule (100 mg total) by mouth 2 (two) times daily. (Patient not taking: Reported on 08/08/2022) 10 capsule 0   ibuprofen (ADVIL,MOTRIN) 200 MG tablet Take 200 mg by mouth 3 (three) times daily as needed. (Patient not taking: Reported on 08/08/2022)     levothyroxine  (SYNTHROID ) 25 MCG tablet Take 25 mcg by mouth daily before breakfast.     methylphenidate  (RITALIN ) 20 MG tablet Take 1 tablet (20 mg total) by mouth daily at 12 noon. 30 tablet 0   methylphenidate  (RITALIN ) 20 MG tablet Take 1 tablet (20 mg total) by mouth every evening. 30 tablet 0   Multiple Vitamin (MULTIVITAMIN ADULT) TABS 1 tablet      sertraline  (ZOLOFT ) 100 MG tablet Take 2 tablets (200 mg total) by mouth at bedtime. 180 tablet 1   traZODone  (DESYREL ) 50 MG tablet Take 0.5-1 tablets (25-50 mg total) by mouth at bedtime. 90 tablet 0   No current facility-administered medications for this visit.     Musculoskeletal: Strength & Muscle Tone: N/A Gait & Station: N/A Patient leans: N/A  Psychiatric Specialty Exam: Review of Systems  There were no vitals taken for this visit.There is no height or weight on file to calculate BMI.  General Appearance: {Appearance:22683}  Eye Contact:  {[facility] EYE CONTACT:22684}  Speech:  Clear and Coherent  Volume:  Normal  Mood:  {[facility] MOOD:22306}  Affect:  {Affect (PAA):22687}  Thought Process:  Coherent  Orientation:  Full (Time, Place, and Person)  Thought Content: Logical   Suicidal Thoughts:  {ST/HT (PAA):22692}  Homicidal Thoughts:  {ST/HT (PAA):22692}  Memory:  Immediate;   Good  Judgement:  {Judgement (PAA):22694}  Insight:  {Insight (PAA):22695}  Psychomotor Activity:  Normal  Concentration:  Concentration: Good and Attention Span: Good  Recall:  Good  Fund of Knowledge: Good  Language: Good  Akathisia:  No  Handed:  Right  AIMS (if indicated): not done  Assets:  Communication Skills Desire for Improvement  ADL's:  Intact  Cognition: WNL  Sleep:  {[facility] GOOD/FAIR/POOR:22877}   Screenings: [employer] Row Office Visit from 08/08/2022 in [facility] Video Visit from 10/20/2020 in [facility] Video Visit from 07/25/2020 in [facility]  PHQ-2 Total Score 0 0 0   Flowsheet Row Video Visit from 01/03/2021 in [facility] Video Visit from 10/20/2020 in [facility] ED to Hosp-Admission (Discharged) from 09/06/2020 in [facility]  C-SSRS  RISK CATEGORY Low Risk No Risk No Risk     Assessment and Plan:  Jon Beck is a 43 y.o. year old male with a history of depression,  ADHD,  hypertension,  hyperlipidemia, hypothyroidism, obesity , who presents for follow up appointment for below.    1. MDD (major depressive disorder), recurrent, in partial remission (HCC) 2. Anxiety Acute stressors include: commute to [facility] every day, mother with memory issues  Other stressors include: health of his parents, conflict with his brother, his niece History:   The exam is notable for tense affect, and he reports significant worsening in anxiety over the past week.  This coincided with uptitration of levothyroxine  dose by his primary care. It is noted that although he has been on medication, including stimulants, bupropion , which can cause worsening in anxiety, he has not experienced this symptoms while no  adjustment in these medication.  He agrees to maintain on the current medication regimen, and check in with his primary care to rule out medical health issues contributing to his mood symptoms.  Will continue current dose of sertraline  to target depression, anxiety, along with bupropion  and Abilify  adjunctive treatment for depression.    3. Attention deficit hyperactivity disorder (ADHD), unspecified ADHD type [F90.9] -  He reportedly had 3 psychological testing, first at age 20.    He reports good benefit from the current medication regimen.  Will continue Focalin , Ritalin  to target ADHD.    4. Long term prescription benzodiazepine use -Worsening in depression/anxiety in the context of tapering down clonazepam  from 1.5 mg to 1 mg, tapered down from 2 mg in Feb 2024 - UDS positive for cannabinoid, 06/2023 Although he is willing to taper down clonazepam , he had worsening in his mood symptoms when tried to taper down.  Will continue current dose of clonazepam  to target anxiety.    5. Insomnia, unspecified type He reports good benefit from  trazodone .  He has history of snoring, middle insomnia and fatigue.  Referral was made for evaluation of sleep apnea at his last visit.  He has not received any contact from the office; will check in about this process.    5. High risk medication use # CBD/THC use He has been abstinent from CBD use since the last visit.  Will continue motivational interview.          Last checked  EKG HR 98, QTc426 msec 06/2022  Lipid panels  Chol 249 H, TG 126, LDL 181 H 07/2023  HbA1c  Glu 98 07/2023      Plan Continue sertraline  200 mg daily Continue bupropion  450 mg daily (150 mg + 300 mg )  Continue Abilify  2 mg at night  Continue clonazepam  1.5  mg at night as needed for anxiety  Continue Focalin  XR 10 mg twice a day Continue Ritalin  20 mg in PM - he declined a refill Continue trazodone  25-50 mg at night as needed for insomnia Referred for evaluation of sleep apnea Next appointment: 8/6 at 4 30 for 30 mins, video- waitlist for sooner visit - on levothyroxine  75 mcg, uptitrated from 50 mcg - vitamin, testosterone  - TSH 1.46 10/2023    Past trials of medication: sertraline , Effexor (had zaps), Wellbutrin , Adderall, Adderall XR, Vyvanse , Concerta , Strattera (limited benefit),    The patient demonstrates the following risk factors for suicide: Chronic risk factors for suicide include: psychiatric disorder of depression. Acute risk factors for suicide include: N/A. Protective factors for this patient include: positive social support, coping skills and hope for the future. Considering these factors, the overall suicide risk at this point appears to be low. Patient is appropriate for outpatient follow up., who presents for follow up appointment for below.     Collaboration of Care: Collaboration of Care: {BH OP Collaboration of Care:21014065}  Patient/Guardian was advised Release of Information must be obtained prior to any record release in order to collaborate their care with an outside provider.  Patient/Guardian was advised if they have not already done so to contact the registration department to sign all necessary forms in order for us  to release information regarding their care.   Consent: Patient/Guardian gives verbal consent for treatment and assignment of benefits for services provided during this visit. Patient/Guardian expressed understanding and agreed to proceed.    Katheren Sleet, MD 11/22/2023, 4:10 PM

## 2023-11-24 ENCOUNTER — Telehealth: Payer: Self-pay

## 2023-11-24 ENCOUNTER — Other Ambulatory Visit: Payer: Self-pay | Admitting: Psychiatry

## 2023-11-24 MED ORDER — METHYLPHENIDATE HCL 20 MG PO TABS: 20.0000 mg | ORAL_TABLET | Freq: Every day | ORAL | 0 refills | Status: DC

## 2023-11-24 NOTE — Telephone Encounter (Signed)
 Ordered

## 2023-11-24 NOTE — Telephone Encounter (Signed)
 Medication refill - Patient left a message he is in need of a new Ritalin  20 mg order to be called into his [facility] on [facility], last provided 10/01/23, last seen 10/14/23 and returns next on 11/26/23.

## 2023-11-26 ENCOUNTER — Telehealth: Admitting: Psychiatry

## 2023-12-07 ENCOUNTER — Other Ambulatory Visit: Payer: Self-pay | Admitting: Psychiatry

## 2023-12-10 NOTE — Progress Notes (Unsigned)
 Virtual Visit via Video Note  I connected with Jon Beck on 12/11/23 at  8:30 AM EDT by a video enabled telemedicine application and verified that I am speaking with the correct person using two identifiers.  Location: Patient: home Provider: office Persons participated in the visit- patient, provider    I discussed the limitations of evaluation and management by telemedicine and the availability of in person appointments. The patient expressed understanding and agreed to proceed.    I discussed the assessment and treatment plan with the patient. The patient was provided an opportunity to ask questions and all were answered. The patient agreed with the plan and demonstrated an understanding of the instructions.   The patient was advised to call back or seek an in-person evaluation if the symptoms worsen or if the condition fails to improve as anticipated.    Katheren Sleet, MD    [facility] MD/PA/NP OP Progress Note  12/11/2023 9:04 AM Jon Beck  MRN:  [id number]  Chief Complaint:  Chief Complaint  Patient presents with   Follow-up   HPI:  This is a follow-up appointment for depression, anxiety, ADHD.  He states that he did not start the BuSpar  as he did not take any additional medication.  He has been working on schedule, which has been helpful.  His anxiety has been better in the last few weeks, and it has been more manageable.  He feels his mind is more quiet.  He denies the work to be stressful.  It actually helps him and he enjoys the work.  His parents has been doing good.  He mother will be visiting her friend.  He arranged this.  . he reports good relationship with his wife. he has been going to the gym regularly.  He sleeps fair.  He has a history of snoring.  His fatigue has been improving.  He feels good about weight loss.  He denies SI, HI, hallucinations.  His focus has been good on current medication regimen.  He states that levothyroxine  dose was up titrated a few  months ago.  He agrees with the plans as outlined below.   Vitals: Wt: 283, Wt change: -10 lbs, Ht: 70, BMI: 40.6, Temp: 97.2, Pulse sitting: 74, BP sitting: 124/81. Weight 277 at home this morning.07/2023 248 lbs 11/2023  Wt Readings from Last 3 Encounters:  08/08/22 279 lb 9.6 oz (126.8 kg)  09/06/20 260 lb (117.9 kg)  08/26/20 235 lb (106.6 kg)      Substance use   Tobacco Alcohol Other substances/  Current   Occasional drink CBD/THC, last use in April. Denies caffeine intake (never liked)  Past   Occasional drink denies  Past Treatment           Visit Diagnosis:    ICD-10-CM   1. MDD (major depressive disorder), recurrent, in partial remission (HCC)  F33.41     2. GAD (generalized anxiety disorder)  F41.1     3. Attention deficit hyperactivity disorder (ADHD), unspecified ADHD type [F90.9]  F90.9     4. Insomnia, unspecified type  G47.00     5. High risk medication use  Z79.899     6. Anxiety  F41.9 sertraline  (ZOLOFT ) 100 MG tablet      Past Psychiatric History: Please see initial evaluation for full details. I have reviewed the history. No updates at this time.     Past Medical History:  Past Medical History:  Diagnosis Date   CES (cauda equina syndrome) (HCC)  Hypertension     Past Surgical History:  Procedure Laterality Date   LUMBAR LAMINECTOMY/DECOMPRESSION MICRODISCECTOMY Right 09/07/2020   Procedure: LUMBAR LAMINECTOMY/DECOMPRESSION MICRODISCECTOMY LUMBAR FOUR-FIVE;  Surgeon: Debby Dorn MATSU, MD;  [facility]: [facility];  Service: Neurosurgery;  Laterality: Right;    Family Psychiatric History: Please see initial evaluation for full details. I have reviewed the history. No updates at this time.     Family History: History reviewed. No pertinent family history.  Social History:  Social History   Socioeconomic History   Marital status: Single    Spouse name: Not on file   Number of children: Not on file   Years of education: Not on file   Highest  education level: Not on file  Occupational History   Not on file  Tobacco Use   Smoking status: Never   Smokeless tobacco: Never  Substance and Sexual Activity   Alcohol use: Yes    Comment: Socail    Drug use: No   Sexual activity: Not on file  Other Topics Concern   Not on file  Social History Narrative   Not on file   Social Drivers of Health   Financial Resource Strain: Not on file  Food Insecurity: Not on file  Transportation Needs: Not on file  Physical Activity: Not on file  Stress: Not on file  Social Connections: Not on file    Allergies: No Known Allergies  Metabolic Disorder Labs: No results found for: HGBA1C, MPG No results found for: PROLACTIN No results found for: CHOL, TRIG, HDL, CHOLHDL, VLDL, LDLCALC No results found for: TSH  Therapeutic Level Labs: No results found for: LITHIUM No results found for: VALPROATE No results found for: CBMZ  Current Medications: Current Outpatient Medications  Medication Sig Dispense Refill   ARIPiprazole  (ABILIFY ) 2 MG tablet Take 1 tablet (2 mg total) by mouth at bedtime. 90 tablet 0   Ascorbic Acid (VITAMIN C) 1000 MG tablet 1 tablet     buPROPion  (WELLBUTRIN  XL) 150 MG 24 hr tablet Take 1 tablet (150 mg total) by mouth daily. Take total of 450 mg daily. Take along with 300 mg tab 90 tablet 1   buPROPion  (WELLBUTRIN  XL) 300 MG 24 hr tablet Take 1 tablet (300 mg total) by mouth daily. Total of 450 mg daily. Take along with 150 mg tab 90 tablet 1   clonazePAM  (KLONOPIN ) 1 MG tablet Take 1.5 tablets (1.5 mg total) by mouth at bedtime. 45 tablet 1   [START ON 12/24/2023] clonazePAM  (KLONOPIN ) 1 MG tablet Take 1.5 tablets (1.5 mg total) by mouth at bedtime as needed for anxiety. 45 tablet 1   dexmethylphenidate  (FOCALIN  XR) 10 MG 24 hr capsule Take 1 capsule (10 mg total) by mouth 2 (two) times daily. 60 capsule 0   [START ON 01/10/2024] dexmethylphenidate  (FOCALIN  XR) 10 MG 24 hr capsule Take 1  capsule (10 mg total) by mouth 2 (two) times daily. 60 capsule 0   docusate sodium  (COLACE) 100 MG capsule Take 1 capsule (100 mg total) by mouth 2 (two) times daily. (Patient not taking: Reported on 08/08/2022) 10 capsule 0   ibuprofen (ADVIL,MOTRIN) 200 MG tablet Take 200 mg by mouth 3 (three) times daily as needed. (Patient not taking: Reported on 08/08/2022)     levothyroxine  (SYNTHROID ) 25 MCG tablet Take 50 mcg by mouth daily before breakfast.     [START ON 01/23/2024] methylphenidate  (RITALIN ) 20 MG tablet Take 1 tablet (20 mg total) by mouth every evening. 30 tablet 0   [  START ON 12/24/2023] methylphenidate  (RITALIN ) 20 MG tablet Take 1 tablet (20 mg total) by mouth daily at 12 noon. 30 tablet 0   Multiple Vitamin (MULTIVITAMIN ADULT) TABS 1 tablet     sertraline  (ZOLOFT ) 100 MG tablet Take 2 tablets (200 mg total) by mouth at bedtime. 180 tablet 1   traZODone  (DESYREL ) 50 MG tablet Take 0.5-1 tablets (25-50 mg total) by mouth at bedtime. 90 tablet 0   No current facility-administered medications for this visit.     Musculoskeletal: Strength & Muscle Tone: N/A Gait & Station: N/A Patient leans: N/A  Psychiatric Specialty Exam: Review of Systems  Psychiatric/Behavioral:  Negative for agitation, behavioral problems, confusion, decreased concentration, dysphoric mood, hallucinations, self-injury, sleep disturbance and suicidal ideas. The patient is nervous/anxious. The patient is not hyperactive.   All other systems reviewed and are negative.   There were no vitals taken for this visit.There is no height or weight on file to calculate BMI.  General Appearance: Well Groomed  Eye Contact:  Good  Speech:  Clear and Coherent  Volume:  Normal  Mood:  better  Affect:  Appropriate, Congruent, and calm  Thought Process:  Coherent  Orientation:  Full (Time, Place, and Person)  Thought Content: Logical   Suicidal Thoughts:  No  Homicidal Thoughts:  No  Memory:  Immediate;   Good   Judgement:  Good  Insight:  Good  Psychomotor Activity:  Normal  Concentration:  Concentration: Good and Attention Span: Good  Recall:  Good  Fund of Knowledge: Good  Language: Good  Akathisia:  No  Handed:  Right  AIMS (if indicated): not done  Assets:  Communication Skills Desire for Improvement  ADL's:  Intact  Cognition: WNL  Sleep:  Fair   Screenings: PHQ2-9    Flowsheet Row Office Visit from 08/08/2022 in [facility] Video Visit from 10/20/2020 in [facility] Video Visit from 07/25/2020 in [facility]  PHQ-2 Total Score 0 0 0   Flowsheet Row Video Visit from 01/03/2021 in [facility] Video Visit from 10/20/2020 in [facility] ED to Hosp-Admission (Discharged) from 09/06/2020 in [facility]  C-SSRS RISK CATEGORY Low Risk No Risk No Risk     Assessment and Plan:  Jon Beck is a 43 y.o. year old male with a history of depression,  ADHD,  hypertension,  hyperlipidemia, hypothyroidism, obesity , who presents for follow up appointment for below.   1. MDD (major depressive disorder), recurrent, in partial remission (HCC) 2. GAD (generalized anxiety disorder) History: depression, anxiety, ADHD (reportedly had a test) since 17,    There has been significant improvement in anxiety since the last visit.  In retrospect it could be attributable to change in levothyroxine  dose/condition related to thyroid .  It is also notable that he has been working on structure, she has been certainly helping his mood symptoms.  He denies much concern about depressive symptoms either.  Will continue current medication regimen.  Noted that he has not started BuSpar , and will hold off starting this medication given overall improvement in his mood symptoms.  Will continue  sertraline  to target depression, anxiety.  Will continue bupropion  and Abilify  as adjunctive treatment for depression.  Will have in person visit to check for AIMS.  3. Attention deficit hyperactivity disorder (ADHD), unspecified ADHD type [F90.9] -  He reportedly had 3 psychological testing, first  at age 62.    He reports good benefit from current medication regimen.  Will continue Focalin  and Ritalin  to target ADHD.   4. Insomnia, unspecified type He reports good benefit from trazodone .  He has history of snoring, middle insomnia and fatigue.  Referral was made for evaluation of sleep apnea at his last visit.  He has not received any contact from the office; will check in about this process again.   5. High risk medication use # CBD/THC use  He has been abstinent from CBD.  Will continue motivational interview.         Last checked  EKG HR 98, QTc426 msec 06/2022  Lipid panels  Chol 249 H, TG 126, LDL 181 H 07/2023  HbA1c  Glu 98 07/2023      Plan Continue sertraline  200 mg daily Continue bupropion  450 mg daily (150 mg + 300 mg )  Continue Abilify  2 mg at night  Continue clonazepam  1.5  mg at night as needed for anxiety  Continue Focalin  XR 10 mg twice in AM Continue Ritalin  20 mg in PM - he declined a refill Continue trazodone  25-50 mg at night as needed for insomnia Referred for evaluation of sleep apnea Next appointment: 10/16 at 8 am for 30 mins, video- waitlist for sooner visit - on levothyroxine  75 mcg, uptitrated from 50 mcg - vitamin, testosterone    Past trials of medication: sertraline , Effexor (had zaps), Wellbutrin , Adderall, Adderall XR, Vyvanse , Concerta , Strattera (limited benefit),    The patient demonstrates the following risk factors for suicide: Chronic risk factors for suicide include: psychiatric disorder of depression. Acute risk factors for suicide include: N/A. Protective factors for this patient include: positive social support, coping skills and hope for  the future. Considering these factors, the overall suicide risk at this point appears to be low. Patient is appropriate for outpatient follow up., who presents for follow up appointment for below.     Collaboration of Care: Collaboration of Care: Other reviewed notes in Epic  Patient/Guardian was advised Release of Information must be obtained prior to any record release in order to collaborate their care with an outside provider. Patient/Guardian was advised if they have not already done so to contact the registration department to sign all necessary forms in order for us  to release information regarding their care.   Consent: Patient/Guardian gives verbal consent for treatment and assignment of benefits for services provided during this visit. Patient/Guardian expressed understanding and agreed to proceed.    Katheren Sleet, MD 12/11/2023, 9:04 AM

## 2023-12-11 ENCOUNTER — Telehealth (INDEPENDENT_AMBULATORY_CARE_PROVIDER_SITE_OTHER): Admitting: Psychiatry

## 2023-12-11 ENCOUNTER — Encounter: Payer: Self-pay | Admitting: Psychiatry

## 2023-12-11 DIAGNOSIS — F3341 Major depressive disorder, recurrent, in partial remission: Secondary | ICD-10-CM | POA: Diagnosis not present

## 2023-12-11 DIAGNOSIS — Z79899 Other long term (current) drug therapy: Secondary | ICD-10-CM

## 2023-12-11 DIAGNOSIS — G4700 Insomnia, unspecified: Secondary | ICD-10-CM

## 2023-12-11 DIAGNOSIS — F411 Generalized anxiety disorder: Secondary | ICD-10-CM

## 2023-12-11 DIAGNOSIS — F909 Attention-deficit hyperactivity disorder, unspecified type: Secondary | ICD-10-CM

## 2023-12-11 DIAGNOSIS — F419 Anxiety disorder, unspecified: Secondary | ICD-10-CM

## 2023-12-11 MED ORDER — METHYLPHENIDATE HCL 20 MG PO TABS: 20.0000 mg | ORAL_TABLET | Freq: Every evening | ORAL | 0 refills | Status: DC

## 2023-12-11 MED ORDER — DEXMETHYLPHENIDATE HCL ER 10 MG PO CP24: 10.0000 mg | ORAL_CAPSULE | Freq: Two times a day (BID) | ORAL | 0 refills | Status: DC

## 2023-12-11 MED ORDER — CLONAZEPAM 1 MG PO TABS: 1.5000 mg | ORAL_TABLET | Freq: Every evening | ORAL | 1 refills | Status: DC | PRN

## 2023-12-11 MED ORDER — METHYLPHENIDATE HCL 20 MG PO TABS: 20.0000 mg | ORAL_TABLET | Freq: Every day | ORAL | 0 refills | Status: DC

## 2023-12-11 MED ORDER — SERTRALINE HCL 100 MG PO TABS: 200.0000 mg | ORAL_TABLET | Freq: Every day | ORAL | 1 refills | Status: DC

## 2023-12-11 NOTE — Patient Instructions (Signed)
 Continue sertraline  200 mg daily Continue bupropion  450 mg daily  Continue Abilify  2 mg at night  Continue clonazepam  1.5  mg at night as needed for anxiety  Continue Focalin  XR 10 mg twice in AM Continue Ritalin  20 mg in PM  Continue trazodone  25-50 mg at night as needed for insomnia Referred for evaluation of sleep apnea Next appointment: 10/16 at 8 am

## 2024-01-07 ENCOUNTER — Telehealth: Admitting: Psychiatry

## 2024-02-01 NOTE — Progress Notes (Deleted)
 [facility] MD/PA/NP OP Progress Note  02/01/2024 2:59 PM Jon Beck  MRN:  [id number]  Chief Complaint: No chief complaint on file.  HPI: ***  Substance use   Tobacco Alcohol Other substances/  Current   Occasional drink CBD/THC, last use in April. Denies caffeine intake (never liked)  Past   Occasional drink denies  Past Treatment             Visit Diagnosis: No diagnosis found.  Past Psychiatric History: Please see initial evaluation for full details. I have reviewed the history. No updates at this time.     Past Medical History:  Past Medical History:  Diagnosis Date   CES (cauda equina syndrome) (HCC)    Hypertension     Past Surgical History:  Procedure Laterality Date   LUMBAR LAMINECTOMY/DECOMPRESSION MICRODISCECTOMY Right 09/07/2020   Procedure: LUMBAR LAMINECTOMY/DECOMPRESSION MICRODISCECTOMY LUMBAR FOUR-FIVE;  Surgeon: Debby Dorn MATSU, MD;  Location: [facility] OR;  Service: Neurosurgery;  Laterality: Right;    Family Psychiatric History: Please see initial evaluation for full details. I have reviewed the history. No updates at this time.    Family History: No family history on file.  Social History:  Social History   Socioeconomic History   Marital status: Single    Spouse name: Not on file   Number of children: Not on file   Years of education: Not on file   Highest education level: Not on file  Occupational History   Not on file  Tobacco Use   Smoking status: Never   Smokeless tobacco: Never  Substance and Sexual Activity   Alcohol use: Yes    Comment: Socail    Drug use: No   Sexual activity: Not on file  Other Topics Concern   Not on file  Social History Narrative   Not on file   Social Drivers of Health   Financial Resource Strain: Not on file  Food Insecurity: Not on file  Transportation Needs: Not on file  Physical Activity: Not on file  Stress: Not on file  Social Connections: Not on file    Allergies: No Known Allergies  Metabolic  Disorder Labs: No results found for: HGBA1C, MPG No results found for: PROLACTIN No results found for: CHOL, TRIG, HDL, CHOLHDL, VLDL, LDLCALC No results found for: TSH  Therapeutic Level Labs: No results found for: LITHIUM No results found for: VALPROATE No results found for: CBMZ  Current Medications: Current Outpatient Medications  Medication Sig Dispense Refill   ARIPiprazole  (ABILIFY ) 2 MG tablet Take 1 tablet (2 mg total) by mouth at bedtime. 90 tablet 0   Ascorbic Acid (VITAMIN C) 1000 MG tablet 1 tablet     buPROPion  (WELLBUTRIN  XL) 150 MG 24 hr tablet Take 1 tablet (150 mg total) by mouth daily. Take total of 450 mg daily. Take along with 300 mg tab 90 tablet 1   buPROPion  (WELLBUTRIN  XL) 300 MG 24 hr tablet Take 1 tablet (300 mg total) by mouth daily. Total of 450 mg daily. Take along with 150 mg tab 90 tablet 1   clonazePAM  (KLONOPIN ) 1 MG tablet Take 1.5 tablets (1.5 mg total) by mouth at bedtime. 45 tablet 1   clonazePAM  (KLONOPIN ) 1 MG tablet Take 1.5 tablets (1.5 mg total) by mouth at bedtime as needed for anxiety. 45 tablet 1   dexmethylphenidate  (FOCALIN  XR) 10 MG 24 hr capsule Take 1 capsule (10 mg total) by mouth 2 (two) times daily. 60 capsule 0   dexmethylphenidate  (FOCALIN  XR) 10 MG  24 hr capsule Take 1 capsule (10 mg total) by mouth 2 (two) times daily. 60 capsule 0   docusate sodium  (COLACE) 100 MG capsule Take 1 capsule (100 mg total) by mouth 2 (two) times daily. (Patient not taking: Reported on 08/08/2022) 10 capsule 0   ibuprofen (ADVIL,MOTRIN) 200 MG tablet Take 200 mg by mouth 3 (three) times daily as needed. (Patient not taking: Reported on 08/08/2022)     levothyroxine  (SYNTHROID ) 25 MCG tablet Take 50 mcg by mouth daily before breakfast.     methylphenidate  (RITALIN ) 20 MG tablet Take 1 tablet (20 mg total) by mouth every evening. 30 tablet 0   methylphenidate  (RITALIN ) 20 MG tablet Take 1 tablet (20 mg total) by mouth daily at 12  noon. 30 tablet 0   Multiple Vitamin (MULTIVITAMIN ADULT) TABS 1 tablet     sertraline  (ZOLOFT ) 100 MG tablet Take 2 tablets (200 mg total) by mouth at bedtime. 180 tablet 1   traZODone  (DESYREL ) 50 MG tablet Take 0.5-1 tablets (25-50 mg total) by mouth at bedtime. 90 tablet 0   No current facility-administered medications for this visit.     Musculoskeletal: Strength & Muscle Tone: N/A Gait & Station: N/A Patient leans: N/A  Psychiatric Specialty Exam: Review of Systems  There were no vitals taken for this visit.There is no height or weight on file to calculate BMI.  General Appearance: {Appearance:22683}  Eye Contact:  {[facility] EYE CONTACT:22684}  Speech:  Clear and Coherent  Volume:  Normal  Mood:  {[facility] MOOD:22306}  Affect:  {Affect (PAA):22687}  Thought Process:  Coherent  Orientation:  Full (Time, Place, and Person)  Thought Content: Logical   Suicidal Thoughts:  {ST/HT (PAA):22692}  Homicidal Thoughts:  {ST/HT (PAA):22692}  Memory:  Immediate;   Good  Judgement:  {Judgement (PAA):22694}  Insight:  {Insight (PAA):22695}  Psychomotor Activity:  Normal  Concentration:  Concentration: Good and Attention Span: Good  Recall:  Good  Fund of Knowledge: Good  Language: Good  Akathisia:  No  Handed:  Right  AIMS (if indicated): not done  Assets:  Communication Skills Desire for Improvement  ADL's:  Intact  Cognition: WNL  Sleep:  {[facility] GOOD/FAIR/POOR:22877}   Screenings: [employer] Row Office Visit from 08/08/2022 in [facility] Video Visit from 10/20/2020 in [facility] Video Visit from 07/25/2020 in [facility]  PHQ-2 Total Score 0 0 0   Flowsheet Row Video Visit from 01/03/2021 in [facility] Video Visit from 10/20/2020 in [facility] ED to Hosp-Admission (Discharged)  from 09/06/2020 in [facility]  C-SSRS RISK CATEGORY Low Risk No Risk No Risk     Assessment and Plan:  Jon Beck is a 43 y.o. year old male with a history of depression,  ADHD,  hypertension,  hyperlipidemia, hypothyroidism, obesity , who presents for follow up appointment for below.    1. MDD (major depressive disorder), recurrent, in partial remission (HCC) 2. GAD (generalized anxiety disorder) History: depression, anxiety, ADHD (reportedly had a test) since 17,    There has been significant improvement in anxiety since the last visit.  In retrospect it could be attributable to change in levothyroxine  dose/condition related to thyroid .  It is also notable that he has been working on structure, she has been certainly helping his mood symptoms.  He denies much concern about depressive symptoms either.  Will continue current medication regimen.  Noted that he has not started BuSpar , and will hold off starting this medication given overall improvement in his mood symptoms.  Will continue sertraline  to target depression, anxiety.  Will continue bupropion  and Abilify  as adjunctive treatment for depression.  Will have in person visit to check for AIMS.   3. Attention deficit hyperactivity disorder (ADHD), unspecified ADHD type [F90.9] -  He reportedly had 3 psychological testing, first at age 2.    He reports good benefit from current medication regimen.  Will continue Focalin  and Ritalin  to target ADHD.    4. Insomnia, unspecified type He reports good benefit from trazodone .  He has history of snoring, middle insomnia and fatigue.  Referral was made for evaluation of sleep apnea at his last visit.  He has not received any contact from the office; will check in about this process again.    5. High risk medication use # CBD/THC use  He has been abstinent from CBD.  Will continue motivational interview.         Last checked  EKG HR 98, QTc426 msec 06/2022   Lipid panels  Chol 249 H, TG 126, LDL 181 H 07/2023  HbA1c  Glu 98 07/2023      Plan Continue sertraline  200 mg daily Continue bupropion  450 mg daily (150 mg + 300 mg )  Continue Abilify  2 mg at night  Continue clonazepam  1.5  mg at night as needed for anxiety  Continue Focalin  XR 10 mg twice in AM Continue Ritalin  20 mg in PM - he declined a refill Continue trazodone  25-50 mg at night as needed for insomnia Referred for evaluation of sleep apnea Next appointment: 10/16 at 8 am for 30 mins, video- waitlist for sooner visit - on levothyroxine  75 mcg, uptitrated from 50 mcg - vitamin, testosterone    Past trials of medication: sertraline , Effexor (had zaps), Wellbutrin , Adderall, Adderall XR, Vyvanse , Concerta , Strattera (limited benefit),    The patient demonstrates the following risk factors for suicide: Chronic risk factors for suicide include: psychiatric disorder of depression. Acute risk factors for suicide include: N/A. Protective factors for this patient include: positive social support, coping skills and hope for the future. Considering these factors, the overall suicide risk at this point appears to be low. Patient is appropriate for outpatient follow up., who presents for follow up appointment for below.   Collaboration of Care: Collaboration of Care: {[facility] OP Collaboration of Care:21014065}  Patient/Guardian was advised Release of Information must be obtained prior to any record release in order to collaborate their care with an outside provider. Patient/Guardian was advised if they have not already done so to contact the registration department to sign all necessary forms in order for us  to release information regarding their care.   Consent: Patient/Guardian gives verbal consent for treatment and assignment of benefits for services provided during this visit. Patient/Guardian expressed understanding and agreed to proceed.    Katheren Sleet, MD 02/01/2024, 2:59 PM

## 2024-02-05 ENCOUNTER — Ambulatory Visit: Admitting: Psychiatry

## 2024-02-14 NOTE — Progress Notes (Deleted)
 [facility] MD/PA/NP OP Progress Note  02/14/2024 10:41 AM Jon Beck  MRN:  [id number]  Chief Complaint: No chief complaint on file.  HPI: ***  Substance use   Tobacco Alcohol Other substances/  Current   Occasional drink CBD/THC, last use in April. Denies caffeine intake (never liked)  Past   Occasional drink denies  Past Treatment             Visit Diagnosis: No diagnosis found.  Past Psychiatric History: Please see initial evaluation for full details. I have reviewed the history. No updates at this time.     Past Medical History:  Past Medical History:  Diagnosis Date  . CES (cauda equina syndrome) (HCC)   . Hypertension     Past Surgical History:  Procedure Laterality Date  . LUMBAR LAMINECTOMY/DECOMPRESSION MICRODISCECTOMY Right 09/07/2020   Procedure: LUMBAR LAMINECTOMY/DECOMPRESSION MICRODISCECTOMY LUMBAR FOUR-FIVE;  Surgeon: Debby Dorn MATSU, MD;  Location: [facility] OR;  Service: Neurosurgery;  Laterality: Right;    Family Psychiatric History: Please see initial evaluation for full details. I have reviewed the history. No updates at this time.    Family History: No family history on file.  Social History:  Social History   Socioeconomic History  . Marital status: Single    Spouse name: Not on file  . Number of children: Not on file  . Years of education: Not on file  . Highest education level: Not on file  Occupational History  . Not on file  Tobacco Use  . Smoking status: Never  . Smokeless tobacco: Never  Substance and Sexual Activity  . Alcohol use: Yes    Comment: Socail   . Drug use: No  . Sexual activity: Not on file  Other Topics Concern  . Not on file  Social History Narrative  . Not on file   Social Drivers of Health   Financial Resource Strain: Not on file  Food Insecurity: Not on file  Transportation Needs: Not on file  Physical Activity: Not on file  Stress: Not on file  Social Connections: Not on file    Allergies: No Known  Allergies  Metabolic Disorder Labs: No results found for: HGBA1C, MPG No results found for: PROLACTIN No results found for: CHOL, TRIG, HDL, CHOLHDL, VLDL, LDLCALC No results found for: TSH  Therapeutic Level Labs: No results found for: LITHIUM No results found for: VALPROATE No results found for: CBMZ  Current Medications: Current Outpatient Medications  Medication Sig Dispense Refill  . ARIPiprazole  (ABILIFY ) 2 MG tablet Take 1 tablet (2 mg total) by mouth at bedtime. 90 tablet 0  . Ascorbic Acid (VITAMIN C) 1000 MG tablet 1 tablet    . buPROPion  (WELLBUTRIN  XL) 150 MG 24 hr tablet Take 1 tablet (150 mg total) by mouth daily. Take total of 450 mg daily. Take along with 300 mg tab 90 tablet 1  . buPROPion  (WELLBUTRIN  XL) 300 MG 24 hr tablet Take 1 tablet (300 mg total) by mouth daily. Total of 450 mg daily. Take along with 150 mg tab 90 tablet 1  . clonazePAM  (KLONOPIN ) 1 MG tablet Take 1.5 tablets (1.5 mg total) by mouth at bedtime. 45 tablet 1  . clonazePAM  (KLONOPIN ) 1 MG tablet Take 1.5 tablets (1.5 mg total) by mouth at bedtime as needed for anxiety. 45 tablet 1  . dexmethylphenidate  (FOCALIN  XR) 10 MG 24 hr capsule Take 1 capsule (10 mg total) by mouth 2 (two) times daily. 60 capsule 0  . dexmethylphenidate  (FOCALIN  XR) 10 MG  24 hr capsule Take 1 capsule (10 mg total) by mouth 2 (two) times daily. 60 capsule 0  . docusate sodium  (COLACE) 100 MG capsule Take 1 capsule (100 mg total) by mouth 2 (two) times daily. (Patient not taking: Reported on 08/08/2022) 10 capsule 0  . ibuprofen (ADVIL,MOTRIN) 200 MG tablet Take 200 mg by mouth 3 (three) times daily as needed. (Patient not taking: Reported on 08/08/2022)    . levothyroxine  (SYNTHROID ) 25 MCG tablet Take 50 mcg by mouth daily before breakfast.    . methylphenidate  (RITALIN ) 20 MG tablet Take 1 tablet (20 mg total) by mouth every evening. 30 tablet 0  . methylphenidate  (RITALIN ) 20 MG tablet Take 1 tablet  (20 mg total) by mouth daily at 12 noon. 30 tablet 0  . Multiple Vitamin (MULTIVITAMIN ADULT) TABS 1 tablet    . sertraline  (ZOLOFT ) 100 MG tablet Take 2 tablets (200 mg total) by mouth at bedtime. 180 tablet 1  . traZODone  (DESYREL ) 50 MG tablet Take 0.5-1 tablets (25-50 mg total) by mouth at bedtime. 90 tablet 0   No current facility-administered medications for this visit.     Musculoskeletal: Strength & Muscle Tone: N/A Gait & Station: N/A Patient leans: N/A  Psychiatric Specialty Exam: Review of Systems  There were no vitals taken for this visit.There is no height or weight on file to calculate BMI.  General Appearance: {Appearance:22683}  Eye Contact:  {[facility] EYE CONTACT:22684}  Speech:  Clear and Coherent  Volume:  Normal  Mood:  {B[facility] MOOD:22306}  Affect:  {Affect (PAA):22687}  Thought Process:  Coherent  Orientation:  Full (Time, Place, and Person)  Thought Content: Logical   Suicidal Thoughts:  {ST/HT (PAA):22692}  Homicidal Thoughts:  {ST/HT (PAA):22692}  Memory:  Immediate;   Good  Judgement:  {Judgement (PAA):22694}  Insight:  {Insight (PAA):22695}  Psychomotor Activity:  Normal  Concentration:  Concentration: Good and Attention Span: Good  Recall:  Good  Fund of Knowledge: Good  Language: Good  Akathisia:  No  Handed:  Right  AIMS (if indicated): not done  Assets:  Communication Skills Desire for Improvement  ADL's:  Intact  Cognition: WNL  Sleep:  {[facility] GOOD/FAIR/POOR:22877}   Screenings: [employer] Row Office Visit from 08/08/2022 in [facility] Video Visit from 10/20/2020 in [facility] Video Visit from 07/25/2020 in [facility]  PHQ-2 Total Score 0 0 0   Flowsheet Row Video Visit from 01/03/2021 in [facility] Video Visit from 10/20/2020 in [facility] ED to Hosp-Admission (Discharged) from 09/06/2020 in [facility]  C-SSRS RISK CATEGORY Low Risk No Risk No Risk     Assessment and Plan:  Jon Beck is a 43 y.o. year old male with a history of depression,  ADHD,  hypertension,  hyperlipidemia, hypothyroidism, obesity , who presents for follow up appointment for below.    1. MDD (major depressive disorder), recurrent, in partial remission (HCC) 2. GAD (generalized anxiety disorder) History: depression, anxiety, ADHD (reportedly had a test) since 17,    There has been significant improvement in anxiety since the last visit.  In retrospect it could be attributable to change in levothyroxine  dose/condition related to thyroid .  It is also notable that he has been working on structure, she has been certainly helping his mood symptoms.  He denies much concern about depressive symptoms either.  Will continue current medication regimen.  Noted that he has not started BuSpar , and will hold off starting this medication given overall improvement in his mood symptoms.  Will continue sertraline  to target depression, anxiety.  Will continue bupropion  and Abilify  as adjunctive treatment for depression.  Will have in person visit to check for AIMS.   3. Attention deficit hyperactivity disorder (ADHD), unspecified ADHD type [F90.9] -  He reportedly had 3 psychological testing, first at age 30.    He reports good benefit from current medication regimen.  Will continue Focalin  and Ritalin  to target ADHD.    4. Insomnia, unspecified type He reports good benefit from trazodone .  He has history of snoring, middle insomnia and fatigue.  Referral was made for evaluation of sleep apnea at his last visit.  He has not received any contact from the office; will check in about this process again.    5. High risk medication use # CBD/THC use  He has been abstinent from CBD.  Will continue motivational interview.          Last checked  EKG HR 98, QTc426 msec 06/2022  Lipid panels  Chol 249 H, TG 126, LDL 181 H 07/2023  HbA1c  Glu 98 07/2023      Plan Continue sertraline  200 mg daily Continue bupropion  450 mg daily (150 mg + 300 mg )  Continue Abilify  2 mg at night  Continue clonazepam  1.5  mg at night as needed for anxiety  Continue Focalin  XR 10 mg twice in AM Continue Ritalin  20 mg in PM - he declined a refill Continue trazodone  25-50 mg at night as needed for insomnia Referred for evaluation of sleep apnea Next appointment: 10/16 at 8 am for 30 mins, video- waitlist for sooner visit - on levothyroxine  75 mcg, uptitrated from 50 mcg - vitamin, testosterone    Past trials of medication: sertraline , Effexor (had zaps), Wellbutrin , Adderall, Adderall XR, Vyvanse , Concerta , Strattera (limited benefit),    The patient demonstrates the following risk factors for suicide: Chronic risk factors for suicide include: psychiatric disorder of depression. Acute risk factors for suicide include: N/A. Protective factors for this patient include: positive social support, coping skills and hope for the future. Considering these factors, the overall suicide risk at this point appears to be low. Patient is appropriate for outpatient follow up., who presents for follow up appointment for below.   Collaboration of Care: Collaboration of Care: {[facility] OP Collaboration of Care:21014065}  Patient/Guardian was advised Release of Information must be obtained prior to any record release in order to collaborate their care with an outside provider. Patient/Guardian was advised if they have not already done so to contact the registration department to sign all necessary forms in order for us  to release information regarding their care.   Consent: Patient/Guardian gives verbal consent for treatment and assignment of benefits for services provided during this visit. Patient/Guardian expressed understanding and agreed to proceed.    Katheren Sleet, MD 02/14/2024, 10:41 AM

## 2024-02-17 ENCOUNTER — Telehealth: Admitting: Psychiatry

## 2024-02-27 ENCOUNTER — Encounter: Payer: Self-pay | Admitting: Psychiatry

## 2024-02-27 ENCOUNTER — Telehealth: Admitting: Psychiatry

## 2024-02-27 DIAGNOSIS — G4700 Insomnia, unspecified: Secondary | ICD-10-CM

## 2024-02-27 DIAGNOSIS — F3341 Major depressive disorder, recurrent, in partial remission: Secondary | ICD-10-CM

## 2024-02-27 DIAGNOSIS — F909 Attention-deficit hyperactivity disorder, unspecified type: Secondary | ICD-10-CM

## 2024-02-27 DIAGNOSIS — F411 Generalized anxiety disorder: Secondary | ICD-10-CM

## 2024-02-27 MED ORDER — BUPROPION HCL ER (XL) 150 MG PO TB24
150.0000 mg | ORAL_TABLET | Freq: Every day | ORAL | 1 refills | Status: AC
Start: 2024-03-30 — End: 2024-09-26

## 2024-02-27 MED ORDER — ARIPIPRAZOLE 2 MG PO TABS: 2.0000 mg | ORAL_TABLET | Freq: Every day | ORAL | 0 refills | Status: DC

## 2024-02-27 MED ORDER — DEXMETHYLPHENIDATE HCL ER 10 MG PO CP24: 10.0000 mg | ORAL_CAPSULE | Freq: Two times a day (BID) | ORAL | 0 refills | Status: AC

## 2024-02-27 MED ORDER — CLONAZEPAM 1 MG PO TABS: 1.5000 mg | ORAL_TABLET | Freq: Every evening | ORAL | 2 refills | Status: DC | PRN

## 2024-02-27 MED ORDER — BUPROPION HCL ER (XL) 300 MG PO TB24: 300.0000 mg | ORAL_TABLET | Freq: Every day | ORAL | 1 refills | Status: AC

## 2024-02-27 MED ORDER — DEXMETHYLPHENIDATE HCL ER 10 MG PO CP24: 10.0000 mg | ORAL_CAPSULE | Freq: Two times a day (BID) | ORAL | 0 refills | Status: DC

## 2024-02-27 NOTE — Patient Instructions (Signed)
 Continue sertraline  200 mg daily Continue bupropion  450 mg daily (150 mg + 300 mg )  Continue Abilify  2 mg at night  Continue clonazepam  1.5  mg at night as needed for anxiety  Continue Focalin  XR 10 mg twice in AM Continue Ritalin  20 mg in PM  Hold trazodone   Referred for evaluation of sleep apnea Next appointment: 1/22 at 8 am

## 2024-02-27 NOTE — Progress Notes (Signed)
 Virtual Visit via Video Note  I connected with Jon Beck on 02/27/24 at  8:20 AM EST by a video enabled telemedicine application and verified that I am speaking with the correct person using two identifiers.  Location: Patient: home Provider: home office Persons participated in the visit- patient, provider    I discussed the limitations of evaluation and management by telemedicine and the availability of in person appointments. The patient expressed understanding and agreed to proceed.   I discussed the assessment and treatment plan with the patient. The patient was provided an opportunity to ask questions and all were answered. The patient agreed with the plan and demonstrated an understanding of the instructions.   The patient was advised to call back or seek an in-person evaluation if the symptoms worsen or if the condition fails to improve as anticipated.   Katheren Sleet, MD    [facility] MD/PA/NP OP Progress Note  02/27/2024 8:53 AM Jon Beck  MRN:  [id number]  Chief Complaint:  Chief Complaint  Patient presents with   Follow-up   HPI:  This is a follow-up appointment for depression, anxiety, ADHD and insomnia.  He states that he has headache over the last 5 days.  He usually does not have a headache and he feels nauseated.  He struggles with insomnia.  He does not feel refreshed in the morning.  He tends to go to the bed later due to work.  He has been busy at work due to more responsibility.  There has been changing the leadership.  He has been eating a lot, and is trying to get back on healthy diet.  He has been able to walk regularly.  Although he may feels anxious at times, it has been manageable.  He denies panic attacks.  He denies feeling down or depressed.  He denies SI, HI, hallucinations.  He thinks his focus has been good.   BP 149/97.    Wt Readings from Last 3 Encounters:  08/08/22 279 lb 9.6 oz (126.8 kg)  09/06/20 260 lb (117.9 kg)  08/26/20 235 lb (106.6  kg)     Substance use   Tobacco Alcohol Other substances/  Current   Occasional drink CBD/THC, last use in April. Denies caffeine intake (never liked)  Past   Occasional drink denies  Past Treatment             Visit Diagnosis:    ICD-10-CM   1. MDD (major depressive disorder), recurrent, in partial remission  F33.41 buPROPion  (WELLBUTRIN  XL) 150 MG 24 hr tablet    2. GAD (generalized anxiety disorder)  F41.1     3. Attention deficit hyperactivity disorder (ADHD), unspecified ADHD type [F90.9]  F90.9     4. Insomnia, unspecified type  G47.00       Past Psychiatric History: Please see initial evaluation for full details. I have reviewed the history. No updates at this time.     Past Medical History:  Past Medical History:  Diagnosis Date   CES (cauda equina syndrome) (HCC)    Hypertension     Past Surgical History:  Procedure Laterality Date   LUMBAR LAMINECTOMY/DECOMPRESSION MICRODISCECTOMY Right 09/07/2020   Procedure: LUMBAR LAMINECTOMY/DECOMPRESSION MICRODISCECTOMY LUMBAR FOUR-FIVE;  Surgeon: Debby Dorn MATSU, MD;  Location: [facility] OR;  Service: Neurosurgery;  Laterality: Right;    Family Psychiatric History: Please see initial evaluation for full details. I have reviewed the history. No updates at this time.     Family History: History reviewed. No pertinent family history.  Social History:  Social History   Socioeconomic History   Marital status: Single    Spouse name: Not on file   Number of children: Not on file   Years of education: Not on file   Highest education level: Not on file  Occupational History   Not on file  Tobacco Use   Smoking status: Never   Smokeless tobacco: Never  Substance and Sexual Activity   Alcohol use: Yes    Comment: Socail    Drug use: No   Sexual activity: Not on file  Other Topics Concern   Not on file  Social History Narrative   Not on file   Social Drivers of Health   Financial Resource Strain: Not on file  Food  Insecurity: Not on file  Transportation Needs: Not on file  Physical Activity: Not on file  Stress: Not on file  Social Connections: Not on file    Allergies: No Known Allergies  Metabolic Disorder Labs: No results found for: HGBA1C, MPG No results found for: PROLACTIN No results found for: CHOL, TRIG, HDL, CHOLHDL, VLDL, LDLCALC No results found for: TSH  Therapeutic Level Labs: No results found for: LITHIUM No results found for: VALPROATE No results found for: CBMZ  Current Medications: Current Outpatient Medications  Medication Sig Dispense Refill   dexmethylphenidate  (FOCALIN  XR) 10 MG 24 hr capsule Take 1 capsule (10 mg total) by mouth 2 (two) times daily. 60 capsule 0   [START ON 03/28/2024] dexmethylphenidate  (FOCALIN  XR) 10 MG 24 hr capsule Take 1 capsule (10 mg total) by mouth 2 (two) times daily. 60 capsule 0   [START ON 04/27/2024] dexmethylphenidate  (FOCALIN  XR) 10 MG 24 hr capsule Take 1 capsule (10 mg total) by mouth 2 (two) times daily. 60 capsule 0   [START ON 03/07/2024] ARIPiprazole  (ABILIFY ) 2 MG tablet Take 1 tablet (2 mg total) by mouth at bedtime. 90 tablet 0   Ascorbic Acid (VITAMIN C) 1000 MG tablet 1 tablet     [START ON 03/30/2024] buPROPion  (WELLBUTRIN  XL) 150 MG 24 hr tablet Take 1 tablet (150 mg total) by mouth daily. Take total of 450 mg daily. Take along with 300 mg tab 90 tablet 1   buPROPion  (WELLBUTRIN  XL) 300 MG 24 hr tablet Take 1 tablet (300 mg total) by mouth daily. Total of 450 mg daily. Take along with 150 mg tab 90 tablet 1   clonazePAM  (KLONOPIN ) 1 MG tablet Take 1.5 tablets (1.5 mg total) by mouth at bedtime. 45 tablet 1   clonazePAM  (KLONOPIN ) 1 MG tablet Take 1.5 tablets (1.5 mg total) by mouth at bedtime as needed for anxiety. 45 tablet 2   docusate sodium  (COLACE) 100 MG capsule Take 1 capsule (100 mg total) by mouth 2 (two) times daily. (Patient not taking: Reported on 08/08/2022) 10 capsule 0   ibuprofen  (ADVIL,MOTRIN) 200 MG tablet Take 200 mg by mouth 3 (three) times daily as needed. (Patient not taking: Reported on 08/08/2022)     levothyroxine  (SYNTHROID ) 25 MCG tablet Take 50 mcg by mouth daily before breakfast.     methylphenidate  (RITALIN ) 20 MG tablet Take 1 tablet (20 mg total) by mouth every evening. 30 tablet 0   methylphenidate  (RITALIN ) 20 MG tablet Take 1 tablet (20 mg total) by mouth daily at 12 noon. 30 tablet 0   Multiple Vitamin (MULTIVITAMIN ADULT) TABS 1 tablet     sertraline  (ZOLOFT ) 100 MG tablet Take 2 tablets (200 mg total) by mouth at bedtime. 180 tablet  1   No current facility-administered medications for this visit.     Musculoskeletal: Strength & Muscle Tone: N/A Gait & Station: N/A Patient leans: N/A  Psychiatric Specialty Exam: Review of Systems  Psychiatric/Behavioral:  Positive for sleep disturbance. Negative for agitation, behavioral problems, confusion, decreased concentration, dysphoric mood, hallucinations, self-injury and suicidal ideas. The patient is nervous/anxious. The patient is not hyperactive.   All other systems reviewed and are negative.   There were no vitals taken for this visit.There is no height or weight on file to calculate BMI.  General Appearance: Well Groomed  Eye Contact:  Good  Speech:  Clear and Coherent  Volume:  Normal  Mood:  ok  Affect:  Appropriate, Congruent, and slightly fatigued  Thought Process:  Coherent  Orientation:  Full (Time, Place, and Person)  Thought Content: Logical   Suicidal Thoughts:  No  Homicidal Thoughts:  No  Memory:  Immediate;   Good  Judgement:  Good  Insight:  Good  Psychomotor Activity:  Normal  Concentration:  Concentration: Good and Attention Span: Good  Recall:  Good  Fund of Knowledge: Good  Language: Good  Akathisia:  No  Handed:  Right  AIMS (if indicated): not done  Assets:  Communication Skills Desire for Improvement  ADL's:  Intact  Cognition: WNL  Sleep:  Poor    Screenings: PHQ2-9    Flowsheet Row Office Visit from 08/08/2022 in [facility] Video Visit from 10/20/2020 in [facility] Video Visit from 07/25/2020 in [facility]  PHQ-2 Total Score 0 0 0   Flowsheet Row Video Visit from 01/03/2021 in [facility] Video Visit from 10/20/2020 in [facility] ED to Hosp-Admission (Discharged) from 09/06/2020 in [facility]  C-SSRS RISK CATEGORY Low Risk No Risk No Risk     Assessment and Plan:  Jon Beck is a 43 y.o. year old male with a history of depression,  ADHD,  hypertension,  hyperlipidemia, hypothyroidism, obesity , who presents for follow up appointment for below.   1. MDD (major depressive disorder), recurrent, in partial remission 2. GAD (generalized anxiety disorder) History: depression, anxiety, ADHD (reportedly had a test) since 44,    He denies any significant mood symptoms since the last visit despite experiencing work related stress.  Will continue current medication regimen.  Will continue sertraline  to target depression and anxiety.  Will continue bupropion  and Abilify  as adjunctive treatment for depression.  Will have in person to assess AIMS.  Will continue clonazepam  as needed for anxiety.  3. Attention deficit hyperactivity disorder (ADHD), unspecified ADHD type [F90.9] -  He reportedly had 3 psychological testing, first at age 44.    Overall stable.  Will continue Focalin  and Ritalin  to target ADHD.   4. Long term prescription benzodiazepine use -Worsening in depression/anxiety in the context of tapering down clonazepam  from 1.5 mg to 1 mg, tapered down from 2 mg in Feb 2024 - UDS positive for cannabinoid, 06/2023 Although he is willing to taper down clonazepam , he had worsening in his mood  symptoms when tried to taper down.  Will continue current dose of clonazepam  to target anxiety.   4. Insomnia, unspecified type He self discontinued trazodone  due to worsening and insomnia.  He has a history of snoring, middle insomnia and fatigue.  Referral was made for evaluation of sleep apnea.  He expressed understanding to contact  the clinic to make an appointment.   # Hypertension He states that his home blood pressure tends to be a little high lately.  He expressed understanding about possible adverse reaction of hypertension from stimulant, bupropion .  He will have PCP visit soon, and he agrees to discuss this with his PCP.    5. High risk medication use # CBD/THC use  He has been abstinent from CBD.  Will continue motivational interview.         Last checked  EKG HR 98, QTc426 msec 06/2022  Lipid panels  Chol 249 H, TG 126, LDL 181 H 07/2023  HbA1c  Glu 98 07/2023      Plan Continue sertraline  200 mg daily Continue bupropion  450 mg daily (150 mg + 300 mg )  Continue Abilify  2 mg at night  Continue clonazepam  1.5  mg at night as needed for anxiety  Continue Focalin  XR 10 mg twice in AM Continue Ritalin  20 mg in PM - he declined a refill Hold trazodone  (worsening in insomnia) Referred for evaluation of sleep apnea Next appointment: 1/22 at 8 am, IP - on levothyroxine  75 mcg, uptitrated from 50 mcg - vitamin, testosterone    Past trials of medication: sertraline , Effexor (had zaps), Wellbutrin , Adderall, Adderall XR, Vyvanse , Concerta , Strattera (limited benefit),    The patient demonstrates the following risk factors for suicide: Chronic risk factors for suicide include: psychiatric disorder of depression. Acute risk factors for suicide include: N/A. Protective factors for this patient include: positive social support, coping skills and hope for the future. Considering these factors, the overall suicide risk at this point appears to be low. Patient is appropriate for outpatient  follow up., who presents for follow up appointment for below.   Collaboration of Care: Collaboration of Care: Other reviewed notes in Epic  Patient/Guardian was advised Release of Information must be obtained prior to any record release in order to collaborate their care with an outside provider. Patient/Guardian was advised if they have not already done so to contact the registration department to sign all necessary forms in order for us  to release information regarding their care.   Consent: Patient/Guardian gives verbal consent for treatment and assignment of benefits for services provided during this visit. Patient/Guardian expressed understanding and agreed to proceed.    Katheren Sleet, MD 02/27/2024, 8:53 AM

## 2024-03-15 ENCOUNTER — Telehealth: Payer: Self-pay

## 2024-03-15 ENCOUNTER — Other Ambulatory Visit: Payer: Self-pay | Admitting: Psychiatry

## 2024-03-15 MED ORDER — METHYLPHENIDATE HCL 20 MG PO TABS: 20.0000 mg | ORAL_TABLET | Freq: Every day | ORAL | 0 refills | Status: AC

## 2024-03-15 MED ORDER — METHYLPHENIDATE HCL 20 MG PO TABS: 20.0000 mg | ORAL_TABLET | Freq: Every evening | ORAL | 0 refills | Status: DC

## 2024-03-15 NOTE — Telephone Encounter (Signed)
 Pt Is calling in regards for a REFILL of MEDICATION of  methylphenidate  (RITALIN ) 20 MG tablet.   Last seen: 02/27/2024

## 2024-03-15 NOTE — Telephone Encounter (Signed)
 Ordered

## 2024-05-03 ENCOUNTER — Encounter: Payer: Self-pay | Admitting: Neurology

## 2024-05-03 ENCOUNTER — Telehealth: Payer: Self-pay | Admitting: Psychiatry

## 2024-05-03 ENCOUNTER — Ambulatory Visit: Admitting: Neurology

## 2024-05-03 VITALS — BP 132/84 | HR 87 | Ht 71.0 in | Wt 288.0 lb

## 2024-05-03 DIAGNOSIS — F19982 Other psychoactive substance use, unspecified with psychoactive substance-induced sleep disorder: Secondary | ICD-10-CM | POA: Diagnosis not present

## 2024-05-03 DIAGNOSIS — E66813 Obesity, class 3: Secondary | ICD-10-CM | POA: Diagnosis not present

## 2024-05-03 DIAGNOSIS — Z9189 Other specified personal risk factors, not elsewhere classified: Secondary | ICD-10-CM | POA: Insufficient documentation

## 2024-05-03 DIAGNOSIS — F519 Sleep disorder not due to a substance or known physiological condition, unspecified: Secondary | ICD-10-CM | POA: Diagnosis not present

## 2024-05-03 DIAGNOSIS — R0683 Snoring: Secondary | ICD-10-CM | POA: Diagnosis not present

## 2024-05-03 NOTE — Telephone Encounter (Signed)
 Spoke to patient discuss the message and directions for the bupropion  patient voiced understanding

## 2024-05-03 NOTE — Patient Instructions (Addendum)
 Quality Sleep Information, Adult Quality sleep is important for your mental and physical health. It also improves your quality of life. Quality sleep means you: Are asleep for most of the time you are in bed. Fall asleep within 30 minutes. Wake up no more than once a night. Are awake for no longer than 20 minutes if you do wake up during the night. Most adults need 7-8 hours of quality sleep each night. How can poor sleep affect me? If you do not get enough quality sleep, you may have: Mood swings. Daytime sleepiness. Decreased alertness, reaction time, and concentration. Sleep disorders, such as insomnia and sleep apnea. Difficulty with: Solving problems. Coping with stress. Paying attention. These issues may affect your performance and productivity at work, school, and home. Lack of sleep may also put you at higher risk for accidents, suicide, and risky behaviors. If you do not get quality sleep, you may also be at higher risk for several health problems, including: Infections. Type 2 diabetes. Heart disease. High blood pressure. Obesity. Worsening of long-term conditions, like arthritis, kidney disease, depression, Parkinson's disease, and epilepsy. What actions can I take to get more quality sleep? Sleep schedule and routine Stick to a sleep schedule. Go to sleep and wake up at about the same time each day. Do not try to sleep less on weekdays and make up for lost sleep on weekends. This does not work. Limit naps during the day to 30 minutes or less. Do not take naps in the late afternoon. Make time to relax before bed. Reading, listening to music, or taking a hot bath promotes quality sleep. Make your bedroom a place that promotes quality sleep. Keep your bedroom dark, quiet, and at a comfortable room temperature. Make sure your bed is comfortable. Avoid using electronic devices that give off bright blue light for 30 minutes before bedtime. Your brain perceives bright blue light  as sunlight. This includes television, phones, and computers. If you are lying awake in bed for longer than 20 minutes, get up and do a relaxing activity until you feel sleepy. Lifestyle     Try to get at least 30 minutes of exercise on most days. Do not exercise 2-3 hours before going to bed. Do not use any products that contain nicotine or tobacco. These products include cigarettes, chewing tobacco, and vaping devices, such as e-cigarettes. If you need help quitting, ask your health care provider. Do not drink caffeinated beverages for at least 8 hours before going to bed. Coffee, tea, and some sodas contain caffeine. Do not drink alcohol or eat large meals close to bedtime. Try to get at least 30 minutes of sunlight every day. Morning sunlight is best. Medical concerns Work with your health care provider to treat medical conditions that may affect sleeping, such as: Nasal obstruction. Snoring. Sleep apnea and other sleep disorders. Talk to your health care provider if you think any of your prescription medicines may cause you to have difficulty falling or staying asleep. If you have sleep problems, talk with a sleep consultant. If you think you have a sleep disorder, talk with your health care provider about getting evaluated by a specialist. Where to find more information Sleep Foundation: sleepfoundation.org American Academy of Sleep Medicine: aasm.org Centers for Disease Control and Prevention (CDC): tonerpromos.no Contact a health care provider if: You have trouble getting to sleep or staying asleep. You often wake up very early in the morning and cannot get back to sleep. You have daytime sleepiness. You  have daytime sleep attacks of suddenly falling asleep and sudden muscle weakness (narcolepsy). You have a tingling sensation in your legs with a strong urge to move your legs (restless legs syndrome). You stop breathing briefly during sleep (sleep apnea). You think you have a sleep  disorder or are taking a medicine that is affecting your quality of sleep. Summary Most adults need 7-8 hours of quality sleep each night. Getting enough quality sleep is important for your mental and physical health. Make your bedroom a place that promotes quality sleep, and avoid things that may cause you to have poor sleep, such as alcohol, caffeine, smoking, or large meals. Talk to your health care provider if you have trouble falling asleep or staying asleep. This information is not intended to replace advice given to you by your health care provider. Make sure you discuss any questions you have with your health care provider. Document Revised: 08/01/2021 Document Reviewed: 08/01/2021 Elsevier Patient Education  2024 [employer]  Insomnia Insomnia is a sleep disorder that makes it difficult to fall asleep or stay asleep. Insomnia can cause fatigue, low energy, difficulty concentrating, mood swings, and poor performance at work or school. There are three different ways to classify insomnia: Difficulty falling asleep. Difficulty staying asleep. Waking up too early in the morning. Any type of insomnia can be long-term (chronic) or short-term (acute). Both are common. Short-term insomnia usually lasts for 3 months or less. Chronic insomnia occurs at least three times a week for longer than 3 months. What are the causes? Insomnia may be caused by another condition, situation, or substance, such as: Having certain mental health conditions, such as anxiety and depression. Using caffeine, alcohol, tobacco, or drugs. Having gastrointestinal conditions, such as gastroesophageal reflux disease (GERD). Having certain medical conditions. These include: Asthma. Alzheimer's disease. Stroke. Chronic pain. An overactive thyroid  gland (hyperthyroidism). Other sleep disorders, such as restless legs syndrome and sleep apnea. Menopause. Sometimes, the cause of insomnia may not be known. What  increases the risk? Risk factors for insomnia include: Gender. Females are affected more often than males. Age. Insomnia is more common as people get older. Stress and certain medical and mental health conditions. Lack of exercise. Having an irregular work schedule. This may include working night shifts and traveling between different time zones. What are the signs or symptoms? If you have insomnia, the main symptom is having trouble falling asleep or having trouble staying asleep. This may lead to other symptoms, such as: Feeling tired or having low energy. Feeling nervous about going to sleep. Not feeling rested in the morning. Having trouble concentrating. Feeling irritable, anxious, or depressed. How is this diagnosed? This condition may be diagnosed based on: Your symptoms and medical history. Your health care provider may ask about: Your sleep habits. Any medical conditions you have. Your mental health. A physical exam. How is this treated? Treatment for insomnia depends on the cause. Treatment may focus on treating an underlying condition that is causing the insomnia. Treatment may also include: Medicines to help you sleep. Counseling or therapy. Lifestyle adjustments to help you sleep better. Follow these instructions at home: Eating and drinking  Limit or avoid alcohol, caffeinated beverages, and products that contain nicotine and tobacco, especially close to bedtime. These can disrupt your sleep. Do not eat a large meal or eat spicy foods right before bedtime. This can lead to digestive discomfort that can make it hard for you to sleep. Sleep habits  Keep a sleep diary to help you  and your health care provider figure out what could be causing your insomnia. Write down: When you sleep. When you wake up during the night. How well you sleep and how rested you feel the next day. Any side effects of medicines you are taking. What you eat and drink. Make your bedroom a  dark, comfortable place where it is easy to fall asleep. Put up shades or blackout curtains to block light from outside. Use a white noise machine to block noise. Keep the temperature cool. Limit screen use before bedtime. This includes: Not watching TV. Not using your smartphone, tablet, or computer. Stick to a routine that includes going to bed and waking up at the same times every day and night. This can help you fall asleep faster. Consider making a quiet activity, such as reading, part of your nighttime routine. Try to avoid taking naps during the day so that you sleep better at night. Get out of bed if you are still awake after 15 minutes of trying to sleep. Keep the lights down, but try reading or doing a quiet activity. When you feel sleepy, go back to bed. General instructions Take over-the-counter and prescription medicines only as told by your health care provider. Exercise regularly as told by your health care provider. However, avoid exercising in the hours right before bedtime. Use relaxation techniques to manage stress. Ask your health care provider to suggest some techniques that may work well for you. These may include: Breathing exercises. Routines to release muscle tension. Visualizing peaceful scenes. Make sure that you drive carefully. Do not drive if you feel very sleepy. Keep all follow-up visits. This is important. Contact a health care provider if: You are tired throughout the day. You have trouble in your daily routine due to sleepiness. You continue to have sleep problems, or your sleep problems get worse. Get help right away if: You have thoughts about hurting yourself or someone else. Get help right away if you feel like you may hurt yourself or others, or have thoughts about taking your own life. Go to your nearest emergency room or: Call 911. Call the National Suicide Prevention Lifeline at 661-763-1125 or 988. This is open 24 hours a day. Text the Crisis  Text Line at 828-212-2750. Summary Insomnia is a sleep disorder that makes it difficult to fall asleep or stay asleep. Insomnia can be long-term (chronic) or short-term (acute). Treatment for insomnia depends on the cause. Treatment may focus on treating an underlying condition that is causing the insomnia. Keep a sleep diary to help you and your health care provider figure out what could be causing your insomnia. This information is not intended to replace advice given to you by your health care provider. Make sure you discuss any questions you have with your health care provider. Document Revised: 03/19/2021 Document Reviewed: 03/19/2021 Elsevier Patient Education  2024 Elsevier Inc.   In short, Jon Beck  is presenting with non -restorative sleep, non refreshing sleep even after a night of un-interrupted sleep.   He sleeps without alarm, has fluent work hours and a long commute.   No nocturia, rare  AM headaches, no recent GERD .  He reports waking startled after choking,  cannot breathe and this happened while sleeping supine.  ,  Risk factors for OSA were present,  including : Body mass index is 40.17 kg/m., neck size  20  and  significant risk are present by upper airway anatomy. In addition ADHD on Ritalin   for many  years dx at age 87.   He has taken Wellbutrin  in PM to my surprise, I like to ask Dr Ozie input - can he take it AM.   Thyroid  function is well controlled on synthroid . 75 mcgr.    My Plan is to proceed with:  HST/ PSG SPLIT  [employer] will insist on HST anyway-  Start to screening test.  Sansa or watch pat. No preference.  No risk factors for central apnea.    I plan to follow up personally within the next ...months /through our NP within 4-6 months.   A total time of  45  minutes consistent of a part of face to face encounter , exam and interview,  and additional preparation time for chart review was spent .  At today's visit, we discussed treatment options,  associated risk and benefits, and engage in counseling as needed including, but not limited to:  Sleep hygiene, Quality Sleep Habits, and Safety concerns for patients with daytime sleepiness who are warned to not operate machinery/ motor vehicles when drowsy. Risk factors for sleep apnea were identified:  Additionally, the following were reviewed: Past medical records, past medical and surgical history, family and social background, as well as relevant laboratory results, imaging findings, and medical notes, where applicable.  This note was generated by myself in part by using dictation software, and as a result, it may contain unintentional typos and errors.  Nevertheless, effort was made to accurately convey the pertinent aspects of the patient's visit.   Dedra Gores, MD  [facility] Sleep Board certified in Sleep Medicine by [employer] and Diplomate of the [employer] (AASM) . Board certified In Neurology, Diplomat of the ABPN,  Fellow of the [employer].

## 2024-05-03 NOTE — Progress Notes (Signed)
 "  @[facility]   Provider:  Dedra Gores, MD   Primary Care Physician:  Regino Slater, MD [address] [place] [address]   Referring Provider: Vickey Mettle, Md [address] [place],  [place] [address]        Chief Concern for this Consultation:   Patient presents with     reports he gets about 6-7 hrs of sleep nightly. Does snore, poor quality of sleep and has daytime fatigue. Has woken up choking, gasping for breath in his sleep. Father has OSA.      HPI: I have the pleasure of meeting with Jon Beck , on 05/03/2024 , who is a 44 y.o.  male patient,  seen upon a referral by Dr Vickey  for a  Sleep Medicine Consultation.  The patient's referral information asked for an evaluation of Sleep apnea ,based on family history  The patient has a dx of ADHD.but presented with a medical history of  CES (cauda equina syndrome) spine surgery  for slipped disc (HCC) and Hypertension. The patient reports onset of symptoms over a time period of 12 months, with the last 3 months the most unrestored unrefreshed sleep he ever had, he gained 30 pounds in the last year-  lots of stress at work, no time to eat well, not exercising.  He goes to bed earlier, 9 Pm and wakes up with trouble to breathe, has a remote history of GERD,  Thyroid  disease,  Mood disorders, anxiety, not so much Depression.  The patient had no previous sleep evaluations.    Family medical history: There are  biological family members affected by Sleep apnea ( father ).    Social history:he is working as surveyor, minerals, lab  R and D , .  He lives in a private home, in a household with fiance . The patient currently works  a fluent schedule-  . The workplace involves little physical activity, no outdoor activity, no travel.  Nicotine use: /.  ETOH use: rare,  Caffeine intake in form of: Coffee (/), Soft drinks (/), Tea ( yes) and occasional AM Energy drinks ( including those containing  taurine ).  Caffeine is last consumed in AM .  Ritalin  taken at 1 PM.  Exercises not regularly . [place] .      Sleep habits and routines are as follows: The patient's dinner time is around 6-7 PM.  Evening time is spent by tinkering in the garage .  The patient goes to bed at, or close to, 9 PM.  Its recently hard to fall asleep-  trazodone  made it worse.  The bedroom is shared with fiance and is described as cool, quiet, and dark.  The patient reports that it takes 5-30  minutes to fall asleep, then continues to sleep for 7 hours, uninterrupted or not woken by nocturia.  Sleep choking wakes him.  The preferred sleep position is supine, with support of 2 pillows, (non- adjustable bed/ ). The total estimated sleep time is circa 6 hours.  Dreams are reportedly frequent and can be vivid. Dream enactment has not been reported.  Trazodone  caused wild dreams.   7-8 AM is the usual week- day rise time. The patient wakes up spontaneously/ with an alarm set at . Last year he woke up between 4.30 and 5 AM and worked =out first for an hour !)  he commutes 1 hours to work.  He reports no longer  feeling  refreshed and restored in the morning,  waking with symptoms such as dry mouth, sometimes morning headaches, and fatigue.   No sleep paralysis has been experienced.  Naps in daytime are rare -  (there is a desire to nap and opportunity)- lasting from 10 to 30 and have no longer  a refreshing quality. These do not interfere with nocturnal sleep.    Review of Systems: Out of a complete 14 system review, the patient complains of only the following symptoms, and all other reviewed systems are negative.:    Insomnia /  Depression/ anxiety: no dream acting out, just sleep talking.   Some AM  Headaches     Snoring, Sleep fragmentation from choking .    How likely are you to doze in the following situations: 0 = not likely, 1 = slight chance, 2 = moderate chance, 3 = high chance Sitting and  Reading? Watching Television? Sitting inactive in a public place (theater or meeting)? As a passenger in a car for an hour without a break? Lying down in the afternoon when circumstances permit? Sitting and talking to someone? Sitting quietly after lunch without alcohol? In a car, while stopped for a few minutes in traffic?   Total ESS =5 / 24 points.    FSS endorsed at 58/ 63 points.  GDS:  Social History   Socioeconomic History   Marital status: Single    Spouse name: Not on file   Number of children: Not on file   Years of education: Not on file   Highest education level: Not on file  Occupational History   Not on file  Tobacco Use   Smoking status: Never   Smokeless tobacco: Never  Substance and Sexual Activity   Alcohol use: Yes    Comment: Socail    Drug use: No   Sexual activity: Not on file  Other Topics Concern   Not on file  Social History Narrative   Not on file   Social Drivers of Health   Tobacco Use: Low Risk (05/03/2024)   Patient History    Smoking Tobacco Use: Never    Smokeless Tobacco Use: Never    Passive Exposure: Not on file  Financial Resource Strain: Not on file  Food Insecurity: Not on file  Transportation Needs: Not on file  Physical Activity: Not on file  Stress: Not on file  Social Connections: Not on file  Depression (PHQ2-9): Low Risk (08/08/2022)   Depression (PHQ2-9)    PHQ-2 Score: 0  Alcohol Screen: Not on file  Housing: Not on file  Utilities: Not on file  Health Literacy: Not on file    Family History  Problem Relation Age of Onset   Sleep apnea Father     Past Medical History:  Diagnosis Date   CES (cauda equina syndrome) (HCC)    Hypertension     Past Surgical History:  Procedure Laterality Date   LUMBAR LAMINECTOMY/DECOMPRESSION MICRODISCECTOMY Right 09/07/2020   Procedure: LUMBAR LAMINECTOMY/DECOMPRESSION MICRODISCECTOMY LUMBAR FOUR-FIVE;  Surgeon: Debby Dorn MATSU, MD;  Location: [facility] OR;  Service:  Neurosurgery;  Laterality: Right;     Medications Ordered Prior to Encounter[1]  Allergies[2]  Vitals:   05/03/24 0921  BP: 132/84  Pulse: 87       Physical exam:   General: The patient was alert and appears not in acute distress.  Mood and affect are appropriate .  The patient's interactions are: Cooperative, makes eye contact, follows the instructions and answers questions coherently.  The  patient is groomed and appropriately groomed and dressed. Head: Normocephalic, atraumatic.  Neck is supple. Mallampati: 3 plus  small mouth, small jaw, .  The neck circumference measured 20 inches. Nasal airflow barely was patent ,   Overbite / Retrognathia was noted.  Dental status: biology  Cardiovascular:  Regular rate and cardiac rhythm by palpable pulse. Respiratory: no audible wheezing, no tachypnoea.   Skin:  Without evidence of ankle edema. No discoloration.  Trunk:  BMI is 40. 17  The patient's posture was erect.   Neurologic exam : The patient was awake and alert, oriented to place and time.   Attention span & concentration ability appeared normal.  Speech was fluent, without dysarthria, dysphonia or aphasia, and of normal volume.     Cranial nerves:  There was no loss of smell or taste reported  Pupils are round, equal in size and briskly reactive to light.  Funduscopic exam was deferred.  Extraocular movements in vertical and horizontal planes were intact and without nystagmus. (No Diplopia reported). Visual fields by finger perimetry are intact.  Hearing was intact to soft voice.    Facial sensation intact to fine touch.  Facial motor strength: Symmetric movement and tongue and uvula move midline.  Neck ROM: rotation, tilt and flexion extension were intact for age and shoulder shrug was symmetrical.    Motor exam: Symmetric bulk, strength and ROM.   Normal tone without cog- wheeling, and symmetric grip strength.   Sensory:  vibration intact   Coordination: The  patient reported no problems with button closure and no changes to penmanship.   The Finger-to-nose maneuver was intact without evidence of ataxia, dysmetria or tremor.   Gait and station: Patient could rise unassisted from a seated position, without bracing, and walked without assistive device.  Stance was of wide width. The patient turned with 3 steps.  Toe and heel walk were deferred. No limp was noted.  Arm swing was preserved.    Deep tendon reflexes: Upper extremities did show symmetric DTRs. Lower extremity DTRs were symmetric and brisk/ attenuated.   Babinski response was deferred .   I would like to thank  Vickey Mettle, Md [address] [place],  [place] [address] for allowing me to meet with this pleasant patient.    In short, Mr Mcnatt  is presenting with non -restorative sleep, non refreshing sleep even after a night of un-interrupted sleep.   He sleeps without alarm, has fluent work hours and a long commute.   No nocturia, rare  AM headaches, no recent GERD .  He reports waking startled after choking,  cannot breathe and this happened while sleeping supine.  ,  Risk factors for OSA were present,  including : Body mass index is 40.17 kg/m., neck size  20  and  significant risk are present by upper airway anatomy. In addition ADHD on Ritalin   for many years dx at age 25.   He has taken Wellbutrin  in PM to my surprise, I like to ask Dr Ozie input - can he take it AM.   Thyroid  function is well controlled on synthroid . 75 mcgr.    My Plan is to proceed with:  HST/ PSG SPLIT  [employer] will insist on HST anyway-  Start to screening test.  Sansa or watch pat. No preference.  No risk factors for central apnea.   Weight loss, referral to weight loss within [place] ?  May get approved for Zepbound/ Mounjaro.    I plan  to follow up personally within the next months /through our NP within 4-6 months.   A total time of  45  minutes consistent of a part of face to  face encounter , exam and interview,  and additional preparation time for chart review was spent .  At today's visit, we discussed treatment options, associated risk and benefits, and engage in counseling as needed including, but not limited to:  Sleep hygiene, Quality Sleep Habits, and Safety concerns for patients with daytime sleepiness who are warned to not operate machinery/ motor vehicles when drowsy. Risk factors for sleep apnea were identified:  Additionally, the following were reviewed: Past medical records, past medical and surgical history, family and social background, as well as relevant laboratory results, imaging findings, and medical notes, where applicable.  This note was generated by myself in part by using dictation software, and as a result, it may contain unintentional typos and errors.  Nevertheless, effort was made to accurately convey the pertinent aspects of the patient's visit.   Dedra Gores, MD  [facility] Sleep Board certified in Sleep Medicine by [employer] and Diplomate of the [employer] (AASM) . Board certified In Neurology, Diplomat of the ABPN,  Fellow of the [employer].         [1]  Current Outpatient Medications on File Prior to Visit  Medication Sig Dispense Refill   ARIPiprazole  (ABILIFY ) 2 MG tablet Take 1 tablet (2 mg total) by mouth at bedtime. 90 tablet 0   Ascorbic Acid (VITAMIN C) 1000 MG tablet 1 tablet     buPROPion  (WELLBUTRIN  XL) 150 MG 24 hr tablet Take 1 tablet (150 mg total) by mouth daily. Take total of 450 mg daily. Take along with 300 mg tab 90 tablet 1   buPROPion  (WELLBUTRIN  XL) 300 MG 24 hr tablet Take 1 tablet (300 mg total) by mouth daily. Total of 450 mg daily. Take along with 150 mg tab 90 tablet 1   clonazePAM  (KLONOPIN ) 1 MG tablet Take 1.5 tablets (1.5 mg total) by mouth at bedtime. 45 tablet 1   clonazePAM  (KLONOPIN ) 1 MG  tablet Take 1.5 tablets (1.5 mg total) by mouth at bedtime as needed for anxiety. 45 tablet 2   dexmethylphenidate  (FOCALIN  XR) 10 MG 24 hr capsule Take 1 capsule (10 mg total) by mouth 2 (two) times daily. 60 capsule 0   levothyroxine  (SYNTHROID ) 25 MCG tablet Take 50 mcg by mouth daily before breakfast.     Multiple Vitamin (MULTIVITAMIN ADULT) TABS 1 tablet     sertraline  (ZOLOFT ) 100 MG tablet Take 2 tablets (200 mg total) by mouth at bedtime. 180 tablet 1   methylphenidate  (RITALIN ) 20 MG tablet Take 1 tablet (20 mg total) by mouth daily at 12 noon. (Patient not taking: Reported on 05/03/2024) 30 tablet 0   No current facility-administered medications on file prior to visit.  [2] No Known Allergies  "

## 2024-05-03 NOTE — Telephone Encounter (Signed)
 Please advise the patient that, according to the recent visit with Dr. Gailen, he appears to be taking bupropion  at night. It is better to take this medication in the morning to reduce the risk of insomnia. Please advise the following.   Tonight: Take bupropion  300 mg at night. (Instead of 450 mg) Tomorrow morning: Take bupropion  150 mg in the morning. (No bupropion  at night) Day after tomorrow: Take bupropion  450 mg in the morning (all together).  Thanks,

## 2024-05-08 NOTE — Progress Notes (Unsigned)
 Virtual Visit via Video Note  I connected with Jon Beck on 05/13/24 at  8:00 AM EST by a video enabled telemedicine application and verified that I am speaking with the correct person using two identifiers.  Location: Patient: work Provider: office Persons participated in the visit- patient, provider    I discussed the limitations of evaluation and management by telemedicine and the availability of in person appointments. The patient expressed understanding and agreed to proceed.   I discussed the assessment and treatment plan with the patient. The patient was provided an opportunity to ask questions and all were answered. The patient agreed with the plan and demonstrated an understanding of the instructions.   The patient was advised to call back or seek an in-person evaluation if the symptoms worsen or if the condition fails to improve as anticipated.    Katheren Sleet, MD   [facility] MD/PA/NP OP Progress Note  05/13/2024 8:37 AM Jon Beck  MRN:  [id number]  Chief Complaint:  Chief Complaint  Patient presents with   Follow-up   HPI:  - he was seen by Dr. Chalice for sleep evaluation. HST is scheduled.   This is a follow-up appointment for depression, anxiety, ADHD.  He states that he has been doing good.  He is not busy due to the new project, which will be done tomorrow.  He has been more anxious than usual due to added responsibility.  He feels similar to what he used to be feeling, although he denies much concern at this time.  He notices that he has some difficulty in finding words when he speaks to someone when he is at work.  He has been able to relax at home.  He has middle insomnia, which is worsening in the last few months.  He acknowledges that his weight gain.  He attributes this to diet and lack of exercise.  He and his wife is trying to get back on these.  He reports difficulty in concentration and has been taking a higher dose of methylphenidate  with good benefit.   He agrees not to self adjust the medication moving forward.  He denies feeling depressed.  He denies SI, HI, hallucinations.  He has been taking bupropion  in the morning and he denies any side effect in the last few days.   04/2024 BP 132/84 Pulse 87   Wt Readings from Last 3 Encounters:  05/03/24 288 lb (130.6 kg)  08/08/22 279 lb 9.6 oz (126.8 kg)  09/06/20 260 lb (117.9 kg)     Substance use   Tobacco Alcohol Other substances/  Current   Occasional drink CBD/THC, last use a few months ago drinks tea at times (never liked coffee)  Past   Occasional drink denies  Past Treatment             Visit Diagnosis:    ICD-10-CM   1. MDD (major depressive disorder), recurrent, in partial remission  F33.41     2. GAD (generalized anxiety disorder)  F41.1     3. Attention deficit hyperactivity disorder (ADHD), unspecified ADHD type [F90.9]  F90.9     4. Anxiety  F41.9 sertraline  (ZOLOFT ) 100 MG tablet      Past Psychiatric History: Please see initial evaluation for full details. I have reviewed the history. No updates at this time.     Past Medical History:  Past Medical History:  Diagnosis Date   CES (cauda equina syndrome) (HCC)    Hypertension     Past Surgical History:  Procedure Laterality Date   LUMBAR LAMINECTOMY/DECOMPRESSION MICRODISCECTOMY Right 09/07/2020   Procedure: LUMBAR LAMINECTOMY/DECOMPRESSION MICRODISCECTOMY LUMBAR FOUR-FIVE;  Surgeon: Debby Dorn MATSU, MD;  Location: [facility] OR;  Service: Neurosurgery;  Laterality: Right;    Family Psychiatric History: Please see initial evaluation for full details. I have reviewed the history. No updates at this time.     Family History:  Family History  Problem Relation Age of Onset   Sleep apnea Father     Social History:  Social History   Socioeconomic History   Marital status: Single    Spouse name: Not on file   Number of children: Not on file   Years of education: Not on file   Highest education level: Not  on file  Occupational History   Not on file  Tobacco Use   Smoking status: Never   Smokeless tobacco: Never  Substance and Sexual Activity   Alcohol use: Yes    Comment: Socail    Drug use: No   Sexual activity: Not on file  Other Topics Concern   Not on file  Social History Narrative   Not on file   Social Drivers of Health   Tobacco Use: Low Risk (05/03/2024)   Patient History    Smoking Tobacco Use: Never    Smokeless Tobacco Use: Never    Passive Exposure: Not on file  Financial Resource Strain: Not on file  Food Insecurity: Not on file  Transportation Needs: Not on file  Physical Activity: Not on file  Stress: Not on file  Social Connections: Not on file  Depression (PHQ2-9): Low Risk (08/08/2022)   Depression (PHQ2-9)    PHQ-2 Score: 0  Alcohol Screen: Not on file  Housing: Not on file  Utilities: Not on file  Health Literacy: Not on file    Allergies: Allergies[1]  Metabolic Disorder Labs: No results found for: HGBA1C, MPG No results found for: PROLACTIN No results found for: CHOL, TRIG, HDL, CHOLHDL, VLDL, LDLCALC No results found for: TSH  Therapeutic Level Labs: No results found for: LITHIUM No results found for: VALPROATE No results found for: CBMZ  Current Medications: Current Outpatient Medications  Medication Sig Dispense Refill   [START ON 06/12/2024] dexmethylphenidate  (FOCALIN  XR) 10 MG 24 hr capsule Take 1 capsule (10 mg total) by mouth 2 (two) times daily. 60 capsule 0   [START ON 06/05/2024] ARIPiprazole  (ABILIFY ) 2 MG tablet Take 1 tablet (2 mg total) by mouth at bedtime. 90 tablet 0   Ascorbic Acid (VITAMIN C) 1000 MG tablet 1 tablet     buPROPion  (WELLBUTRIN  XL) 150 MG 24 hr tablet Take 1 tablet (150 mg total) by mouth daily. Take total of 450 mg daily. Take along with 300 mg tab 90 tablet 1   buPROPion  (WELLBUTRIN  XL) 300 MG 24 hr tablet Take 1 tablet (300 mg total) by mouth daily. Total of 450 mg daily. Take  along with 150 mg tab 90 tablet 1   clonazePAM  (KLONOPIN ) 1 MG tablet Take 1.5 tablets (1.5 mg total) by mouth at bedtime. 45 tablet 1   [START ON 05/30/2024] clonazePAM  (KLONOPIN ) 1 MG tablet Take 1.5 tablets (1.5 mg total) by mouth at bedtime as needed for anxiety. 45 tablet 0   dexmethylphenidate  (FOCALIN  XR) 10 MG 24 hr capsule Take 1 capsule (10 mg total) by mouth 2 (two) times daily. 60 capsule 0   levothyroxine  (SYNTHROID ) 25 MCG tablet Take 75 mcg by mouth daily before breakfast.     methylphenidate  (RITALIN ) 20 MG  tablet Take 1 tablet (20 mg total) by mouth daily at 12 noon. (Patient not taking: Reported on 05/03/2024) 30 tablet 0   Multiple Vitamin (MULTIVITAMIN ADULT) TABS 1 tablet     [START ON 06/08/2024] sertraline  (ZOLOFT ) 100 MG tablet Take 2 tablets (200 mg total) by mouth at bedtime. 180 tablet 1   No current facility-administered medications for this visit.     Musculoskeletal: Strength & Muscle Tone: within normal limits Gait & Station: normal Patient leans: N/A  Psychiatric Specialty Exam: Review of Systems  Psychiatric/Behavioral:  Positive for decreased concentration and sleep disturbance. Negative for agitation, behavioral problems, confusion, dysphoric mood, hallucinations, self-injury and suicidal ideas. The patient is nervous/anxious. The patient is not hyperactive.   All other systems reviewed and are negative.   There were no vitals taken for this visit.There is no height or weight on file to calculate BMI.  General Appearance: Well Groomed  Eye Contact:  Good  Speech:  Clear and Coherent  Volume:  Normal  Mood:  Anxious  Affect:  Appropriate, Congruent, and slightly tense  Thought Process:  Coherent  Orientation:  Full (Time, Place, and Person)  Thought Content: Logical   Suicidal Thoughts:  No  Homicidal Thoughts:  No  Memory:  Immediate;   Good  Judgement:  Good  Insight:  Good  Psychomotor Activity:  Normal  Concentration:  Concentration: Good and  Attention Span: Good  Recall:  Good  Fund of Knowledge: Good  Language: Good  Akathisia:  No  Handed:  Right  AIMS (if indicated): not done  Assets:  Communication Skills Desire for Improvement  ADL's:  Intact  Cognition: WNL  Sleep:  Poor   Screenings: PHQ2-9    Flowsheet Row Office Visit from 08/08/2022 in [facility] Video Visit from 10/20/2020 in [facility] Video Visit from 07/25/2020 in [facility]  PHQ-2 Total Score 0 0 0   Flowsheet Row Video Visit from 01/03/2021 in [facility] Video Visit from 10/20/2020 in [facility] ED to Hosp-Admission (Discharged) from 09/06/2020 in [facility]  C-SSRS RISK CATEGORY Low Risk No Risk No Risk     Assessment and Plan:  Jon Beck is a 44  year old male with a history of depression,  ADHD,  hypertension,  hyperlipidemia, hypothyroidism, obesity , who presents for follow up appointment for below.   1. MDD (major depressive disorder), recurrent, in partial remission 2. GAD (generalized anxiety disorder) History: depression, anxiety, ADHD (reportedly had a test) since 44,    He reports slight worsening in anxiety due to work-related stress.  However, it has been overall manageable and he denies significant concern about his mood at this time.  Will continue current dose of sertraline  to target depression and anxiety.  Will continue bupropion  and Abilify  as adjunctive treatment for depression.  Noted that he attributes weight gain to his current diet and lack of exercise; he is willing to work on these.  Although he is unable to do in person visit today, he is willing to try for the next visit to assess AIMS.  Will continue clonazepam  as needed for anxiety.   3. Attention deficit hyperactivity disorder (ADHD),  unspecified ADHD type [F90.9] -  He reportedly had 3 psychological testing, first at age 38.     He reports slight difficulty in inattention.  He reports good benefit from taking  higher dose of Ritalin  around lunchtime.  He expressed understanding not to self adjust moving forward.  Will continue current dose of Focalin  and uptitrate Ritalin  at this time to target ADHD.  Discussed potential risk of insomnia, worsening in anxiety, hypertension, tachycardia.  He agrees to monitor home BP.   4. Long term prescription benzodiazepine use -Worsening in depression/anxiety in the context of tapering down clonazepam  from 1.5 mg to 1 mg, tapered down from 2 mg in Feb 2024 - UDS positive for cannabinoid, 06/2023 Although he is willing to taper down clonazepam , he had worsening in his mood symptoms when tried to taper down.  Will continue current dose of clonazepam  to target anxiety.    4. Insomnia, unspecified type He will proceed with HST.  Of note, he started to work on diet and exercise again, which hopefully helps for weight loss.     5. High risk medication use # CBD/THC use  Cannabinoid positive 06/2023 Discussed possible negative impact on both his cognition and stimulant.  Will continue motivational interview.        Last checked  EKG HR 98, QTc426 msec 06/2022  Lipid panels  Chol 249 H, TG 126, LDL 181 H 07/2023  HbA1c  Glu 98 07/2023     Plan Continue sertraline  200 mg daily Continue bupropion  450 mg daily (150 mg + 300 mg )  Continue Abilify  2 mg at night (started 11/2022) Continue clonazepam  1.5  mg at night as needed for anxiety  Continue Focalin  XR 10 mg twice a daily  Increase Ritalin  30 mg at lunch time (u1/2026) - he declined a refill Referred for evaluation of sleep apnea Next appointment:3/10 at 8 am, IP - on levothyroxine  75 mcg, uptitrated from 50 mcg - vitamin, testosterone    Past trials of medication: sertraline , Effexor (had zaps), Wellbutrin , Adderall, Adderall XR,  Vyvanse , Concerta , Strattera (limited benefit),    The patient demonstrates the following risk factors for suicide: Chronic risk factors for suicide include: psychiatric disorder of depression. Acute risk factors for suicide include: N/A. Protective factors for this patient include: positive social support, coping skills and hope for the future. Considering these factors, the overall suicide risk at this point appears to be low. Patient is appropriate for outpatient follow up., who presents for follow up appointment for below.     Collaboration of Care: Collaboration of Care: Other reviewed notes in Epic  Patient/Guardian was advised Release of Information must be obtained prior to any record release in order to collaborate their care with an outside provider. Patient/Guardian was advised if they have not already done so to contact the registration department to sign all necessary forms in order for us  to release information regarding their care.   Consent: Patient/Guardian gives verbal consent for treatment and assignment of benefits for services provided during this visit. Patient/Guardian expressed understanding and agreed to proceed.    Katheren Sleet, MD 05/13/2024, 8:37 AM     [1] No Known Allergies

## 2024-05-13 ENCOUNTER — Telehealth (INDEPENDENT_AMBULATORY_CARE_PROVIDER_SITE_OTHER): Admitting: Psychiatry

## 2024-05-13 ENCOUNTER — Encounter: Payer: Self-pay | Admitting: Psychiatry

## 2024-05-13 ENCOUNTER — Ambulatory Visit: Admitting: Neurology

## 2024-05-13 DIAGNOSIS — F3341 Major depressive disorder, recurrent, in partial remission: Secondary | ICD-10-CM

## 2024-05-13 DIAGNOSIS — F419 Anxiety disorder, unspecified: Secondary | ICD-10-CM

## 2024-05-13 DIAGNOSIS — E66813 Obesity, class 3: Secondary | ICD-10-CM

## 2024-05-13 DIAGNOSIS — Z9189 Other specified personal risk factors, not elsewhere classified: Secondary | ICD-10-CM

## 2024-05-13 DIAGNOSIS — F909 Attention-deficit hyperactivity disorder, unspecified type: Secondary | ICD-10-CM | POA: Diagnosis not present

## 2024-05-13 DIAGNOSIS — F411 Generalized anxiety disorder: Secondary | ICD-10-CM

## 2024-05-13 DIAGNOSIS — F519 Sleep disorder not due to a substance or known physiological condition, unspecified: Secondary | ICD-10-CM

## 2024-05-13 DIAGNOSIS — R0683 Snoring: Secondary | ICD-10-CM

## 2024-05-13 DIAGNOSIS — F19982 Other psychoactive substance use, unspecified with psychoactive substance-induced sleep disorder: Secondary | ICD-10-CM

## 2024-05-13 MED ORDER — SERTRALINE HCL 100 MG PO TABS: 200.0000 mg | ORAL_TABLET | Freq: Every day | ORAL | 1 refills | Status: AC

## 2024-05-13 MED ORDER — CLONAZEPAM 1 MG PO TABS: 1.5000 mg | ORAL_TABLET | Freq: Every evening | ORAL | 0 refills | Status: AC | PRN

## 2024-05-13 MED ORDER — ARIPIPRAZOLE 2 MG PO TABS: 2.0000 mg | ORAL_TABLET | Freq: Every day | ORAL | 0 refills | Status: AC

## 2024-05-13 MED ORDER — DEXMETHYLPHENIDATE HCL ER 10 MG PO CP24: 10.0000 mg | ORAL_CAPSULE | Freq: Two times a day (BID) | ORAL | 0 refills | Status: AC

## 2024-05-13 NOTE — Patient Instructions (Addendum)
 Continue sertraline  200 mg daily Continue bupropion  450 mg daily (150 mg + 300 mg )  Continue Abilify  2 mg at night Continue clonazepam  1.5  mg at night as needed for anxiety  Continue Focalin  XR 10 mg twice in AM 20 mg  Increase Ritalin  30 mg at lunch time  Referred for evaluation of sleep apnea Next appointment:3/10 at 8 am

## 2024-05-26 NOTE — Progress Notes (Unsigned)
 Jon Beck

## 2024-05-28 ENCOUNTER — Telehealth: Payer: Self-pay

## 2024-05-28 ENCOUNTER — Other Ambulatory Visit: Payer: Self-pay | Admitting: Psychiatry

## 2024-05-28 MED ORDER — METHYLPHENIDATE HCL 20 MG PO TABS: 20.0000 mg | ORAL_TABLET | Freq: Every day | ORAL | 0 refills | Status: AC

## 2024-05-28 MED ORDER — METHYLPHENIDATE HCL 10 MG PO TABS: 10.0000 mg | ORAL_TABLET | Freq: Every day | ORAL | 0 refills | Status: AC

## 2024-05-28 NOTE — Telephone Encounter (Signed)
 Ordered. Please note that I sent methylphenidate  20 mg tab and 10 mg tab to take total of 30 mg per day.

## 2024-05-28 NOTE — Telephone Encounter (Signed)
 Pt left Voicemail requesting methylphenidate  Expired - methylphenidate  (RITALIN ) 20 MG table Pharmacy:[id number] - , [place] - [address] [facility] [address]   Last seen: Next apt:  Pt is asking for a refill of 45 instead of 30 as he stated he was told he could take 1 and a half

## 2024-06-29 ENCOUNTER — Ambulatory Visit: Admitting: Psychiatry
